# Patient Record
Sex: Male | Born: 1945 | Race: White | Hispanic: No | Marital: Married | Smoking: Current every day smoker
Health system: Southern US, Community
[De-identification: ages and names within clinical notes are randomized; demographics above are authoritative.]

## PROBLEM LIST (undated history)

## (undated) DIAGNOSIS — R410 Disorientation, unspecified: Secondary | ICD-10-CM

## (undated) DIAGNOSIS — R0600 Dyspnea, unspecified: Secondary | ICD-10-CM

## (undated) DIAGNOSIS — I10 Essential (primary) hypertension: Secondary | ICD-10-CM

## (undated) DIAGNOSIS — R0609 Other forms of dyspnea: Secondary | ICD-10-CM

## (undated) DIAGNOSIS — C61 Malignant neoplasm of prostate: Secondary | ICD-10-CM

## (undated) DIAGNOSIS — K219 Gastro-esophageal reflux disease without esophagitis: Secondary | ICD-10-CM

## (undated) DIAGNOSIS — J189 Pneumonia, unspecified organism: Secondary | ICD-10-CM

## (undated) HISTORY — PX: TONSILLECTOMY: SUR1361

## (undated) HISTORY — PX: PROSTATE SURGERY: SHX751

---

## 1998-06-27 ENCOUNTER — Emergency Department (HOSPITAL_COMMUNITY): Admission: EM | Admit: 1998-06-27 | Discharge: 1998-06-27 | Payer: Self-pay | Admitting: Emergency Medicine

## 1998-06-27 ENCOUNTER — Encounter: Payer: Self-pay | Admitting: Emergency Medicine

## 1998-07-17 ENCOUNTER — Emergency Department (HOSPITAL_COMMUNITY): Admission: EM | Admit: 1998-07-17 | Discharge: 1998-07-17 | Payer: Self-pay | Admitting: Emergency Medicine

## 1999-01-18 ENCOUNTER — Emergency Department (HOSPITAL_COMMUNITY): Admission: EM | Admit: 1999-01-18 | Discharge: 1999-01-18 | Payer: Self-pay | Admitting: Emergency Medicine

## 2000-11-05 ENCOUNTER — Other Ambulatory Visit: Admission: RE | Admit: 2000-11-05 | Discharge: 2000-11-05 | Payer: Self-pay | Admitting: *Deleted

## 2001-09-16 ENCOUNTER — Encounter: Admission: RE | Admit: 2001-09-16 | Discharge: 2001-09-16 | Payer: Self-pay | Admitting: Family Medicine

## 2001-09-16 ENCOUNTER — Encounter: Payer: Self-pay | Admitting: Family Medicine

## 2002-06-13 ENCOUNTER — Ambulatory Visit (HOSPITAL_COMMUNITY): Admission: RE | Admit: 2002-06-13 | Discharge: 2002-06-13 | Payer: Self-pay | Admitting: Emergency Medicine

## 2002-06-13 ENCOUNTER — Encounter: Payer: Self-pay | Admitting: Emergency Medicine

## 2002-06-13 ENCOUNTER — Emergency Department (HOSPITAL_COMMUNITY): Admission: EM | Admit: 2002-06-13 | Discharge: 2002-06-13 | Payer: Self-pay | Admitting: Emergency Medicine

## 2003-03-29 ENCOUNTER — Emergency Department (HOSPITAL_COMMUNITY): Admission: EM | Admit: 2003-03-29 | Discharge: 2003-03-30 | Payer: Self-pay | Admitting: Emergency Medicine

## 2006-09-29 HISTORY — PX: CATARACT EXTRACTION W/ INTRAOCULAR LENS  IMPLANT, BILATERAL: SHX1307

## 2006-12-15 ENCOUNTER — Ambulatory Visit (HOSPITAL_COMMUNITY): Admission: RE | Admit: 2006-12-15 | Discharge: 2006-12-15 | Payer: Self-pay | Admitting: Family Medicine

## 2008-01-03 ENCOUNTER — Encounter: Admission: RE | Admit: 2008-01-03 | Discharge: 2008-01-03 | Payer: Self-pay | Admitting: Gastroenterology

## 2008-09-29 HISTORY — PX: HYDROCELE EXCISION / REPAIR: SUR1145

## 2009-07-29 ENCOUNTER — Emergency Department (HOSPITAL_COMMUNITY): Admission: EM | Admit: 2009-07-29 | Discharge: 2009-07-29 | Payer: Self-pay | Admitting: Emergency Medicine

## 2011-02-06 ENCOUNTER — Other Ambulatory Visit: Payer: Self-pay | Admitting: Family Medicine

## 2011-02-06 ENCOUNTER — Ambulatory Visit
Admission: RE | Admit: 2011-02-06 | Discharge: 2011-02-06 | Disposition: A | Discharge status: Home / Self Care | Payer: Medicare Other | Source: Ambulatory Visit | Attending: Family Medicine | Admitting: Family Medicine

## 2011-02-06 DIAGNOSIS — R140 Abdominal distension (gaseous): Secondary | ICD-10-CM

## 2011-02-06 DIAGNOSIS — R6881 Early satiety: Secondary | ICD-10-CM

## 2011-02-06 MED ORDER — IOHEXOL 300 MG/ML  SOLN
100.0000 mL | Freq: Once | INTRAMUSCULAR | Status: AC | PRN
  Administered 2011-02-06: 100 mL via INTRAVENOUS

## 2011-07-20 ENCOUNTER — Emergency Department (HOSPITAL_COMMUNITY): Payer: Medicare Other

## 2011-07-20 ENCOUNTER — Emergency Department (HOSPITAL_COMMUNITY)
Admission: EM | Admit: 2011-07-20 | Discharge: 2011-07-20 | Disposition: A | Discharge status: Home / Self Care | Payer: Medicare Other | Attending: Emergency Medicine | Admitting: Emergency Medicine

## 2011-07-20 DIAGNOSIS — K219 Gastro-esophageal reflux disease without esophagitis: Secondary | ICD-10-CM | POA: Insufficient documentation

## 2011-07-20 DIAGNOSIS — M109 Gout, unspecified: Secondary | ICD-10-CM | POA: Insufficient documentation

## 2011-07-20 DIAGNOSIS — R1013 Epigastric pain: Secondary | ICD-10-CM | POA: Insufficient documentation

## 2011-07-20 LAB — COMPREHENSIVE METABOLIC PANEL
ALT: 30 U/L (ref 0–53)
CO2: 24 mEq/L (ref 19–32)
Calcium: 10.3 mg/dL (ref 8.4–10.5)
Chloride: 99 mEq/L (ref 96–112)
Creatinine, Ser: 0.83 mg/dL (ref 0.50–1.35)
GFR calc Af Amer: >90 mL/min (ref >90)
GFR calc non Af Amer: 90 mL/min — ABNORMAL LOW (ref >90)
Glucose, Bld: 181 mg/dL — ABNORMAL HIGH (ref 70–99)
Sodium: 138 mEq/L (ref 135–145)
Total Bilirubin: 0.6 mg/dL (ref 0.3–1.2)

## 2011-07-20 LAB — DIFFERENTIAL
Basophils Absolute: 0.1 10*3/uL (ref 0.0–0.1)
Basophils Relative: 1 % (ref 0–1)
Eosinophils Relative: 1 % (ref 0–5)
Monocytes Absolute: 0.9 10*3/uL (ref 0.1–1.0)

## 2011-07-20 LAB — CBC
MCH: 36.6 pg — ABNORMAL HIGH (ref 26.0–34.0)
MCHC: 35.9 g/dL (ref 30.0–36.0)
RDW: 12.5 % (ref 11.5–15.5)

## 2011-07-20 LAB — URINALYSIS, ROUTINE W REFLEX MICROSCOPIC
Glucose, UA: NEGATIVE mg/dL
Hgb urine dipstick: NEGATIVE
Ketones, ur: NEGATIVE mg/dL
Protein, ur: NEGATIVE mg/dL
Urobilinogen, UA: 0.2 mg/dL (ref 0.0–1.0)

## 2011-07-20 LAB — PROTIME-INR: Prothrombin Time: 14.4 seconds (ref 11.6–15.2)

## 2011-07-20 LAB — TROPONIN I: Troponin I: <0.3 ng/mL (ref <0.30)

## 2011-07-20 MED ORDER — IOHEXOL 300 MG/ML  SOLN
100.0000 mL | Freq: Once | INTRAMUSCULAR | Status: AC | PRN
  Administered 2011-07-20: 100 mL via INTRAVENOUS

## 2011-10-08 ENCOUNTER — Ambulatory Visit (INDEPENDENT_AMBULATORY_CARE_PROVIDER_SITE_OTHER): Payer: Medicare Other | Admitting: General Surgery

## 2011-10-08 ENCOUNTER — Encounter (INDEPENDENT_AMBULATORY_CARE_PROVIDER_SITE_OTHER): Payer: Self-pay | Admitting: General Surgery

## 2011-10-08 VITALS — BP 142/92 | HR 70 | Temp 98.6°F | Resp 16 | Ht 68.0 in | Wt 167.4 lb

## 2011-10-08 DIAGNOSIS — K4090 Unilateral inguinal hernia, without obstruction or gangrene, not specified as recurrent: Secondary | ICD-10-CM | POA: Insufficient documentation

## 2011-10-08 NOTE — Progress Notes (Signed)
Patient ID: Gary [name], male   DOB: Mar 18, 1946, 66 y.o.   MRN: [id number]  Chief Complaint  Patient presents with  . Inguinal Hernia    right    HPI Gary [name] is a 66 y.o. male.   HPIPatient has a one-year history of right inguinal hernia. Sometime ago, he had evaluated at [facility] in [place]. He did not want to have surgery at that time. The hernia goes in and out during the day. He does goes back and spontaneously at night. He has mild discomfort in the area but no pain. He has had no change in bowel or bladder habits. PRS to see him by Dr. Andrey Campanile regarding consideration for repair  Past Medical History  Diagnosis Date  . Cancer     prostate    Past Surgical History  Procedure Date  . Prostate surgery     History reviewed. No pertinent family history.  Social History History  Substance Use Topics  . Smoking status: Current Everyday Smoker  . Smokeless tobacco: Not on file  . Alcohol Use: 3.6 oz/week    6 Cans of beer per week    No Known Allergies  No current outpatient prescriptions on file.    Review of Systems Review of Systems  Constitutional: Negative for fever, chills and unexpected weight change.  HENT: Negative for hearing loss, congestion, sore throat, trouble swallowing and voice change.   Eyes: Negative for visual disturbance.  Respiratory: Negative for cough and wheezing.   Cardiovascular: Negative for chest pain, palpitations and leg swelling.  Gastrointestinal: Negative for nausea, vomiting, abdominal pain, diarrhea, constipation, blood in stool, abdominal distention, anal bleeding and rectal pain.       Right inguinal hernia  Genitourinary: Negative for hematuria and difficulty urinating.       Status post prostatectomy  Musculoskeletal: Negative for arthralgias.  Skin: Negative for rash and wound.  Neurological: Negative for seizures, syncope, weakness and headaches.  Hematological: Negative for adenopathy. Does not bruise/bleed  easily.  Psychiatric/Behavioral: Negative for confusion.    Blood pressure 142/92, pulse 70, temperature 98.6 F (37 C), temperature source Temporal, resp. rate 16, height 5\' 8"  (1.727 m), weight 167 lb 6 oz (75.921 kg).  Physical Exam Physical Exam  Constitutional: He appears well-developed and well-nourished. No distress.  HENT:  Head: Normocephalic and atraumatic.  Eyes: Conjunctivae and EOM are normal. Pupils are equal, round, and reactive to light.  Neck: Normal range of motion. Neck supple.  Cardiovascular: Normal rate, regular rhythm and normal heart sounds.   Pulmonary/Chest: Effort normal and breath sounds normal. No respiratory distress. He has no wheezes. He has no rales.  Abdominal: Soft. Bowel sounds are normal. He exhibits no distension. There is no tenderness. There is no rebound and no guarding.       Healed lower midline incision, right inguinal hernia is present. This reduces without pain.it does not go into the scrotum. Both testes are descended without swelling. There is no left hernia    Data Reviewed    Assessment    Right inguinal hernia    Plan    I have offered right inguinal hernia repair with mesh. Procedure, risks, and benefits of the procedure were discussed with the patient. We discussed recurrence rates and other questions are answered. I encouraged him to quit smoking. He would like to schedule surgery.       Cyenna Rebello E 10/08/2011, 10:51 AM

## 2011-11-24 ENCOUNTER — Encounter (HOSPITAL_COMMUNITY): Payer: Self-pay | Admitting: Pharmacy Technician

## 2011-11-27 NOTE — Pre-Procedure Instructions (Deleted)
20 Gary [name]  11/27/2011   Your procedure is scheduled on:  Monday December 08, 2011 at 0730 am.  Report to [facility] at 0530 AM.  Call this number if you have problems the morning of surgery: [phone]   Remember:   Do not eat food:After Midnight.  May have clear liquids: up to 4 Hours before arrival until 0130 am.  Clear liquids include soda, tea, black coffee, apple or grape juice, broth.  Take these medicines the morning of surgery with A SIP OF WATER: Omeprazole   Do not wear jewelry, make-up or nail polish.  Do not wear lotions, powders, or perfumes. You may wear deodorant.  Do not shave 48 hours prior to surgery.  Do not bring valuables to the hospital.  Contacts, dentures or bridgework may not be worn into surgery.  Leave suitcase in the car. After surgery it may be brought to your room.  For patients admitted to the hospital, checkout time is 11:00 AM the day of discharge.   Patients discharged the day of surgery will not be allowed to drive home.  Name and phone number of your driver:   Special Instructions: CHG Shower Use Special Wash: 1/2 bottle night before surgery and 1/2 bottle morning of surgery.   Please read over the following fact sheets that you were given: Pain Booklet, Coughing and Deep Breathing and Surgical Site Infection Prevention

## 2011-11-28 ENCOUNTER — Encounter (HOSPITAL_COMMUNITY): Payer: Self-pay

## 2011-11-28 ENCOUNTER — Encounter (HOSPITAL_COMMUNITY)
Admission: RE | Admit: 2011-11-28 | Discharge: 2011-11-28 | Disposition: A | Discharge status: Home / Self Care | Payer: Medicare Other | Source: Ambulatory Visit | Attending: General Surgery | Admitting: General Surgery

## 2011-11-28 HISTORY — DX: Essential (primary) hypertension: I10

## 2011-11-28 HISTORY — DX: Pneumonia, unspecified organism: J18.9

## 2011-11-28 HISTORY — DX: Gastro-esophageal reflux disease without esophagitis: K21.9

## 2011-11-28 LAB — CBC
HCT: 46.8 % (ref 39.0–52.0)
Hemoglobin: 16.9 g/dL (ref 13.0–17.0)
MCH: 34.9 pg — ABNORMAL HIGH (ref 26.0–34.0)
MCV: 96.7 fL (ref 78.0–100.0)
RBC: 4.84 MIL/uL (ref 4.22–5.81)
WBC: 5.7 10*3/uL (ref 4.0–10.5)

## 2011-11-28 LAB — DIFFERENTIAL
Basophils Absolute: 0.1 10*3/uL (ref 0.0–0.1)
Basophils Relative: 2 % — ABNORMAL HIGH (ref 0–1)
Eosinophils Relative: 6 % — ABNORMAL HIGH (ref 0–5)
Lymphocytes Relative: 28 % (ref 12–46)
Monocytes Absolute: 0.8 10*3/uL (ref 0.1–1.0)
Neutro Abs: 2.9 10*3/uL (ref 1.7–7.7)

## 2011-11-28 LAB — COMPREHENSIVE METABOLIC PANEL
AST: 38 U/L — ABNORMAL HIGH (ref 0–37)
CO2: 25 mEq/L (ref 19–32)
Calcium: 10.4 mg/dL (ref 8.4–10.5)
Chloride: 99 mEq/L (ref 96–112)
Creatinine, Ser: 0.95 mg/dL (ref 0.50–1.35)
GFR calc Af Amer: >90 mL/min (ref >90)
GFR calc non Af Amer: 85 mL/min — ABNORMAL LOW (ref >90)
Glucose, Bld: 108 mg/dL — ABNORMAL HIGH (ref 70–99)
Total Bilirubin: 0.9 mg/dL (ref 0.3–1.2)

## 2011-11-28 LAB — PROTIME-INR: Prothrombin Time: 13.4 seconds (ref 11.6–15.2)

## 2011-11-28 LAB — SURGICAL PCR SCREEN: Staphylococcus aureus: NEGATIVE

## 2011-11-28 NOTE — Pre-Procedure Instructions (Addendum)
Gary [name]  11/28/2011   Your procedure is scheduled on:3.11.13  Report to [facility] at 530 AM.  Call this number if you have problems the morning of surgery: [phone]   Remember:   Do not eat food:After Midnight.  May have clear liquids: up to 4 Hours before arrival. 130 am  Clear liquids include soda, tea, black coffee, apple or grape juice, broth.  Take these medicines the morning of surgery with A SIP OF WATER:omeprazole STOP aspirin , vit e on the 4th of march   Do not wear jewelry, make-up or nail polish.  Do not wear lotions, powders, or perfumes. You may wear deodorant.  Do not shave 48 hours prior to surgery.  Do not bring valuables to the hospital.  Contacts, dentures or bridgework may not be worn into surgery.  Leave suitcase in the car. After surgery it may be brought to your room.  For patients admitted to the hospital, checkout time is 11:00 AM the day of discharge.   Patients discharged the day of surgery will not be allowed to drive home.  Name and phone number of your driver: [name] [phone]  Cell [phone] work  Special Instructions: CHG Shower Use Special Wash: 1/2 bottle night before surgery and 1/2 bottle morning of surgery.   Please read over the following fact sheets that you were given: Pain Booklet, Coughing and Deep Breathing, MRSA Information and Surgical Site Infection Prevention

## 2011-11-28 NOTE — Progress Notes (Signed)
Stress test done 10-12 yrs ago . Normal  Due to indigestion. Unable to remember where.? wl

## 2011-12-07 MED ORDER — CEFAZOLIN SODIUM-DEXTROSE 2-3 GM-% IV SOLR
2.0000 g | INTRAVENOUS | Status: AC
  Administered 2011-12-08: 2 g via INTRAVENOUS
  Filled 2011-12-07: qty 50

## 2011-12-08 ENCOUNTER — Ambulatory Visit (HOSPITAL_COMMUNITY): Payer: Medicare Other

## 2011-12-08 ENCOUNTER — Ambulatory Visit (HOSPITAL_COMMUNITY)
Admission: RE | Admit: 2011-12-08 | Discharge: 2011-12-08 | Disposition: A | Discharge status: Home / Self Care | Payer: Medicare Other | Source: Ambulatory Visit | Attending: General Surgery | Admitting: General Surgery

## 2011-12-08 ENCOUNTER — Encounter (HOSPITAL_COMMUNITY): Payer: Self-pay | Admitting: Certified Registered"

## 2011-12-08 ENCOUNTER — Encounter (HOSPITAL_COMMUNITY): Payer: Self-pay | Admitting: *Deleted

## 2011-12-08 ENCOUNTER — Ambulatory Visit (HOSPITAL_COMMUNITY): Payer: Medicare Other | Admitting: Certified Registered"

## 2011-12-08 ENCOUNTER — Encounter (HOSPITAL_COMMUNITY)
Admission: RE | Disposition: A | Discharge status: Home / Self Care | Payer: Self-pay | Source: Ambulatory Visit | Attending: General Surgery

## 2011-12-08 DIAGNOSIS — F17200 Nicotine dependence, unspecified, uncomplicated: Secondary | ICD-10-CM | POA: Insufficient documentation

## 2011-12-08 DIAGNOSIS — K219 Gastro-esophageal reflux disease without esophagitis: Secondary | ICD-10-CM | POA: Insufficient documentation

## 2011-12-08 DIAGNOSIS — I10 Essential (primary) hypertension: Secondary | ICD-10-CM | POA: Insufficient documentation

## 2011-12-08 DIAGNOSIS — K4090 Unilateral inguinal hernia, without obstruction or gangrene, not specified as recurrent: Secondary | ICD-10-CM

## 2011-12-08 DIAGNOSIS — Z01812 Encounter for preprocedural laboratory examination: Secondary | ICD-10-CM | POA: Insufficient documentation

## 2011-12-08 HISTORY — PX: INGUINAL HERNIA REPAIR: SHX194

## 2011-12-08 SURGERY — REPAIR, HERNIA, INGUINAL, ADULT
Anesthesia: General | Site: Groin | Laterality: Right | Wound class: Clean

## 2011-12-08 MED ORDER — BUPIVACAINE HCL (PF) 0.25 % IJ SOLN
INTRAMUSCULAR | Status: DC | PRN
  Administered 2011-12-08: 20 mL

## 2011-12-08 MED ORDER — MIDAZOLAM HCL 5 MG/5ML IJ SOLN
INTRAMUSCULAR | Status: DC | PRN
  Administered 2011-12-08: 2 mg via INTRAVENOUS

## 2011-12-08 MED ORDER — LACTATED RINGERS IV SOLN
INTRAVENOUS | Status: DC | PRN
  Administered 2011-12-08: 07:00:00 via INTRAVENOUS

## 2011-12-08 MED ORDER — ACETAMINOPHEN 10 MG/ML IV SOLN
INTRAVENOUS | Status: AC
  Filled 2011-12-08: qty 100

## 2011-12-08 MED ORDER — HYDROMORPHONE HCL PF 1 MG/ML IJ SOLN
0.2500 mg | INTRAMUSCULAR | Status: DC | PRN
Start: 2011-12-08 — End: 2011-12-08

## 2011-12-08 MED ORDER — FENTANYL CITRATE 0.05 MG/ML IJ SOLN
INTRAMUSCULAR | Status: DC | PRN
  Administered 2011-12-08: 75 ug via INTRAVENOUS
  Administered 2011-12-08: 50 ug via INTRAVENOUS

## 2011-12-08 MED ORDER — PROPOFOL 10 MG/ML IV EMUL
INTRAVENOUS | Status: DC | PRN
  Administered 2011-12-08: 200 mg via INTRAVENOUS

## 2011-12-08 MED ORDER — OXYCODONE-ACETAMINOPHEN 5-325 MG PO TABS: 1.0000 | ORAL_TABLET | Freq: Four times a day (QID) | ORAL | Status: AC | PRN

## 2011-12-08 MED ORDER — ONDANSETRON HCL 4 MG/2ML IJ SOLN
INTRAMUSCULAR | Status: DC | PRN
  Administered 2011-12-08: 4 mg via INTRAVENOUS

## 2011-12-08 MED ORDER — PHENYLEPHRINE HCL 10 MG/ML IJ SOLN
INTRAMUSCULAR | Status: DC | PRN
  Administered 2011-12-08 (×6): 0.1 mg via INTRAVENOUS

## 2011-12-08 MED ORDER — DEXAMETHASONE SODIUM PHOSPHATE 4 MG/ML IJ SOLN
INTRAMUSCULAR | Status: DC | PRN
  Administered 2011-12-08: 4 mg via INTRAVENOUS

## 2011-12-08 MED ORDER — 0.9 % SODIUM CHLORIDE (POUR BTL) OPTIME
TOPICAL | Status: DC | PRN
  Administered 2011-12-08: 1000 mL

## 2011-12-08 SURGICAL SUPPLY — 47 items
BLADE SURG 10 STRL SS (BLADE) ×2 IMPLANT
BLADE SURG 15 STRL LF DISP TIS (BLADE) ×1 IMPLANT
BLADE SURG 15 STRL SS (BLADE) ×1
BLADE SURG ROTATE 9660 (MISCELLANEOUS) ×2 IMPLANT
CANISTER SUCTION 2500CC (MISCELLANEOUS) ×2 IMPLANT
CHLORAPREP W/TINT 26ML (MISCELLANEOUS) ×2 IMPLANT
CLOTH BEACON ORANGE TIMEOUT ST (SAFETY) ×4 IMPLANT
COVER SURGICAL LIGHT HANDLE (MISCELLANEOUS) ×2 IMPLANT
DECANTER SPIKE VIAL GLASS SM (MISCELLANEOUS) ×2 IMPLANT
DERMABOND ADVANCED (GAUZE/BANDAGES/DRESSINGS) ×1
DERMABOND ADVANCED .7 DNX12 (GAUZE/BANDAGES/DRESSINGS) ×1 IMPLANT
DRAIN PENROSE 1/2X12 LTX STRL (WOUND CARE) ×2 IMPLANT
DRAPE PED LAPAROTOMY (DRAPES) ×2 IMPLANT
DRAPE UTILITY 15X26 W/TAPE STR (DRAPE) ×4 IMPLANT
ELECT CAUTERY BLADE 6.4 (BLADE) ×2 IMPLANT
ELECT REM PT RETURN 9FT ADLT (ELECTROSURGICAL) ×2
ELECTRODE REM PT RTRN 9FT ADLT (ELECTROSURGICAL) ×1 IMPLANT
GLOVE BIO SURGEON STRL SZ8 (GLOVE) ×2 IMPLANT
GLOVE BIOGEL PI IND STRL 8 (GLOVE) ×1 IMPLANT
GLOVE BIOGEL PI INDICATOR 8 (GLOVE) ×1
GOWN PREVENTION PLUS XLARGE (GOWN DISPOSABLE) ×2 IMPLANT
GOWN STRL NON-REIN LRG LVL3 (GOWN DISPOSABLE) ×2 IMPLANT
KIT BASIN OR (CUSTOM PROCEDURE TRAY) ×2 IMPLANT
KIT ROOM TURNOVER OR (KITS) ×2 IMPLANT
MESH HERNIA 3X6 (Mesh General) ×2 IMPLANT
NEEDLE HYPO 25GX1X1/2 BEV (NEEDLE) ×2 IMPLANT
NS IRRIG 1000ML POUR BTL (IV SOLUTION) ×2 IMPLANT
PACK SURGICAL SETUP 50X90 (CUSTOM PROCEDURE TRAY) ×2 IMPLANT
PAD ARMBOARD 7.5X6 YLW CONV (MISCELLANEOUS) ×4 IMPLANT
PENCIL BUTTON HOLSTER BLD 10FT (ELECTRODE) ×2 IMPLANT
PLUG ATRIUM AND PATCH X LRG (MISCELLANEOUS) IMPLANT
SPECIMEN JAR SMALL (MISCELLANEOUS) IMPLANT
SPONGE LAP 18X18 X RAY DECT (DISPOSABLE) ×2 IMPLANT
SUT MNCRL AB 4-0 PS2 18 (SUTURE) ×2 IMPLANT
SUT PROLENE 2 0 CT2 30 (SUTURE) ×6 IMPLANT
SUT VIC AB 2-0 SH 27 (SUTURE) ×2
SUT VIC AB 2-0 SH 27X BRD (SUTURE) ×2 IMPLANT
SUT VIC AB 3-0 SH 27 (SUTURE) ×1
SUT VIC AB 3-0 SH 27X BRD (SUTURE) ×1 IMPLANT
SUT VICRYL AB 3 0 TIES (SUTURE) IMPLANT
SYR BULB IRRIGATION 50ML (SYRINGE) ×2 IMPLANT
SYR CONTROL 10ML LL (SYRINGE) ×2 IMPLANT
TOWEL OR 17X24 6PK STRL BLUE (TOWEL DISPOSABLE) ×2 IMPLANT
TOWEL OR 17X26 10 PK STRL BLUE (TOWEL DISPOSABLE) ×2 IMPLANT
TUBE CONNECTING 12X1/4 (SUCTIONS) ×2 IMPLANT
WATER STERILE IRR 1000ML POUR (IV SOLUTION) IMPLANT
YANKAUER SUCT BULB TIP NO VENT (SUCTIONS) ×2 IMPLANT

## 2011-12-08 NOTE — Addendum Note (Signed)
Addendum  created 12/08/11 0944 by Wilder Glade, CRNA   Modules edited:Anesthesia Events

## 2011-12-08 NOTE — Anesthesia Preprocedure Evaluation (Signed)
Anesthesia Evaluation  Patient identified by MRN, date of birth, ID band Patient awake    Reviewed: Allergy & Precautions, H&P , NPO status , Patient's Chart, lab work & pertinent test results  Airway Mallampati: II TM Distance: >3 FB Neck ROM: Full    Dental No notable dental hx. (+) Teeth Intact and Dental Advisory Given   Pulmonary neg pulmonary ROS,  breath sounds clear to auscultation  Pulmonary exam normal       Cardiovascular hypertension, On Medications Rhythm:Regular Rate:Normal     Neuro/Psych negative neurological ROS  negative psych ROS   GI/Hepatic Neg liver ROS, GERD-  Medicated and Controlled,  Endo/Other  negative endocrine ROS  Renal/GU negative Renal ROS  negative genitourinary   Musculoskeletal   Abdominal   Peds  Hematology negative hematology ROS (+)   Anesthesia Other Findings   Reproductive/Obstetrics negative OB ROS                           Anesthesia Physical Anesthesia Plan  ASA: II  Anesthesia Plan: General   Post-op Pain Management:    Induction: Intravenous  Airway Management Planned: LMA  Additional Equipment:   Intra-op Plan:   Post-operative Plan: Extubation in OR  Informed Consent: I have reviewed the patients History and Physical, chart, labs and discussed the procedure including the risks, benefits and alternatives for the proposed anesthesia with the patient or authorized representative who has indicated his/her understanding and acceptance.   Dental advisory given  Plan Discussed with: CRNA  Anesthesia Plan Comments:         Anesthesia Quick Evaluation  

## 2011-12-08 NOTE — H&P (Signed)
  HPI  Gary [name] is a 66 y.o. male.  HPIPatient has a one-year history of right inguinal hernia. Sometime ago, he had evaluated at [facility] in [place]. He did not want to have surgery at that time. The hernia goes in and out during the day. He does goes back and spontaneously at night. He has mild discomfort in the area but no pain. He has had no change in bowel or bladder habits. PRS to see him by Dr. Andrey Campanile regarding consideration for repair  Past Medical History   Diagnosis  Date   .  Cancer      prostate    Past Surgical History   Procedure  Date   .  Prostate surgery     History reviewed. No pertinent family history.  Social History  History   Substance Use Topics   .  Smoking status:  Current Everyday Smoker   .  Smokeless tobacco:  Not on file   .  Alcohol Use:  3.6 oz/week     6 Cans of beer per week    No Known Allergies  No current outpatient prescriptions on file.    Review of Systems  Review of Systems  Constitutional: Negative for fever, chills and unexpected weight change.  HENT: Negative for hearing loss, congestion, sore throat, trouble swallowing and voice change.  Eyes: Negative for visual disturbance.  Respiratory: Negative for cough and wheezing.  Cardiovascular: Negative for chest pain, palpitations and leg swelling.  Gastrointestinal: Negative for nausea, vomiting, abdominal pain, diarrhea, constipation, blood in stool, abdominal distention, anal bleeding and rectal pain.  Right inguinal hernia  Genitourinary: Negative for hematuria and difficulty urinating.  Status post prostatectomy  Musculoskeletal: Negative for arthralgias.  Skin: Negative for rash and wound.  Neurological: Negative for seizures, syncope, weakness and headaches.  Hematological: Negative for adenopathy. Does not bruise/bleed easily.  Psychiatric/Behavioral: Negative for confusion.   Blood pressure 142/92, pulse 70, temperature 98.6 F (37 C), temperature source Temporal,  resp. rate 16, height 5\' 8"  (1.727 m), weight 167 lb 6 oz (75.921 kg).  Physical Exam  Physical Exam  Constitutional: He appears well-developed and well-nourished. No distress.  HENT:  Head: Normocephalic and atraumatic.  Eyes: Conjunctivae and EOM are normal. Pupils are equal, round, and reactive to light.  Neck: Normal range of motion. Neck supple.  Cardiovascular: Normal rate, regular rhythm and normal heart sounds.  Pulmonary/Chest: Effort normal and breath sounds normal. No respiratory distress. He has no wheezes. He has no rales.  Abdominal: Soft. Bowel sounds are normal. He exhibits no distension. There is no tenderness. There is no rebound and no guarding.  Healed lower midline incision, right inguinal hernia is present. This reduces without pain.it does not go into the scrotum. Both testes are descended without swelling. There is no left hernia   Data Reviewed  Assessment   Right inguinal hernia   Plan   I have offered right inguinal hernia repair with mesh. Procedure, risks, and benefits of the procedure were discussed with the patient. We discussed recurrence rates and other questions are answered. I encouraged him to quit smoking. He would like to schedule surgery.    Violeta Gelinas, MD, MPH, FACS Pager: [phone]

## 2011-12-08 NOTE — Preoperative (Signed)
Beta Blockers   Reason not to administer Beta Blockers:Not Applicable 

## 2011-12-08 NOTE — Addendum Note (Signed)
Addendum  created 12/08/11 0941 by Wilder Glade, CRNA   Modules edited:Anesthesia Events

## 2011-12-08 NOTE — Anesthesia Postprocedure Evaluation (Signed)
  Anesthesia Post-op Note  Patient: Gary [name]  Procedure(s) Performed: Procedure(s) (LRB): HERNIA REPAIR INGUINAL ADULT (Right) INSERTION OF MESH (Right)  Patient Location: PACU  Anesthesia Type: General  Level of Consciousness: awake, alert  and oriented  Airway and Oxygen Therapy: Patient Spontanous Breathing  Post-op Pain: none  Post-op Assessment: Post-op Vital signs reviewed, Patient's Cardiovascular Status Stable, Respiratory Function Stable and Patent Airway  Post-op Vital Signs: Reviewed and stable  Complications: No apparent anesthesia complications

## 2011-12-08 NOTE — Op Note (Signed)
12/08/2011  8:41 AM  PATIENT:  Gary [name]  66 y.o. male  PRE-OPERATIVE DIAGNOSIS:  right inguinal hernia  POST-OPERATIVE DIAGNOSIS:  right inguinal hernia  PROCEDURE:  Procedure(s): HERNIA REPAIR INGUINAL ADULT INSERTION OF MESH  SURGEON:  Surgeon(s): Liz Malady, MD  PHYSICIAN ASSISTANT:   ASSISTANTS: none   ANESTHESIA:   general  EBL:     BLOOD ADMINISTERED:none  DRAINS: none   SPECIMEN:  No Specimen  DISPOSITION OF SPECIMEN:  N/A  COUNTS:  YES  DICTATION: .Dragon Dictation patient presents for repair of right inguinal hernia. He was identified in the preop holding area. His site was marked. He received intravenous antibiotics. He was brought to the operating room and general anesthesia with laryngeal mask airway was administered by the anesthesia staff. His abdomen and groins were prepped and draped in sterile fashion. We did time out procedure. Local anesthetic was injected towards the right anterior superior iliac spine and along the planned line of right groin incision. Right groin incision was made. Subcutaneous tissues were dissected down through Scarpa's fascia. Hemostasis was obtained with Bovie cautery. External oblique fascia was exposed. This was divided laterally in the division was continued sharply through the external ring. Superior leaflet of the fascia was dissected free off the transversalis. Inferior leaflet was dissected down revealing the shelving edge of the inguinal ligament. Cord structures were encircled with a Penrose drain. Flow was inspected there was no direct hernia. Cord structures were then dissected revealing a moderate-sized indirect hernia sac. The sac was dissected free from the cord structures. It was then high ligated with 2-0 Vicryl. It was excised and discarded. A small cord lipoma was also noted which was removed with cautery. Next the hernia repair was completed with a polypropylene mesh cut to a keyhole shape. This was tacked to  the tissues over the pubic tubercle medially with 2-0 Prolene. It was attached to the shelving edge of inguinal ligament inferiorly with running 2-0 Prolene. Superior portion was tacked with interrupted 2-0 Prolene again the tissues over the pubic tubercle and then along the transversalis. The 2 leaflets of the mesh were rejoined behind the cord and tacked together and to underlying musculature with 2-0 Prolene suture again an interrupted fashion. The aperture in the mesh was adjusted to the admitted the tip of the fifth digit. Cord structures were viable and nonedematous. Area was irrigated. Meticulous hemostasis was ensured. Additional local anesthetic was injected. External oblique fascia was closed with running 2-0 Vicryl suture. Subcutaneous tissues were irrigated and then approximated with interrupted 3-0 Vicryl sutures. Skin was closed with running 4-0 Monocryl subcuticular stitch followed by Dermabond. Sponge needle and as we counts were all correct. Patient tolerated procedure well without apparent complication was taken recovery in stable condition. At the completion of the procedure, his right testicle was returned to anatomic position in the scrotum.  PATIENT DISPOSITION:  PACU - hemodynamically stable.   Delay start of Pharmacological VTE agent (>24hrs) due to surgical blood loss or risk of bleeding:  no  Violeta Gelinas, MD, MPH, FACS Pager: [phone]  3/11/20138:41 AM

## 2011-12-08 NOTE — Interval H&P Note (Signed)
History and Physical Interval Note:  12/08/2011 7:12 AM  Gary [name]  has presented today for surgery, with the diagnosis of right inguinal hernia  The various methods of treatment have been discussed with the patient and family. After consideration of risks, benefits and other options for treatment, the patient has consented to  Procedure(s) (LRB): HERNIA REPAIR INGUINAL ADULT (Right) INSERTION OF MESH (Right) as a surgical intervention .  The patients' history has been reviewed, patient re-examined, no change in status, stable for surgery.  I have reviewed the patients' chart and labs.  Questions were answered to the patient's satisfaction.     Shamila Lerch E

## 2011-12-08 NOTE — Addendum Note (Signed)
Addendum  created 12/08/11 0953 by Arretta Toenjes G Rajanee Schuelke, CRNA   Modules edited:Anesthesia Responsible Staff    

## 2011-12-08 NOTE — Addendum Note (Signed)
Addendum  created 12/08/11 0953 by Wilder Glade, CRNA   Modules edited:Anesthesia Responsible Staff

## 2011-12-08 NOTE — Transfer of Care (Signed)
Immediate Anesthesia Transfer of Care Note  Patient: Gary [name]  Procedure(s) Performed: Procedure(s) (LRB): HERNIA REPAIR INGUINAL ADULT (Right) INSERTION OF MESH (Right)  Patient Location: PACU  Anesthesia Type: General  Level of Consciousness: awake, alert , oriented and sedated  Airway & Oxygen Therapy: Patient connected to face mask oxygen  Post-op Assessment: Report given to PACU RN and Post -op Vital signs reviewed and stable  Post vital signs: Reviewed and stable  Complications: No apparent anesthesia complications

## 2011-12-15 ENCOUNTER — Encounter (HOSPITAL_COMMUNITY): Payer: Self-pay | Admitting: General Surgery

## 2011-12-24 ENCOUNTER — Ambulatory Visit (INDEPENDENT_AMBULATORY_CARE_PROVIDER_SITE_OTHER): Payer: Medicare Other | Admitting: General Surgery

## 2011-12-24 ENCOUNTER — Encounter (INDEPENDENT_AMBULATORY_CARE_PROVIDER_SITE_OTHER): Payer: Self-pay | Admitting: General Surgery

## 2011-12-24 VITALS — BP 146/96 | HR 98 | Temp 98.4°F | Resp 18 | Ht 68.0 in | Wt 162.6 lb

## 2011-12-24 DIAGNOSIS — Z9889 Other specified postprocedural states: Secondary | ICD-10-CM

## 2011-12-24 DIAGNOSIS — Z8719 Personal history of other diseases of the digestive system: Secondary | ICD-10-CM

## 2011-12-24 NOTE — Progress Notes (Signed)
Subjective:     Patient ID: Gary [name], male   DOB: Mar 31, 1946, 66 y.o.   MRN: [id number]  HPI  Patient underwent right inguinal hernia repair with mesh on March 11. He is doing well postoperatively. He has had no pain. He is now taking pain medication. Review of Systems     Objective:   Physical Exam Incision is well healed. His hernia repair is intact. Testicles are descended with no edema.    Assessment:     Stable status post right inguinal hernia repair with mesh    Plan:     No lifting for 6 weeks after surgery. Return p.r.n.

## 2012-03-01 ENCOUNTER — Encounter (HOSPITAL_COMMUNITY): Payer: Self-pay | Admitting: Anesthesiology

## 2012-03-01 ENCOUNTER — Inpatient Hospital Stay (HOSPITAL_COMMUNITY)
Admission: EM | Admit: 2012-03-01 | Discharge: 2012-03-03 | DRG: 585 | Disposition: A | Discharge status: Home / Self Care | Payer: Medicare Other | Attending: General Surgery | Admitting: General Surgery

## 2012-03-01 ENCOUNTER — Emergency Department (HOSPITAL_COMMUNITY)
Admission: EM | Admit: 2012-03-01 | Discharge: 2012-03-01 | Disposition: A | Discharge status: Home / Self Care | Payer: Medicare Other | Source: Home / Self Care

## 2012-03-01 ENCOUNTER — Observation Stay (HOSPITAL_COMMUNITY): Payer: Medicare Other

## 2012-03-01 ENCOUNTER — Encounter (HOSPITAL_COMMUNITY): Admission: EM | Disposition: A | Discharge status: Home / Self Care | Payer: Self-pay | Source: Home / Self Care

## 2012-03-01 ENCOUNTER — Observation Stay (HOSPITAL_COMMUNITY): Payer: Medicare Other | Admitting: Anesthesiology

## 2012-03-01 ENCOUNTER — Encounter (HOSPITAL_COMMUNITY): Payer: Self-pay

## 2012-03-01 DIAGNOSIS — F17200 Nicotine dependence, unspecified, uncomplicated: Secondary | ICD-10-CM | POA: Diagnosis present

## 2012-03-01 DIAGNOSIS — N611 Abscess of the breast and nipple: Secondary | ICD-10-CM

## 2012-03-01 DIAGNOSIS — N61 Mastitis without abscess: Principal | ICD-10-CM | POA: Diagnosis present

## 2012-03-01 DIAGNOSIS — Z8546 Personal history of malignant neoplasm of prostate: Secondary | ICD-10-CM

## 2012-03-01 DIAGNOSIS — L7211 Pilar cyst: Secondary | ICD-10-CM | POA: Diagnosis present

## 2012-03-01 HISTORY — PX: IRRIGATION AND DEBRIDEMENT ABSCESS: SHX5252

## 2012-03-01 LAB — CBC
HCT: 46.4 % (ref 39.0–52.0)
HCT: 44.6 % (ref 39.0–52.0)
Hemoglobin: 16.1 g/dL (ref 13.0–17.0)
MCV: 97.1 fL (ref 78.0–100.0)
MCV: 97.4 fL (ref 78.0–100.0)
RBC: 4.78 MIL/uL (ref 4.22–5.81)
RBC: 4.58 MIL/uL (ref 4.22–5.81)
WBC: 9.4 10*3/uL (ref 4.0–10.5)
WBC: 6.5 10*3/uL (ref 4.0–10.5)

## 2012-03-01 LAB — POCT I-STAT, CHEM 8
Calcium, Ion: 1.13 mmol/L (ref 1.12–1.32)
HCT: 51 % (ref 39.0–52.0)
Sodium: 137 mEq/L (ref 135–145)
TCO2: 25 mmol/L (ref 0–100)

## 2012-03-01 LAB — BASIC METABOLIC PANEL
BUN: 5 mg/dL — ABNORMAL LOW (ref 6–23)
CO2: 25 mEq/L (ref 19–32)
Chloride: 96 mEq/L (ref 96–112)
Creatinine, Ser: 0.88 mg/dL (ref 0.50–1.35)

## 2012-03-01 LAB — CREATININE, SERUM
GFR calc Af Amer: >90 mL/min (ref >90)
GFR calc non Af Amer: 90 mL/min — ABNORMAL LOW (ref >90)

## 2012-03-01 SURGERY — IRRIGATION AND DEBRIDEMENT ABSCESS
Anesthesia: General | Site: Breast | Laterality: Right | Wound class: Dirty or Infected

## 2012-03-01 MED ORDER — CEFAZOLIN SODIUM-DEXTROSE 2-3 GM-% IV SOLR
2.0000 g | Freq: Three times a day (TID) | INTRAVENOUS | Status: DC
  Administered 2012-03-01: 2 g via INTRAVENOUS
  Filled 2012-03-01 (×3): qty 50

## 2012-03-01 MED ORDER — LACTATED RINGERS IV SOLN
INTRAVENOUS | Status: DC | PRN
  Administered 2012-03-01: 17:00:00 via INTRAVENOUS

## 2012-03-01 MED ORDER — ASPIRIN EC 81 MG PO TBEC
162.0000 mg | DELAYED_RELEASE_TABLET | Freq: Every day | ORAL | Status: DC
  Filled 2012-03-01: qty 2

## 2012-03-01 MED ORDER — 0.9 % SODIUM CHLORIDE (POUR BTL) OPTIME
TOPICAL | Status: DC | PRN
  Administered 2012-03-01: 1000 mL

## 2012-03-01 MED ORDER — ONDANSETRON HCL 4 MG/2ML IJ SOLN: 4.0000 mg | Freq: Four times a day (QID) | INTRAMUSCULAR | Status: DC | PRN

## 2012-03-01 MED ORDER — HEPARIN SODIUM (PORCINE) 5000 UNIT/ML IJ SOLN
5000.0000 [IU] | Freq: Three times a day (TID) | INTRAMUSCULAR | Status: DC
  Administered 2012-03-02 – 2012-03-03 (×4): 5000 [IU] via SUBCUTANEOUS
  Filled 2012-03-01 (×7): qty 1

## 2012-03-01 MED ORDER — LISINOPRIL 10 MG PO TABS
10.0000 mg | ORAL_TABLET | Freq: Every day | ORAL | Status: DC
  Administered 2012-03-01 – 2012-03-02 (×2): 10 mg via ORAL
  Filled 2012-03-01 (×3): qty 1

## 2012-03-01 MED ORDER — FENTANYL CITRATE 0.05 MG/ML IJ SOLN
INTRAMUSCULAR | Status: DC | PRN
  Administered 2012-03-01: 100 ug via INTRAVENOUS

## 2012-03-01 MED ORDER — PANTOPRAZOLE SODIUM 40 MG PO TBEC
40.0000 mg | DELAYED_RELEASE_TABLET | Freq: Every day | ORAL | Status: DC
  Administered 2012-03-01 – 2012-03-02 (×2): 40 mg via ORAL
  Filled 2012-03-01 (×2): qty 1

## 2012-03-01 MED ORDER — PROPOFOL 10 MG/ML IV EMUL
INTRAVENOUS | Status: DC | PRN
  Administered 2012-03-01: 200 mg via INTRAVENOUS

## 2012-03-01 MED ORDER — MORPHINE SULFATE 2 MG/ML IJ SOLN: 2.0000 mg | INTRAMUSCULAR | Status: DC | PRN

## 2012-03-01 MED ORDER — CEFAZOLIN SODIUM-DEXTROSE 2-3 GM-% IV SOLR
2.0000 g | Freq: Three times a day (TID) | INTRAVENOUS | Status: DC
  Administered 2012-03-01 – 2012-03-03 (×5): 2 g via INTRAVENOUS
  Filled 2012-03-01 (×10): qty 50

## 2012-03-01 MED ORDER — ASPIRIN EC 81 MG PO TBEC
162.0000 mg | DELAYED_RELEASE_TABLET | Freq: Every day | ORAL | Status: DC
  Administered 2012-03-02: 162 mg via ORAL
  Filled 2012-03-01 (×2): qty 2

## 2012-03-01 MED ORDER — SODIUM CHLORIDE 0.9 % IV SOLN: INTRAVENOUS | Status: DC

## 2012-03-01 MED ORDER — CEFAZOLIN SODIUM-DEXTROSE 2-3 GM-% IV SOLR
2.0000 g | INTRAVENOUS | Status: DC
  Filled 2012-03-01: qty 50

## 2012-03-01 MED ORDER — HYDROCODONE-ACETAMINOPHEN 5-325 MG PO TABS
1.0000 | ORAL_TABLET | ORAL | Status: DC | PRN
  Administered 2012-03-02: 2 via ORAL
  Filled 2012-03-01 (×3): qty 2

## 2012-03-01 MED ORDER — VITAMIN D3 25 MCG (1000 UNIT) PO TABS
1000.0000 [IU] | ORAL_TABLET | Freq: Every day | ORAL | Status: DC
  Administered 2012-03-01 – 2012-03-02 (×2): 1000 [IU] via ORAL
  Filled 2012-03-01 (×3): qty 1

## 2012-03-01 MED ORDER — MIDAZOLAM HCL 5 MG/5ML IJ SOLN
INTRAMUSCULAR | Status: DC | PRN
  Administered 2012-03-01: 2 mg via INTRAVENOUS

## 2012-03-01 MED ORDER — FOLIC ACID 0.5 MG HALF TAB
0.5000 mg | ORAL_TABLET | Freq: Every day | ORAL | Status: DC
  Administered 2012-03-01 – 2012-03-02 (×2): 0.5 mg via ORAL
  Filled 2012-03-01 (×3): qty 1

## 2012-03-01 MED ORDER — VITAMIN E 180 MG (400 UNIT) PO CAPS
800.0000 [IU] | ORAL_CAPSULE | Freq: Every day | ORAL | Status: DC
  Administered 2012-03-01 – 2012-03-02 (×2): 800 [IU] via ORAL
  Filled 2012-03-01 (×3): qty 2

## 2012-03-01 MED ORDER — NICOTINE 21 MG/24HR TD PT24
21.0000 mg | MEDICATED_PATCH | Freq: Every day | TRANSDERMAL | Status: DC
  Administered 2012-03-01 – 2012-03-02 (×2): 21 mg via TRANSDERMAL
  Filled 2012-03-01 (×3): qty 1

## 2012-03-01 MED ORDER — MORPHINE SULFATE 2 MG/ML IJ SOLN
2.0000 mg | INTRAMUSCULAR | Status: DC | PRN
  Filled 2012-03-01: qty 1

## 2012-03-01 MED ORDER — FOLIC ACID 400 MCG PO TABS: 400.0000 ug | ORAL_TABLET | Freq: Every day | ORAL | Status: DC

## 2012-03-01 MED ORDER — HEPARIN SODIUM (PORCINE) 5000 UNIT/ML IJ SOLN
5000.0000 [IU] | Freq: Three times a day (TID) | INTRAMUSCULAR | Status: DC
  Filled 2012-03-01 (×2): qty 1

## 2012-03-01 MED ORDER — HYDROMORPHONE HCL PF 1 MG/ML IJ SOLN
0.2500 mg | INTRAMUSCULAR | Status: DC | PRN
  Administered 2012-03-01: 0.5 mg via INTRAVENOUS

## 2012-03-01 MED ORDER — ONDANSETRON HCL 4 MG PO TABS: 4.0000 mg | ORAL_TABLET | Freq: Four times a day (QID) | ORAL | Status: DC | PRN

## 2012-03-01 MED ORDER — LIDOCAINE HCL (CARDIAC) 20 MG/ML IV SOLN
INTRAVENOUS | Status: DC | PRN
  Administered 2012-03-01: 100 mg via INTRAVENOUS

## 2012-03-01 MED ORDER — POTASSIUM CHLORIDE IN NACL 20-0.9 MEQ/L-% IV SOLN
INTRAVENOUS | Status: DC
  Administered 2012-03-01 – 2012-03-03 (×4): via INTRAVENOUS
  Filled 2012-03-01 (×7): qty 1000

## 2012-03-01 SURGICAL SUPPLY — 33 items
BLADE SURG 10 STRL SS (BLADE) ×2 IMPLANT
BLADE SURG ROTATE 9660 (MISCELLANEOUS) IMPLANT
CANISTER SUCTION 2500CC (MISCELLANEOUS) ×2 IMPLANT
CHLORAPREP W/TINT 26ML (MISCELLANEOUS) ×2 IMPLANT
CLOTH BEACON ORANGE TIMEOUT ST (SAFETY) ×2 IMPLANT
COVER SURGICAL LIGHT HANDLE (MISCELLANEOUS) ×2 IMPLANT
DRAPE LAPAROTOMY TRNSV 102X78 (DRAPE) ×2 IMPLANT
DRAPE UTILITY 15X26 W/TAPE STR (DRAPE) ×4 IMPLANT
DRSG PAD ABDOMINAL 8X10 ST (GAUZE/BANDAGES/DRESSINGS) ×2 IMPLANT
ELECT CAUTERY BLADE 6.4 (BLADE) ×2 IMPLANT
ELECT REM PT RETURN 9FT ADLT (ELECTROSURGICAL) ×2
ELECTRODE REM PT RTRN 9FT ADLT (ELECTROSURGICAL) ×1 IMPLANT
GAUZE PACKING IODOFORM 1/2 (PACKING) ×2 IMPLANT
GLOVE EUDERMIC 7 POWDERFREE (GLOVE) ×2 IMPLANT
GOWN PREVENTION PLUS XLARGE (GOWN DISPOSABLE) ×2 IMPLANT
GOWN STRL NON-REIN LRG LVL3 (GOWN DISPOSABLE) ×2 IMPLANT
KIT BASIN OR (CUSTOM PROCEDURE TRAY) ×2 IMPLANT
KIT ROOM TURNOVER OR (KITS) ×2 IMPLANT
NS IRRIG 1000ML POUR BTL (IV SOLUTION) ×2 IMPLANT
PACK SURGICAL SETUP 50X90 (CUSTOM PROCEDURE TRAY) ×2 IMPLANT
PAD ARMBOARD 7.5X6 YLW CONV (MISCELLANEOUS) ×4 IMPLANT
PENCIL BUTTON HOLSTER BLD 10FT (ELECTRODE) ×2 IMPLANT
SPONGE GAUZE 4X4 12PLY (GAUZE/BANDAGES/DRESSINGS) ×4 IMPLANT
SPONGE LAP 18X18 X RAY DECT (DISPOSABLE) ×2 IMPLANT
SWAB COLLECTION DEVICE MRSA (MISCELLANEOUS) ×2 IMPLANT
SYR BULB IRRIGATION 50ML (SYRINGE) IMPLANT
TAPE CLOTH SURG 4X10 WHT LF (GAUZE/BANDAGES/DRESSINGS) ×2 IMPLANT
TOWEL OR 17X24 6PK STRL BLUE (TOWEL DISPOSABLE) ×2 IMPLANT
TOWEL OR 17X26 10 PK STRL BLUE (TOWEL DISPOSABLE) ×2 IMPLANT
TUBE ANAEROBIC SPECIMEN COL (MISCELLANEOUS) ×2 IMPLANT
TUBE CONNECTING 12X1/4 (SUCTIONS) ×2 IMPLANT
WATER STERILE IRR 1000ML POUR (IV SOLUTION) ×2 IMPLANT
YANKAUER SUCT BULB TIP NO VENT (SUCTIONS) ×2 IMPLANT

## 2012-03-01 NOTE — Anesthesia Postprocedure Evaluation (Signed)
Anesthesia Post Note  Patient: Gary [name]  Procedure(s) Performed: Procedure(s) (LRB): IRRIGATION AND DEBRIDEMENT ABSCESS (Right)  Anesthesia type: general  Patient location: PACU  Post pain: Pain level controlled  Post assessment: Patient's Cardiovascular Status Stable  Last Vitals:  Filed Vitals:   03/01/12 1830  BP: 149/65  Pulse: 83  Temp:   Resp: 19    Post vital signs: Reviewed and stable  Level of consciousness: sedated  Complications: No apparent anesthesia complications

## 2012-03-01 NOTE — Preoperative (Addendum)
Beta Blockers   Reason not to administer Beta Blockers:Not Applicable 

## 2012-03-01 NOTE — ED Notes (Signed)
CHEST XRAY COMPLETED. REPORT CALLED.

## 2012-03-01 NOTE — ED Notes (Signed)
SURGERY HERE TO SEE PT

## 2012-03-01 NOTE — Progress Notes (Signed)
Patient admitted to 5122. Dx: s/p I&D right breast abscess. Dressing clean, dry, and intact. IVF infusing. Denies any pain. Oriented to room. Call bell in reach.

## 2012-03-01 NOTE — ED Notes (Signed)
Sent from ucc for abscess to right nipple

## 2012-03-01 NOTE — ED Notes (Signed)
PT HAS AMBULATED TO CDU.

## 2012-03-01 NOTE — ED Provider Notes (Cosign Needed)
History     CSN: [id number]  Arrival date & time 03/01/12  1301   First MD Initiated Contact with Patient 03/01/12 1408      Chief Complaint  Patient presents with  . Abscess    (Consider location/radiation/quality/duration/timing/severity/associated sxs/prior treatment) HPI Comments: Gary [name] is a 66 y.o. Male who presents for several days of redness, swelling, and pain in his right breast. He has had similar problems 6 years ago, when he had an abscess, treated with incision and drainage and packing. He did not require ongoing management after that. He denies fever, chills, nausea, vomiting, weakness, dizziness, or back pain. He has not tried anything for the problem. He attempted to see his surgeon today, but was referred here for management.  Patient is a 66 y.o. male presenting with abscess. The history is provided by the patient.  Abscess     Past Medical History  Diagnosis Date  . Pneumonia     hx  . Cancer     prostate  . Hypertension   . GERD (gastroesophageal reflux disease)     Past Surgical History  Procedure Date  . Prostate surgery   . Hydrocele excision / repair 10  . Eye surgery 08    bil  . Inguinal hernia repair 12/08/2011    Procedure: HERNIA REPAIR INGUINAL ADULT;  Surgeon: Liz Malady, MD;  Location: [facility] OR;  Service: General;  Laterality: Right;  repair right inguinal hernia with mesh    Family History  Problem Relation Age of Onset  . Heart disease Father     History  Substance Use Topics  . Smoking status: Current Everyday Smoker -- 1.0 packs/day  . Smokeless tobacco: Not on file   Comment: 5-6 beers a day  . Alcohol Use: 21.0 oz/week    35 Cans of beer per week      Review of Systems  All other systems reviewed and are negative.    Allergies  Review of patient's allergies indicates no known allergies.  Home Medications   Current Outpatient Rx  Name Route Sig Dispense Refill  . ASPIRIN EC 81 MG PO TBEC Oral Take 162  mg by mouth daily.    Marland Kitchen VITAMIN D 1000 UNITS PO TABS Oral Take 1,000 Units by mouth daily.    Marland Kitchen FOLIC ACID 400 MCG PO TABS Oral Take 400 mcg by mouth daily.    Marland Kitchen LISINOPRIL 10 MG PO TABS Oral Take 10 mg by mouth daily.    Marland Kitchen OMEPRAZOLE 20 MG PO CPDR Oral Take 20 mg by mouth daily.    Marland Kitchen VITAMIN E 400 UNITS PO CAPS Oral Take 800 Units by mouth daily.      BP 131/75  Pulse 106  Temp(Src) 98.1 F (36.7 C) (Oral)  Resp 16  SpO2 98%  Physical Exam  Nursing note and vitals reviewed. Constitutional: He is oriented to person, place, and time. He appears well-developed and well-nourished.  HENT:  Head: Normocephalic and atraumatic.  Right Ear: External ear normal.  Left Ear: External ear normal.  Eyes: Conjunctivae and EOM are normal. Pupils are equal, round, and reactive to light.  Neck: Normal range of motion and phonation normal. Neck supple.  Cardiovascular: Normal rate, regular rhythm, normal heart sounds and intact distal pulses.   Pulmonary/Chest: Effort normal and breath sounds normal. He exhibits no bony tenderness.       Right superior breast is tender, swollen, with fluctuance, and surrounding redness; consistent with local abscess, and mild  cellulitis. Area involved is about 6 cm in diameter. There is no associated nipple discharge, swelling, or deformity.  Abdominal: Soft. Normal appearance. There is no tenderness.  Musculoskeletal: Normal range of motion.  Neurological: He is alert and oriented to person, place, and time. He has normal strength. No cranial nerve deficit or sensory deficit. He exhibits normal muscle tone. Coordination normal.  Skin: Skin is warm, dry and intact.  Psychiatric: He has a normal mood and affect. His behavior is normal. Judgment and thought content normal.    ED Course  Procedures (including critical care time) INCISION AND DRAINAGE Performed by: Flint Melter Consent: Verbal consent obtained. Risks and benefits: risks, benefits and alternatives  were discussed Type: abscess  Body area: right breast  Anesthesia: local infiltration  Local anesthetic: lidocaine 2% no epinephrine  Anesthetic total: 2 ml  Complexity: He had severe pain on anesthetic instillation and attempt at I and D. A needle aspiration with 18 gauge needle was done and I was able to get 1 cc of purulent material out.    Drainage: purulent      Patient tolerance: Severe pain with injections, aspiration and limited attempt at I&D. I was unable to incise thru the skin.   Date: 06/01/2012  Rate: 90  Rhythm: normal sinus rhythm  QRS Axis: normal  Intervals: normal  ST/T Wave abnormalities: normal  Conduction Disutrbances:none      Labs Reviewed  CULTURE, ROUTINE-ABSCESS   No results found.   1. Abscess of breast       MDM  Local abscess, right breast. Disease process is complicated by severe pain, and worrisome location of infection. General Surgery , is consulted for help with the management     Plan: Admit- Gen. Surg. To admit and treat.    Flint Melter, MD 03/01/12 1557  Flint Melter, MD 03/01/12 1638  Flint Melter, MD 06/01/12 1610  Flint Melter, MD 06/03/12 [phone]

## 2012-03-01 NOTE — Anesthesia Procedure Notes (Signed)
Procedure Name: LMA Insertion Date/Time: 03/01/2012 5:30 PM Performed by: Pami Wool S Pre-anesthesia Checklist: Patient identified, Emergency Drugs available, Suction available, Patient being monitored and Timeout performed Patient Re-evaluated:Patient Re-evaluated prior to inductionOxygen Delivery Method: Circle system utilized Preoxygenation: Pre-oxygenation with 100% oxygen Intubation Type: IV induction Ventilation: Mask ventilation without difficulty LMA: LMA inserted LMA Size: 4.0 Number of attempts: 1 Placement Confirmation: positive ETCO2 and breath sounds checked- equal and bilateral Tube secured with: Tape Dental Injury: Teeth and Oropharynx as per pre-operative assessment

## 2012-03-01 NOTE — Op Note (Signed)
Patient Name:           Gary [name]   Date of Surgery:        03/01/2012  Pre op Diagnosis:      Recurrent right breast abscess  Post op Diagnosis:    Complex right breast abscess and pilar cyst of breast  Procedure:                 Incision, drainage, and debridement of right breast abscess  Surgeon:                     Angelia Mould. Derrell Lolling, M.D., FACS  Assistant:                      none  Operative Indications:  This is a 66 year old Caucasian man with a history of tobacco abuse, hypertension, GERD, and prostate cancer. He has had a right breast abscess in the past. He presented to the emergency room this morning. The emergency department staff attempted to drain the abscess and were unsuccessful. The patient stated this was very painful and declined further attempts under local anesthesia or with conscious sedation. He requested drainage under general anesthesia. He was examined and counseled in the emergency department is brought to the operating room for management of his right breast infection.   Operative Findings:       The patient's right breast was markedly swollen, perhaps 6 or 8 cm in size. It was erythematous and a little bit fluctuant. There was no skin necrosis. The left breast looked normal. There is no axillary adenopathy. Within the right breast abscess was a giant pilar cyst which had abscessed.  Procedure in Detail:          Following the induction of general endotracheal anesthesia the patient's right chest and breast were prepped and draped in a sterile fashion. Surgical time out was performed. Intravenous antibiotics were given. A curvilinear incision was made from about the 7:30 position around to the 4:30 position at the inferior areolar margin. Dissection was carried down into the subareolar area I entered the abscess cavity containing purulent, foul-smelling cheesy material. This was cultured and evacuated. We irrigated out the wound and I saw that there was fairly  extensive large amount of cheesy   which I sharply debrided with scissors. I did this very carefully to avoid injuring the skin was I had all of the cyst and cyst wall out I irrigated the wound. There was almost no bleeding. I packed the wound with iodoform gauze and a dry bandage. He tolerated the procedure well. There were no complications. Counts were correct.     Angelia Mould. Derrell Lolling, M.D., FACS General and Minimally Invasive Surgery Breast and Colorectal Surgery  03/01/2012 5:48 PM

## 2012-03-01 NOTE — H&P (Signed)
I have personally interviewed and examined this patient.  He has a very large 6 cm right breast abscess, abscess, possibly larger. This is recurrent. It was very painful when the emergency department staff tried to drain this. This was unsuccessful. We have discussed options for sedation or anesthesia and he wishes to have this done under general anesthesia.  I've discussed indication and details of incision and drainage of his right breast abscess with him. Numerous risks have been outlined. He understands these issues. His questions were answered. He agrees with this plan.  He is aware that either I or Dr. Donell Beers will do this tonight.   Angelia Mould. Derrell Lolling, M.D., [facility] General and Minimally invasive Surgery Breast and Colorectal Surgery Office:   [phone] Pager:   [phone]

## 2012-03-01 NOTE — Anesthesia Preprocedure Evaluation (Addendum)
Anesthesia Evaluation  Patient identified by MRN, date of birth, ID band Patient awake    Reviewed: Allergy & Precautions, H&P , NPO status , Patient's Chart, lab work & pertinent test results  Airway Mallampati: I  Neck ROM: Full    Dental  (+) Teeth Intact and Dental Advisory Given   Pulmonary pneumonia , Current Smoker,  breath sounds clear to auscultation        Cardiovascular hypertension, Pt. on medications Rhythm:Regular Rate:Normal     Neuro/Psych negative neurological ROS     GI/Hepatic Neg liver ROS, GERD-  Medicated and Controlled,  Endo/Other  negative endocrine ROS  Renal/GU negative Renal ROS     Musculoskeletal negative musculoskeletal ROS (+)   Abdominal   Peds  Hematology negative hematology ROS (+)   Anesthesia Other Findings   Reproductive/Obstetrics                         Anesthesia Physical Anesthesia Plan  ASA: III and Emergent  Anesthesia Plan: General   Post-op Pain Management:    Induction: Intravenous  Airway Management Planned: Oral ETT  Additional Equipment:   Intra-op Plan:   Post-operative Plan: Extubation in OR  Informed Consent: I have reviewed the patients History and Physical, chart, labs and discussed the procedure including the risks, benefits and alternatives for the proposed anesthesia with the patient or authorized representative who has indicated his/her understanding and acceptance.   Dental advisory given  Plan Discussed with:   Anesthesia Plan Comments:        Anesthesia Quick Evaluation

## 2012-03-01 NOTE — H&P (Signed)
Gary [name] 15-Mar-1946  [id number].   Requesting MD: Dr. Effie [name] Chief Complaint: Right breast abscess HPI: Patient is a 66 year old male who presents to the [facility] with complaints of right breast abscess.  The patient reports this is the second time he has had this problem.  This problem was treated by Dr. Guinevere [name] several years ago in his office by an I and D.  The current problem began 2 weeks ago while the patient was on a trip to the [place].  It has progressively gotten worse and became severely painful which prompted him to come to the ED. The ED MD attempted an I and D but the patients pain level was too high.  He did attempt aspiration and got approx 1 cc of purulent drainage.  The patient reports his pain is severe and is reluctant to have anyone attempt an I and D again.  He has had malaise over the last few days and has had diarrhea for 2-3 days.  He denies fevers, chills, n/v, abd pain, SOB or cough.  Review of Systems: All systems reviewed and are negative except as indicated in HPI.  Family History  Problem Relation Age of Onset  . Heart disease Father     Past Medical History  Diagnosis Date  . Pneumonia     hx  . Cancer     prostate  . Hypertension   . GERD (gastroesophageal reflux disease)     Past Surgical History  Procedure Date  . Prostate surgery   . Hydrocele excision / repair 10  . Eye surgery 08    bil  . Inguinal hernia repair 12/08/2011    Procedure: HERNIA REPAIR INGUINAL ADULT;  Surgeon: Gary Malady, MD;  Location: [facility] OR;  Service: General;  Laterality: Right;  repair right inguinal hernia with mesh    Social History:  reports that he has been smoking.  He does not have any smokeless tobacco history on file. He reports that he drinks about 21 ounces of alcohol per week. He reports that he does not use illicit drugs.  Allergies: No Known Allergies   (Not in a hospital admission)  Blood pressure 128/84, pulse 88, temperature 97.9 F (36.6 C),  temperature source Oral, resp. rate 16, SpO2 95.00%. Physical Exam: General:  WDWN in NAD.  Pleasant and cooperative.  HEENT:  NCAT, EOMI, no icterus.  NECK:  Supple, no obvious mass or thyroid enlargement.  CV:  RRR, no murmur, no JVD.  CHEST:  No scars.  BREASTS:  Asymmetrical with large abscess on the areola of the right nipple.  Large area of fluctuance.  Some surrounding erythema, no drainage, several scratched areas are present.  No nipple discharge or suspicious skin lesions.  RESPIRATORY:  Breath sounds equal and clear. Respirations non labored.  ABDOMEN:  Soft, non tender, non distended, no masses, no organomegaly, active bowel sounds, no scars, no hernias.  MUSCULOSKELETAL:  FROM, good muscle tone, no edema, no venous stasis changes  SKIN:  Abscess as above.  No jaundice or suspicious rashes.  NEUROLOGIC:  Alert and oriented, answers questions appropriately, moves all four extremities equally.   No results found for this or any previous visit (from the past 48 hour(s)). No results found.     Assessment/Plan 1.  Right Breast Abscess: At current the patients pain level is too significant to do a bedside incision and drainage.  Dr. Derrell [name] will evaluate the patient in the ED to decide if admission and surgical  incision and drainage will be necessary.  Patient will need to be placed on antibiotics and cultures taken for sensitivities.  Will follow.  Gary [name] 03/01/2012, 4:06 PM

## 2012-03-01 NOTE — Transfer of Care (Signed)
Immediate Anesthesia Transfer of Care Note  Patient: Gary [name]  Procedure(s) Performed: Procedure(s) (LRB): IRRIGATION AND DEBRIDEMENT ABSCESS (Right)  Patient Location: PACU  Anesthesia Type: General  Level of Consciousness: awake, alert , oriented and patient cooperative  Airway & Oxygen Therapy: Patient Spontanous Breathing and Patient connected to face mask oxygen  Post-op Assessment: Report given to PACU RN, Post -op Vital signs reviewed and stable and Patient moving all extremities X 4  Post vital signs: Reviewed and stable  Complications: No apparent anesthesia complications

## 2012-03-02 ENCOUNTER — Encounter (HOSPITAL_COMMUNITY): Payer: Self-pay | Admitting: General Surgery

## 2012-03-02 NOTE — Care Management Note (Signed)
  Page 1 of 1   03/02/2012     9:46:39 AM   CARE MANAGEMENT NOTE 03/02/2012  Patient:  SABIEN, UMLAND   Account Number:  [id number]  Date Initiated:  03/02/2012  Documentation initiated by:  Ronny Flurry  Subjective/Objective Assessment:   DX: Recurrent right breast abscess  Incision, drainage, and debridement of right breast abscess     Action/Plan:   IV antibiotics   Anticipated DC Date:  03/03/2012   Anticipated DC Plan:  HOME/SELF CARE         Choice offered to / List presented to:             Status of service:  In process, will continue to follow Medicare Important Message given?   (If response is "NO", the following Medicare IM given date fields will be blank) Date Medicare IM given:   Date Additional Medicare IM given:    Discharge Disposition:  HOME/SELF CARE  Per UR Regulation:  Reviewed for med. necessity/level of care/duration of stay  If discussed at Long Length of Stay Meetings, dates discussed:    Comments:

## 2012-03-02 NOTE — Progress Notes (Signed)
1 Day Post-Op  Subjective: Not much pain. Had a little bleeding. Dressing has been changed. Cultures show gram-positive cocci in pairs. He wants to go home tomorrow.  Objective: Vital signs in last 24 hours: Temp:  [97.2 F (36.2 C)-98.5 F (36.9 C)] 98.1 F (36.7 C) (06/04 1342) Pulse Rate:  [67-94] 94  (06/04 1342) Resp:  [10-20] 17  (06/04 1342) BP: (121-157)/(65-93) 121/73 mmHg (06/04 1342) SpO2:  [94 %-99 %] 94 % (06/04 1342) Weight:  [157 lb 12.8 oz (71.578 kg)] 157 lb 12.8 oz (71.578 kg) (06/03 1900) Last BM Date: 03/01/12  Intake/Output from previous day: 06/03 0701 - 06/04 0700 In: 1609.5 [P.O.:120; I.V.:1489.5] Out: 1355 [Urine:1350; Blood:5] Intake/Output this shift:    physical exam:  General: Alert. Mental status normal. No distress. Breast: Right breast bandaged. Minimal drainage. Saline is less. Minimally tender. Not actively bleeding.  Lab Results:  Results for orders placed during the hospital encounter of 03/01/12 (from the past 24 hour(s))  CULTURE, ROUTINE-ABSCESS     Status: Normal (Preliminary result)   Collection Time   03/01/12  3:02 PM      Component Value Range   Specimen Description ABSCESS     Special Requests RIGHT NIPPLE     Gram Stain       Value: MODERATE WBC PRESENT,BOTH PMN AND MONONUCLEAR     NO SQUAMOUS EPITHELIAL CELLS SEEN     FEW GRAM POSITIVE COCCI IN PAIRS   Culture NO GROWTH     Report Status PENDING    CBC     Status: Abnormal   Collection Time   03/01/12  4:25 PM      Component Value Range   WBC 9.4  4.0 - 10.5 (K/uL)   RBC 4.78  4.22 - 5.81 (MIL/uL)   Hemoglobin 16.8  13.0 - 17.0 (g/dL)   HCT 29.5  18.8 - 41.6 (%)   MCV 97.1  78.0 - 100.0 (fL)   MCH 35.1 (*) 26.0 - 34.0 (pg)   MCHC 36.2 (*) 30.0 - 36.0 (g/dL)   RDW 60.6  30.1 - 60.1 (%)   Platelets 133 (*) 150 - 400 (K/uL)  BASIC METABOLIC PANEL     Status: Abnormal   Collection Time   03/01/12  4:25 PM      Component Value Range   Sodium 134 (*) 135 - 145 (mEq/L)   Potassium 4.0  3.5 - 5.1 (mEq/L)   Chloride 96  96 - 112 (mEq/L)   CO2 25  19 - 32 (mEq/L)   Glucose, Bld 114 (*) 70 - 99 (mg/dL)   BUN 5 (*) 6 - 23 (mg/dL)   Creatinine, Ser 0.93  0.50 - 1.35 (mg/dL)   Calcium 9.8  8.4 - 23.5 (mg/dL)   GFR calc non Af Amer 88 (*) >90 (mL/min)   GFR calc Af Amer >90  >90 (mL/min)  POCT I-STAT, CHEM 8     Status: Abnormal   Collection Time   03/01/12  4:36 PM      Component Value Range   Sodium 137  135 - 145 (mEq/L)   Potassium 3.9  3.5 - 5.1 (mEq/L)   Chloride 99  96 - 112 (mEq/L)   BUN <3 (*) 6 - 23 (mg/dL)   Creatinine, Ser 5.73  0.50 - 1.35 (mg/dL)   Glucose, Bld 220 (*) 70 - 99 (mg/dL)   Calcium, Ion 2.54  2.70 - 1.32 (mmol/L)   TCO2 25  0 - 100 (mmol/L)   Hemoglobin 17.3 (*)  13.0 - 17.0 (g/dL)   HCT 86.5  78.4 - 69.6 (%)  ANAEROBIC CULTURE     Status: Normal (Preliminary result)   Collection Time   03/01/12  6:21 PM      Component Value Range   Specimen Description ABSCESS RIGHT BREAST     Special Requests NONE     Gram Stain       Value: ABUNDANT WBC PRESENT,BOTH PMN AND MONONUCLEAR     NO SQUAMOUS EPITHELIAL CELLS SEEN     FEW GRAM POSITIVE COCCI IN PAIRS   Culture       Value: NO ANAEROBES ISOLATED; CULTURE IN PROGRESS FOR 5 DAYS   Report Status PENDING    CULTURE, ROUTINE-ABSCESS     Status: Normal (Preliminary result)   Collection Time   03/01/12  6:21 PM      Component Value Range   Specimen Description ABSCESS RIGHT BREAST     Special Requests NONE     Gram Stain       Value: ABUNDANT WBC PRESENT,BOTH PMN AND MONONUCLEAR     NO SQUAMOUS EPITHELIAL CELLS SEEN     FEW GRAM POSITIVE COCCI IN PAIRS   Culture NO GROWTH     Report Status PENDING    CBC     Status: Abnormal   Collection Time   03/01/12  8:21 PM      Component Value Range   WBC 6.5  4.0 - 10.5 (K/uL)   RBC 4.58  4.22 - 5.81 (MIL/uL)   Hemoglobin 16.1  13.0 - 17.0 (g/dL)   HCT 29.5  28.4 - 13.2 (%)   MCV 97.4  78.0 - 100.0 (fL)   MCH 35.2 (*) 26.0 - 34.0 (pg)    MCHC 36.1 (*) 30.0 - 36.0 (g/dL)   RDW 44.0  10.2 - 72.5 (%)   Platelets 108 (*) 150 - 400 (K/uL)  CREATININE, SERUM     Status: Abnormal   Collection Time   03/01/12  8:21 PM      Component Value Range   Creatinine, Ser 0.83  0.50 - 1.35 (mg/dL)   GFR calc non Af Amer 90 (*) >90 (mL/min)   GFR calc Af Amer >90  >90 (mL/min)     Studies/Results: @RISRSLT24 @     . aspirin EC  162 mg Oral Daily  .  ceFAZolin (ANCEF) IV  2 g Intravenous Q8H  . cholecalciferol  1,000 Units Oral Daily  . folic acid  0.5 mg Oral Daily  . heparin  5,000 Units Subcutaneous Q8H  . lisinopril  10 mg Oral Daily  . nicotine  21 mg Transdermal Daily  . pantoprazole  40 mg Oral Q1200  . vitamin E  800 Units Oral Daily  . DISCONTD: aspirin EC  162 mg Oral Daily  . DISCONTD:  ceFAZolin (ANCEF) IV  2 g Intravenous STAT  . DISCONTD:  ceFAZolin (ANCEF) IV  2 g Intravenous Q8H  . DISCONTD: folic acid  400 mcg Oral Daily  . DISCONTD: heparin  5,000 Units Subcutaneous Q8H     Assessment/Plan: s/p Procedure(s): IRRIGATION AND DEBRIDEMENT ABSCESS  POD #1. Stable Continue twice a day wound care. Continue IV Ancef. Check cultures.  Consider discharge on oral doxycycline tomorrow.    Patient Active Hospital Problem List: No active hospital problems.   LOS: 1 day    Harshan Kearley M. Derrell Lolling, M.D., [facility] General and Minimally invasive Surgery Breast and Colorectal Surgery Office:   [phone] Pager:   [phone]  03/02/2012  . .prob

## 2012-03-03 ENCOUNTER — Telehealth (INDEPENDENT_AMBULATORY_CARE_PROVIDER_SITE_OTHER): Payer: Self-pay

## 2012-03-03 ENCOUNTER — Telehealth (INDEPENDENT_AMBULATORY_CARE_PROVIDER_SITE_OTHER): Payer: Self-pay | Admitting: General Surgery

## 2012-03-03 MED ORDER — DOXYCYCLINE HYCLATE 100 MG PO TABS
100.0000 mg | ORAL_TABLET | Freq: Two times a day (BID) | ORAL | Status: DC
  Filled 2012-03-03 (×2): qty 1

## 2012-03-03 MED ORDER — DOXYCYCLINE HYCLATE 100 MG PO TABS: 100.0000 mg | ORAL_TABLET | Freq: Two times a day (BID) | ORAL | Status: AC

## 2012-03-03 NOTE — Progress Notes (Signed)
Patient discharged to home, with instructions and verbalized understanding, wife was at bedside during change of dressing

## 2012-03-03 NOTE — Discharge Instructions (Addendum)
Patient is do daily dressing changes as instructed. He may have sponge baths, but no showers at this time. Follow up with Dr. Derrell Lolling in 2 weeks time.

## 2012-03-03 NOTE — Progress Notes (Signed)
I have examined the patient this morning. His right breast wound is open and there is no more purulence. The cellulitis is markedly improved.  He'll be discharged home on oral antibiotics and dressing changes. He will followup with me in 2 weeks.  Angelia Mould. Derrell Lolling, M.D., [facility] General and Minimally invasive Surgery Breast and Colorectal Surgery Office:   [phone] Pager:   [phone]

## 2012-03-03 NOTE — Telephone Encounter (Signed)
Wife calling to ask how to loosen gauze that is "dried onto his skin."  Suggested she use warm water to loosen the fibers.  Try to keep water on the edges and away from wound.  She understands and will comply.

## 2012-03-03 NOTE — Discharge Summary (Signed)
I agree with physician discharge summary as dictated.   Angelia Mould. Derrell Lolling, M.D., [facility] General and Minimally invasive Surgery Breast and Colorectal Surgery Office:   [phone] Pager:   [phone]

## 2012-03-03 NOTE — Telephone Encounter (Signed)
Pt home doing well. I reviewed wound care instructions with pts wife. PO appt made. She will call with any concerns.

## 2012-03-03 NOTE — Discharge Summary (Signed)
Physician Discharge Summary  Patient ID: Gary [name] MRN: [id number] DOB/AGE: Jan 04, 1946 66 y.o.  Admit date: 03/01/2012 Discharge date: 03/03/2012  Admission Diagnoses:  Recurrent right breast abcess  Discharge Diagnoses: Complex right breast abscess and pilar cyst of breast  Active Problems:  * No active hospital problems. *    Discharged Condition: stable, status post excisional removal of right pilar cyst of breast.  Hospital Course: Patient was admitted from the ED for I&D of a recurrent pilar cyst of the right breast. He underwent this procedure on 03/02/12 without complications. He has been treated with IV antibiotics and BID wound care, and has tolerated all treatments well. He is now stable for discharge home.  Consults: None  Significant Diagnostic Studies: microbiology: wound culture: positive for gram positive cocci in pairs.  Treatments: antibiotics: Ancef  Discharge Exam: Blood pressure 148/79, pulse 69, temperature 97.3 F (36.3 C), temperature source Oral, resp. rate 18, height 5\' 8"  (1.727 m), weight 157 lb 12.8 oz (71.578 kg), SpO2 99.00%. General appearance: alert, cooperative and no distress  Disposition: 01-Home or Self Care   Medication List  As of 03/03/2012  9:33 AM   ASK your doctor about these medications         aspirin EC 81 MG tablet   Take 162 mg by mouth daily.      cholecalciferol 1000 UNITS tablet   Commonly known as: VITAMIN D   Take 1,000 Units by mouth daily.      folic acid 400 MCG tablet   Commonly known as: FOLVITE   Take 400 mcg by mouth daily.      lisinopril 10 MG tablet   Commonly known as: PRINIVIL,ZESTRIL   Take 10 mg by mouth daily.      omeprazole 20 MG capsule   Commonly known as: PRILOSEC   Take 20 mg by mouth daily.      vitamin E 400 UNIT capsule   Take 800 Units by mouth daily.             SignedBlenda Mounts 03/03/2012, 9:33 AM

## 2012-03-03 NOTE — Progress Notes (Signed)
2 Days Post-Op  Subjective: Doing well, anxious to be discharged home, tolerated earlier dressing change well.  Objective: Vital signs in last 24 hours: Temp:  [97.3 F (36.3 C)-98.4 F (36.9 C)] 97.3 F (36.3 C) (06/05 0543) Pulse Rate:  [64-94] 69  (06/05 0543) Resp:  [15-19] 18  (06/05 0543) BP: (121-148)/(73-82) 148/79 mmHg (06/05 0543) SpO2:  [94 %-99 %] 99 % (06/05 0543) Last BM Date: 03/01/12  Intake/Output from previous day: 06/04 0701 - 06/05 0700 In: 1694.9 [I.V.:1444.9; IV Piggyback:250] Out: 925 [Urine:925] Intake/Output this shift:    General appearance: alert, cooperative and no distress.  Lab Results:   Basename 03/01/12 2021 03/01/12 1636 03/01/12 1625  WBC 6.5 -- 9.4  HGB 16.1 17.3* --  HCT 44.6 51.0 --  PLT 108* -- 133*   BMET  Basename 03/01/12 2021 03/01/12 1636 03/01/12 1625  NA -- 137 134*  K -- 3.9 4.0  CL -- 99 96  CO2 -- -- 25  GLUCOSE -- 119* 114*  BUN -- <3* 5*  CREATININE 0.83 0.90 --  CALCIUM -- -- 9.8   PT/INR No results found for this basename: LABPROT:2,INR:2 in the last 72 hours ABG No results found for this basename: PHART:2,PCO2:2,PO2:2,HCO3:2 in the last 72 hours  Studies/Results: Dg Chest Port 1 View  03/01/2012  *RADIOLOGY REPORT*  Clinical Data: Breast abscess, preop  PORTABLE CHEST - 1 VIEW  Comparison: 12/08/2011  Findings: Lungs clear.  Heart size and pulmonary vascularity normal.  No effusion.  Visualized bones unremarkable.  IMPRESSION: No acute disease  Original Report Authenticated By: Osa Craver, M.D.    Anti-infectives: Anti-infectives     Start     Dose/Rate Route Frequency Ordered Stop   03/01/12 2200   ceFAZolin (ANCEF) IVPB 2 g/50 mL premix        2 g 100 mL/hr over 30 Minutes Intravenous 3 times per day 03/01/12 1905     03/01/12 1700   ceFAZolin (ANCEF) IVPB 2 g/50 mL premix  Status:  Discontinued        2 g 100 mL/hr over 30 Minutes Intravenous STAT 03/01/12 1625 03/01/12 1636   03/01/12  1645   ceFAZolin (ANCEF) IVPB 2 g/50 mL premix  Status:  Discontinued        2 g 100 mL/hr over 30 Minutes Intravenous 3 times per day 03/01/12 1634 03/01/12 1905          Assessment/Plan: s/p Procedure(s) (LRB): IRRIGATION AND DEBRIDEMENT ABSCESS (Right) 1. POD#2  Stable, dsg was changed today.  Plan: 1. Discharge to home with follow-up with Dr. Derrell Lolling in 2 weeks time. 2. With daily dressing changes. 3. Discharge home on oral antibiotics (Doxycycline x 14 days).  LOS: 2 days    Gary [name] 03/03/2012

## 2012-03-05 LAB — CULTURE, ROUTINE-ABSCESS

## 2012-03-10 ENCOUNTER — Telehealth (INDEPENDENT_AMBULATORY_CARE_PROVIDER_SITE_OTHER): Payer: Self-pay | Admitting: General Surgery

## 2012-03-10 NOTE — Telephone Encounter (Signed)
Pt calling to ask how long does he need to continue packing his wound.  Dressing changed QD by wife.  The wound was originally about 4 inches to pack and is now only about 1 inch.  Advised to continue packing until it can no longer accept packing.  He understands and will comply.

## 2012-03-25 ENCOUNTER — Telehealth (INDEPENDENT_AMBULATORY_CARE_PROVIDER_SITE_OTHER): Payer: Self-pay | Admitting: General Surgery

## 2012-03-25 NOTE — Telephone Encounter (Signed)
Called patient regarding message left. Patient complained that he was not about to sit in a doctors office that late in the day when he has things to do. Patient complained further that he did not like how far out his appointment on 03/29/12 was made. I advised the patient that the appointment was made based on what was available at the time. And that based on the information received previously may be another the reason the appointment was set up original for 03/29/12. I advised the patient of the conversations held prior today where both he and his wife indicated the wound was healing and not complication at that time. I confirmed with the patient no more packing is being applied to the site and he may sometimes have small drops of blood that come from the site. The patient wanted to continue to raise his voice and complain when the answers were already given to him. I advised the patient that I was trying to help him and that his being rude was unnecessary. The patient claimed he was not being rude, and I advised him that presentation is everything and his tone of voice was the reason for the statement made, considering I was trying to help him.  The patient wanted to argue the moment he answered the phone based on his tone of voice.

## 2012-03-26 ENCOUNTER — Telehealth (INDEPENDENT_AMBULATORY_CARE_PROVIDER_SITE_OTHER): Payer: Self-pay | Admitting: General Surgery

## 2012-03-26 NOTE — Telephone Encounter (Signed)
Called patient after obtaining approval from Dr. Derrell Lolling, patient prefers an 8:00 am appointment. Change has been made for same day 04/08/12 at 8:00.

## 2012-03-29 ENCOUNTER — Encounter (INDEPENDENT_AMBULATORY_CARE_PROVIDER_SITE_OTHER): Payer: Medicare Other | Admitting: General Surgery

## 2012-04-08 ENCOUNTER — Encounter (INDEPENDENT_AMBULATORY_CARE_PROVIDER_SITE_OTHER): Payer: Medicare Other | Admitting: General Surgery

## 2012-04-08 ENCOUNTER — Ambulatory Visit (INDEPENDENT_AMBULATORY_CARE_PROVIDER_SITE_OTHER): Payer: Medicare Other | Admitting: General Surgery

## 2012-04-08 ENCOUNTER — Encounter (INDEPENDENT_AMBULATORY_CARE_PROVIDER_SITE_OTHER): Payer: Self-pay | Admitting: General Surgery

## 2012-04-08 VITALS — BP 132/78 | HR 68 | Temp 97.4°F | Resp 14 | Ht 68.0 in | Wt 158.2 lb

## 2012-04-08 DIAGNOSIS — N611 Abscess of the breast and nipple: Secondary | ICD-10-CM | POA: Insufficient documentation

## 2012-04-08 DIAGNOSIS — N61 Mastitis without abscess: Secondary | ICD-10-CM

## 2012-04-08 NOTE — Progress Notes (Signed)
Patient ID: Gary [name], male   DOB: 1946-05-06, 66 y.o.   MRN: [id number]  Chief Complaint  Patient presents with  . Routine Post Op    I&D breast 03/01/12    HPI Gary [name] is a 66 y.o. male.  He returns for a postop check regarding his right breast abscess.  This patient has a history of tobacco abuse, hypertension, gastroesophageal reflux disease and prostate cancer. He presented with a large abscess in his right breast, greater than 6 cm old March 01, 2012. He went to the emergency room. He was evaluated. He was taken to the  operating room and underwent incision drainage and debridement of an abscessed sebaceous cyst of the breast. This was a very large area. Cultures showed gram-positive cocci but no growth.   He did well and was discharged home on doxycycline.  He states that the drainage has resolved, the skin has healed, and the pain has resolved. HPI  Past Medical History  Diagnosis Date  . Pneumonia     hx  . Cancer     prostate  . Hypertension   . GERD (gastroesophageal reflux disease)     Past Surgical History  Procedure Date  . Prostate surgery   . Hydrocele excision / repair 10  . Eye surgery 08    bil  . Inguinal hernia repair 12/08/2011    Procedure: HERNIA REPAIR INGUINAL ADULT;  Surgeon: Liz Malady, MD;  Location: [facility] OR;  Service: General;  Laterality: Right;  repair right inguinal hernia with mesh  . Irrigation and debridement abscess 03/01/2012    Procedure: IRRIGATION AND DEBRIDEMENT ABSCESS;  Surgeon: Ernestene Mention, MD;  Location: [facility] OR;  Service: General;  Laterality: Right;  Incision and drainage with debridement right breast abscess    Family History  Problem Relation Age of Onset  . Heart disease Father     Social History History  Substance Use Topics  . Smoking status: Current Everyday Smoker -- 1.0 packs/day  . Smokeless tobacco: Never Used   Comment: 5-6 beers a day  . Alcohol Use: 21.0 oz/week    35 Cans of beer per week     No Known Allergies  Current Outpatient Prescriptions  Medication Sig Dispense Refill  . aspirin EC 81 MG tablet Take 162 mg by mouth daily.      . cholecalciferol (VITAMIN D) 1000 UNITS tablet Take 1,000 Units by mouth daily.      . folic acid (FOLVITE) 400 MCG tablet Take 400 mcg by mouth daily.      Marland Kitchen lisinopril (PRINIVIL,ZESTRIL) 10 MG tablet Take 10 mg by mouth daily.      Marland Kitchen omeprazole (PRILOSEC) 20 MG capsule Take 20 mg by mouth daily.      . vitamin E 400 UNIT capsule Take 800 Units by mouth daily.        Review of Systems Review of Systems  Constitutional: Negative for fever, chills and unexpected weight change.  HENT: Negative for hearing loss, congestion, sore throat, trouble swallowing and voice change.   Eyes: Negative for visual disturbance.  Respiratory: Negative for cough and wheezing.   Cardiovascular: Negative for chest pain, palpitations and leg swelling.  Gastrointestinal: Negative for nausea, vomiting, abdominal pain, diarrhea, constipation, blood in stool, abdominal distention, anal bleeding and rectal pain.  Genitourinary: Negative for hematuria and difficulty urinating.  Musculoskeletal: Negative for arthralgias.  Skin: Negative for rash and wound.  Neurological: Negative for seizures, syncope, weakness and headaches.  Hematological:  Negative for adenopathy. Does not bruise/bleed easily.  Psychiatric/Behavioral: Negative for confusion.    Blood pressure 132/78, pulse 68, temperature 97.4 F (36.3 C), temperature source Temporal, resp. rate 14, height 5\' 8"  (1.727 m), weight 158 lb 4 oz (71.782 kg).  Physical Exam Physical Exam  Constitutional: He appears well-developed and well-nourished. No distress.  HENT:  Head: Normocephalic and atraumatic.  Cardiovascular: Normal rate, regular rhythm and normal heart sounds.   Pulmonary/Chest: Effort normal and breath sounds normal. No respiratory distress. He has no wheezes.       Right breast exam reveals that  the infection has resolved. The circumareolar incision has closed up and there is no drainage, cellulitis or residual fluid collection. Slightly tender.  Musculoskeletal: Normal range of motion. He exhibits no edema.  Skin: Skin is warm and dry. No rash noted. He is not diaphoretic. No erythema. No pallor.  Psychiatric: He has a normal mood and affect. His behavior is normal. Judgment and thought content normal.    Data Reviewed   Assessment    Recurrent abscess right breast, secondary to abscessed pilar cyst. Resolved following incision, drainage, debridement and antibiotics.  Tobacco abuse Hypertension GERD History prostate cancer.    Plan    He was advised to discontinue smoking.  Return to see me p.r.n.       Angelia Mould. Derrell Lolling, M.D., [facility] General and Minimally invasive Surgery Breast and Colorectal Surgery Office:   [phone] Pager:   [phone]  04/08/2012, 8:07 AM

## 2012-04-08 NOTE — Patient Instructions (Signed)
The infection in your right breast has completely healed. Nothing further needs to be done. Return to [facility] if further problems arise.

## 2012-05-17 LAB — ANAEROBIC CULTURE

## 2012-08-03 ENCOUNTER — Inpatient Hospital Stay (HOSPITAL_COMMUNITY): Payer: Medicare Other

## 2012-08-03 ENCOUNTER — Inpatient Hospital Stay (HOSPITAL_BASED_OUTPATIENT_CLINIC_OR_DEPARTMENT_OTHER)
Admission: EM | Admit: 2012-08-03 | Discharge: 2012-08-07 | DRG: 690 | Disposition: A | Discharge status: Home / Self Care | Payer: Medicare Other | Attending: Internal Medicine | Admitting: Internal Medicine

## 2012-08-03 ENCOUNTER — Emergency Department (HOSPITAL_BASED_OUTPATIENT_CLINIC_OR_DEPARTMENT_OTHER): Payer: Medicare Other

## 2012-08-03 ENCOUNTER — Encounter (HOSPITAL_BASED_OUTPATIENT_CLINIC_OR_DEPARTMENT_OTHER): Payer: Self-pay

## 2012-08-03 DIAGNOSIS — K4030 Unilateral inguinal hernia, with obstruction, without gangrene, not specified as recurrent: Secondary | ICD-10-CM | POA: Diagnosis present

## 2012-08-03 DIAGNOSIS — K219 Gastro-esophageal reflux disease without esophagitis: Secondary | ICD-10-CM | POA: Diagnosis present

## 2012-08-03 DIAGNOSIS — K4090 Unilateral inguinal hernia, without obstruction or gangrene, not specified as recurrent: Secondary | ICD-10-CM

## 2012-08-03 DIAGNOSIS — Z8546 Personal history of malignant neoplasm of prostate: Secondary | ICD-10-CM

## 2012-08-03 DIAGNOSIS — Z720 Tobacco use: Secondary | ICD-10-CM

## 2012-08-03 DIAGNOSIS — R410 Disorientation, unspecified: Secondary | ICD-10-CM

## 2012-08-03 DIAGNOSIS — N61 Mastitis without abscess: Secondary | ICD-10-CM

## 2012-08-03 DIAGNOSIS — I10 Essential (primary) hypertension: Secondary | ICD-10-CM

## 2012-08-03 DIAGNOSIS — Z9089 Acquired absence of other organs: Secondary | ICD-10-CM

## 2012-08-03 DIAGNOSIS — Z87891 Personal history of nicotine dependence: Secondary | ICD-10-CM

## 2012-08-03 DIAGNOSIS — N390 Urinary tract infection, site not specified: Principal | ICD-10-CM

## 2012-08-03 DIAGNOSIS — J069 Acute upper respiratory infection, unspecified: Secondary | ICD-10-CM | POA: Diagnosis present

## 2012-08-03 DIAGNOSIS — Z8701 Personal history of pneumonia (recurrent): Secondary | ICD-10-CM

## 2012-08-03 DIAGNOSIS — F1010 Alcohol abuse, uncomplicated: Secondary | ICD-10-CM

## 2012-08-03 DIAGNOSIS — R109 Unspecified abdominal pain: Secondary | ICD-10-CM

## 2012-08-03 DIAGNOSIS — F1020 Alcohol dependence, uncomplicated: Secondary | ICD-10-CM | POA: Diagnosis present

## 2012-08-03 DIAGNOSIS — K5900 Constipation, unspecified: Secondary | ICD-10-CM | POA: Diagnosis present

## 2012-08-03 DIAGNOSIS — R404 Transient alteration of awareness: Secondary | ICD-10-CM | POA: Diagnosis present

## 2012-08-03 DIAGNOSIS — Z79899 Other long term (current) drug therapy: Secondary | ICD-10-CM

## 2012-08-03 DIAGNOSIS — F10231 Alcohol dependence with withdrawal delirium: Secondary | ICD-10-CM | POA: Diagnosis present

## 2012-08-03 DIAGNOSIS — N611 Abscess of the breast and nipple: Secondary | ICD-10-CM | POA: Diagnosis present

## 2012-08-03 DIAGNOSIS — K5732 Diverticulitis of large intestine without perforation or abscess without bleeding: Secondary | ICD-10-CM | POA: Diagnosis present

## 2012-08-03 DIAGNOSIS — Z7982 Long term (current) use of aspirin: Secondary | ICD-10-CM

## 2012-08-03 DIAGNOSIS — F10931 Alcohol use, unspecified with withdrawal delirium: Secondary | ICD-10-CM | POA: Diagnosis present

## 2012-08-03 DIAGNOSIS — I498 Other specified cardiac arrhythmias: Secondary | ICD-10-CM | POA: Diagnosis present

## 2012-08-03 DIAGNOSIS — Z792 Long term (current) use of antibiotics: Secondary | ICD-10-CM

## 2012-08-03 DIAGNOSIS — F17200 Nicotine dependence, unspecified, uncomplicated: Secondary | ICD-10-CM | POA: Diagnosis present

## 2012-08-03 HISTORY — DX: Malignant neoplasm of prostate: C61

## 2012-08-03 HISTORY — DX: Disorientation, unspecified: R41.0

## 2012-08-03 HISTORY — DX: Other forms of dyspnea: R06.09

## 2012-08-03 HISTORY — DX: Dyspnea, unspecified: R06.00

## 2012-08-03 LAB — URINALYSIS, ROUTINE W REFLEX MICROSCOPIC
Glucose, UA: NEGATIVE mg/dL
Hgb urine dipstick: NEGATIVE
Protein, ur: 100 mg/dL — AB
Specific Gravity, Urine: 1.026 (ref 1.005–1.030)

## 2012-08-03 LAB — BLOOD GAS, ARTERIAL
Bicarbonate: 22.2 mEq/L (ref 20.0–24.0)
FIO2: 0.21 %
TCO2: 23.1 mmol/L (ref 0–100)
pCO2 arterial: 29.2 mmHg — ABNORMAL LOW (ref 35.0–45.0)
pH, Arterial: 7.494 — ABNORMAL HIGH (ref 7.350–7.450)
pO2, Arterial: 69.6 mmHg — ABNORMAL LOW (ref 80.0–100.0)

## 2012-08-03 LAB — PROTIME-INR
INR: 1.2 (ref 0.00–1.49)
Prothrombin Time: 15 seconds (ref 11.6–15.2)

## 2012-08-03 LAB — CBC WITH DIFFERENTIAL/PLATELET
Eosinophils Relative: 2 % (ref 0–5)
HCT: 46 % (ref 39.0–52.0)
Hemoglobin: 16.8 g/dL (ref 13.0–17.0)
Lymphocytes Relative: 11 % — ABNORMAL LOW (ref 12–46)
Lymphs Abs: 1 10*3/uL (ref 0.7–4.0)
MCV: 99.6 fL (ref 78.0–100.0)
Monocytes Relative: 13 % — ABNORMAL HIGH (ref 3–12)
Platelets: 152 10*3/uL (ref 150–400)
RBC: 4.62 MIL/uL (ref 4.22–5.81)
WBC: 9.4 10*3/uL (ref 4.0–10.5)

## 2012-08-03 LAB — BASIC METABOLIC PANEL
BUN: 23 mg/dL (ref 6–23)
CO2: 26 mEq/L (ref 19–32)
Calcium: 9.3 mg/dL (ref 8.4–10.5)
Glucose, Bld: 132 mg/dL — ABNORMAL HIGH (ref 70–99)
Sodium: 134 mEq/L — ABNORMAL LOW (ref 135–145)

## 2012-08-03 LAB — URINE MICROSCOPIC-ADD ON

## 2012-08-03 LAB — HEPATIC FUNCTION PANEL
ALT: 22 U/L (ref 0–53)
Albumin: 3.1 g/dL — ABNORMAL LOW (ref 3.5–5.2)
Alkaline Phosphatase: 96 U/L (ref 39–117)
Total Protein: 7.9 g/dL (ref 6.0–8.3)

## 2012-08-03 MED ORDER — PIPERACILLIN-TAZOBACTAM 3.375 G IVPB
3.3750 g | Freq: Three times a day (TID) | INTRAVENOUS | Status: DC
  Administered 2012-08-03 – 2012-08-07 (×12): 3.375 g via INTRAVENOUS
  Filled 2012-08-03 (×16): qty 50

## 2012-08-03 MED ORDER — PIPERACILLIN-TAZOBACTAM 3.375 G IVPB
3.3750 g | Freq: Three times a day (TID) | INTRAVENOUS | Status: DC
  Filled 2012-08-03 (×2): qty 50

## 2012-08-03 MED ORDER — LORAZEPAM 1 MG PO TABS
1.0000 mg | ORAL_TABLET | Freq: Four times a day (QID) | ORAL | Status: DC | PRN
  Administered 2012-08-04: 1 mg via ORAL
  Filled 2012-08-03: qty 1

## 2012-08-03 MED ORDER — SODIUM CHLORIDE 0.9 % IV BOLUS (SEPSIS)
1000.0000 mL | Freq: Once | INTRAVENOUS | Status: AC
  Administered 2012-08-03: 1000 mL via INTRAVENOUS

## 2012-08-03 MED ORDER — SODIUM CHLORIDE 0.9 % IV SOLN
INTRAVENOUS | Status: DC
  Administered 2012-08-03: 17:00:00 via INTRAVENOUS

## 2012-08-03 MED ORDER — PANTOPRAZOLE SODIUM 40 MG PO TBEC
40.0000 mg | DELAYED_RELEASE_TABLET | Freq: Every day | ORAL | Status: DC
  Administered 2012-08-03 – 2012-08-07 (×5): 40 mg via ORAL
  Filled 2012-08-03 (×5): qty 1

## 2012-08-03 MED ORDER — ALBUTEROL SULFATE (5 MG/ML) 0.5% IN NEBU: 2.5000 mg | INHALATION_SOLUTION | Freq: Four times a day (QID) | RESPIRATORY_TRACT | Status: DC | PRN

## 2012-08-03 MED ORDER — VITAMIN B-1 100 MG PO TABS
100.0000 mg | ORAL_TABLET | Freq: Every day | ORAL | Status: DC
  Administered 2012-08-03 – 2012-08-07 (×5): 100 mg via ORAL
  Filled 2012-08-03 (×5): qty 1

## 2012-08-03 MED ORDER — FENTANYL CITRATE 0.05 MG/ML IJ SOLN
INTRAMUSCULAR | Status: AC
Start: 2012-08-03 — End: 2012-08-03
  Administered 2012-08-03: 100 ug
  Filled 2012-08-03: qty 2

## 2012-08-03 MED ORDER — IOHEXOL 300 MG/ML  SOLN
100.0000 mL | Freq: Once | INTRAMUSCULAR | Status: AC | PRN
  Administered 2012-08-03: 100 mL via INTRAVENOUS

## 2012-08-03 MED ORDER — HYDROCODONE-ACETAMINOPHEN 5-325 MG PO TABS: 1.0000 | ORAL_TABLET | ORAL | Status: DC | PRN

## 2012-08-03 MED ORDER — NICOTINE 21 MG/24HR TD PT24
MEDICATED_PATCH | TRANSDERMAL | Status: AC
  Administered 2012-08-03: 21 mg
  Filled 2012-08-03: qty 1

## 2012-08-03 MED ORDER — HYDROMORPHONE HCL PF 1 MG/ML IJ SOLN: 1.0000 mg | INTRAMUSCULAR | Status: DC | PRN

## 2012-08-03 MED ORDER — THIAMINE HCL 100 MG/ML IJ SOLN
100.0000 mg | Freq: Every day | INTRAMUSCULAR | Status: DC
  Filled 2012-08-03 (×5): qty 1

## 2012-08-03 MED ORDER — ALBUTEROL SULFATE (5 MG/ML) 0.5% IN NEBU
5.0000 mg | INHALATION_SOLUTION | Freq: Once | RESPIRATORY_TRACT | Status: AC
  Administered 2012-08-03: 5 mg via RESPIRATORY_TRACT
  Filled 2012-08-03: qty 1

## 2012-08-03 MED ORDER — RANITIDINE HCL 150 MG/10ML PO SYRP
150.0000 mg | ORAL_SOLUTION | Freq: Once | ORAL | Status: AC
  Administered 2012-08-03: 150 mg via ORAL
  Filled 2012-08-03: qty 10

## 2012-08-03 MED ORDER — ONDANSETRON HCL 4 MG PO TABS: 4.0000 mg | ORAL_TABLET | Freq: Four times a day (QID) | ORAL | Status: DC | PRN

## 2012-08-03 MED ORDER — LORAZEPAM 2 MG/ML IJ SOLN: 1.0000 mg | Freq: Four times a day (QID) | INTRAMUSCULAR | Status: DC | PRN

## 2012-08-03 MED ORDER — ONDANSETRON HCL 4 MG/2ML IJ SOLN: 4.0000 mg | Freq: Four times a day (QID) | INTRAMUSCULAR | Status: DC | PRN

## 2012-08-03 MED ORDER — ASPIRIN EC 81 MG PO TBEC
162.0000 mg | DELAYED_RELEASE_TABLET | Freq: Every day | ORAL | Status: DC
  Administered 2012-08-03 – 2012-08-07 (×5): 162 mg via ORAL
  Filled 2012-08-03 (×5): qty 2

## 2012-08-03 MED ORDER — IPRATROPIUM BROMIDE 0.02 % IN SOLN
0.5000 mg | Freq: Once | RESPIRATORY_TRACT | Status: AC
  Administered 2012-08-03: 0.5 mg via RESPIRATORY_TRACT
  Filled 2012-08-03: qty 2.5

## 2012-08-03 MED ORDER — LEVOFLOXACIN IN D5W 500 MG/100ML IV SOLN
500.0000 mg | INTRAVENOUS | Status: DC
  Filled 2012-08-03: qty 100

## 2012-08-03 MED ORDER — ONDANSETRON HCL 4 MG/2ML IJ SOLN: 4.0000 mg | Freq: Three times a day (TID) | INTRAMUSCULAR | Status: DC | PRN

## 2012-08-03 MED ORDER — KCL IN DEXTROSE-NACL 10-5-0.45 MEQ/L-%-% IV SOLN
INTRAVENOUS | Status: AC
  Administered 2012-08-03 – 2012-08-04 (×2): via INTRAVENOUS
  Filled 2012-08-03 (×3): qty 1000

## 2012-08-03 MED ORDER — LEVOFLOXACIN IN D5W 500 MG/100ML IV SOLN
500.0000 mg | INTRAVENOUS | Status: DC
  Administered 2012-08-03: 500 mg via INTRAVENOUS
  Filled 2012-08-03 (×2): qty 100

## 2012-08-03 MED ORDER — GUAIFENESIN-DM 100-10 MG/5ML PO SYRP
5.0000 mL | ORAL_SOLUTION | ORAL | Status: DC | PRN
  Administered 2012-08-06: 5 mL via ORAL
  Filled 2012-08-03 (×2): qty 5

## 2012-08-03 MED ORDER — CHLORDIAZEPOXIDE HCL 5 MG PO CAPS
10.0000 mg | ORAL_CAPSULE | Freq: Three times a day (TID) | ORAL | Status: DC
  Administered 2012-08-03 – 2012-08-05 (×5): 10 mg via ORAL
  Filled 2012-08-03 (×5): qty 2

## 2012-08-03 MED ORDER — IOHEXOL 300 MG/ML  SOLN
20.0000 mL | INTRAMUSCULAR | Status: AC
  Administered 2012-08-03: 20 mL via ORAL

## 2012-08-03 MED ORDER — CHLORDIAZEPOXIDE HCL 10 MG PO CAPS: 10.0000 mg | ORAL_CAPSULE | Freq: Three times a day (TID) | ORAL | Status: DC

## 2012-08-03 MED ORDER — FENTANYL CITRATE 0.05 MG/ML IJ SOLN
100.0000 ug | Freq: Once | INTRAMUSCULAR | Status: AC
  Administered 2012-08-03: 100 ug via INTRAVENOUS
  Filled 2012-08-03: qty 2

## 2012-08-03 MED ORDER — SODIUM CHLORIDE 0.9 % IV SOLN
Freq: Once | INTRAVENOUS | Status: AC
  Administered 2012-08-03: 13:00:00 via INTRAVENOUS

## 2012-08-03 MED ORDER — ADULT MULTIVITAMIN W/MINERALS CH
1.0000 | ORAL_TABLET | Freq: Every day | ORAL | Status: DC
  Administered 2012-08-03 – 2012-08-07 (×5): 1 via ORAL
  Filled 2012-08-03 (×5): qty 1

## 2012-08-03 MED ORDER — DEXTROSE 5 % IV SOLN
1.0000 g | Freq: Once | INTRAVENOUS | Status: AC
  Administered 2012-08-03: 1 g via INTRAVENOUS
  Filled 2012-08-03: qty 10

## 2012-08-03 MED ORDER — METOPROLOL TARTRATE 25 MG PO TABS
25.0000 mg | ORAL_TABLET | Freq: Two times a day (BID) | ORAL | Status: DC
  Administered 2012-08-03 – 2012-08-07 (×8): 25 mg via ORAL
  Filled 2012-08-03 (×10): qty 1

## 2012-08-03 MED ORDER — FOLIC ACID 1 MG PO TABS
1.0000 mg | ORAL_TABLET | Freq: Every day | ORAL | Status: DC
  Administered 2012-08-03 – 2012-08-07 (×5): 1 mg via ORAL
  Filled 2012-08-03 (×5): qty 1

## 2012-08-03 MED ORDER — SODIUM CHLORIDE 0.9 % IJ SOLN
3.0000 mL | Freq: Two times a day (BID) | INTRAMUSCULAR | Status: DC
  Administered 2012-08-04 – 2012-08-07 (×3): 3 mL via INTRAVENOUS

## 2012-08-03 MED ORDER — ALBUTEROL SULFATE (5 MG/ML) 0.5% IN NEBU: 2.5000 mg | INHALATION_SOLUTION | RESPIRATORY_TRACT | Status: DC | PRN

## 2012-08-03 NOTE — ED Notes (Signed)
Patient transported to X-ray 

## 2012-08-03 NOTE — ED Notes (Signed)
Pt reports a 4 day history of right sided abdominal pain and SHOB.  Receiving tx for URI with Zithromax. He also reports hallucinations that started after an I&D on Friday.

## 2012-08-03 NOTE — Progress Notes (Signed)
Pt is very confused and has pulled out his IV.

## 2012-08-03 NOTE — Consult Note (Signed)
Reason for Consult:LIH Referring Physician: Dr Alvester Chou is an 66 y.o. male.  HPI: 66 yo WM referred by Dr Thedore Mins for evaluation of LIH. The patient went to [facility] for evaluation because of lower abdominal pain for the past several days. The patient denies any prior history of similar pain. He denies any nausea, vomiting, diarrhea. He states he hasn't had a bowel movement in about 6 days. Normally has a bowel movement on a daily basis. He reports lots of ongoing flatus. Last week he had a dental procedure for 4 teeth were extracted and was placed on narcotics for pain control. The wife also reports that he has had an episode of bronchitis. She states she was placed on Z-Pak last week. He was also taken to [facility] because of confusion. The wife reports he was disoriented earlier today. He reports a poor appetite for the past several months. He denies any weight loss. He denies any stool caliber changes. He denies any melena or hematochezia. He has had a prior right inguinal hernia repair earlier in the year. He generally drinks 5-6 alcoholic beverages per day. He has not had an alcoholic beverage since Sunday. He was found to have a urinary tract infection and was admitted to the hospital for possible urosepsis because of his delirium. On admission he was noted to have a tender left inguinal hernia and we were asked to consult.  Past Medical History  Diagnosis Date  . Hypertension   . GERD (gastroesophageal reflux disease)   . Pneumonia     "5-7 years ago" (08/03/2012)  . Exertional dyspnea     "just recently" (08/03/2012)  . Delirium, acute 08/03/2012  . Prostate cancer     Past Surgical History  Procedure Date  . Prostate surgery   . Hydrocele excision / repair 2010  . Inguinal hernia repair 12/08/2011    Procedure: HERNIA REPAIR INGUINAL ADULT;  Surgeon: Liz Malady, MD;  Location: [facility] OR;  Service: General;  Laterality: Right;  repair right inguinal  hernia with mesh  . Irrigation and debridement abscess 03/01/2012    Procedure: IRRIGATION AND DEBRIDEMENT ABSCESS;  Surgeon: Ernestene Mention, MD;  Location: [facility] OR;  Service: General;  Laterality: Right;  Incision and drainage with debridement right breast abscess  . Tonsillectomy   . Cataract extraction w/ intraocular lens  implant, bilateral 2008    Family History  Problem Relation Age of Onset  . Heart disease Father     Social History:  reports that he has been smoking Cigarettes.  He has a 52 pack-year smoking history. He has never used smokeless tobacco. He reports that he drinks about 16.8 ounces of alcohol per week. He reports that he does not use illicit drugs.  Allergies: No Known Allergies  Medications: I have reviewed the patient's current medications.  Results for orders placed during the hospital encounter of 08/03/12 (from the past 48 hour(s))  CBC WITH DIFFERENTIAL     Status: Abnormal   Collection Time   08/03/12 11:10 AM      Component Value Range Comment   WBC 9.4  4.0 - 10.5 K/uL    RBC 4.62  4.22 - 5.81 MIL/uL    Hemoglobin 16.8  13.0 - 17.0 g/dL    HCT 16.1  09.6 - 04.5 %    MCV 99.6  78.0 - 100.0 fL    MCH 36.4 (*) 26.0 - 34.0 pg    MCHC 36.5 (*)  30.0 - 36.0 g/dL    RDW 66.4  40.3 - 47.4 %    Platelets 152  150 - 400 K/uL    Neutrophils Relative 75  43 - 77 %    Neutro Abs 7.0  1.7 - 7.7 K/uL    Lymphocytes Relative 11 (*) 12 - 46 %    Lymphs Abs 1.0  0.7 - 4.0 K/uL    Monocytes Relative 13 (*) 3 - 12 %    Monocytes Absolute 1.2 (*) 0.1 - 1.0 K/uL    Eosinophils Relative 2  0 - 5 %    Eosinophils Absolute 0.2  0.0 - 0.7 K/uL    Basophils Relative 0  0 - 1 %    Basophils Absolute 0.0  0.0 - 0.1 K/uL   BASIC METABOLIC PANEL     Status: Abnormal   Collection Time   08/03/12 11:10 AM      Component Value Range Comment   Sodium 134 (*) 135 - 145 mEq/L    Potassium 3.6  3.5 - 5.1 mEq/L    Chloride 93 (*) 96 - 112 mEq/L    CO2 26  19 - 32 mEq/L     Glucose, Bld 132 (*) 70 - 99 mg/dL    BUN 23  6 - 23 mg/dL    Creatinine, Ser 2.59  0.50 - 1.35 mg/dL    Calcium 9.3  8.4 - 56.3 mg/dL    GFR calc non Af Amer 68 (*) >90 mL/min    GFR calc Af Amer 78 (*) >90 mL/min   URINALYSIS, ROUTINE W REFLEX MICROSCOPIC     Status: Abnormal   Collection Time   08/03/12 11:19 AM      Component Value Range Comment   Color, Urine ORANGE (*) YELLOW BIOCHEMICALS MAY BE AFFECTED BY COLOR   APPearance CLOUDY (*) CLEAR    Specific Gravity, Urine 1.026  1.005 - 1.030    pH 6.0  5.0 - 8.0    Glucose, UA NEGATIVE  NEGATIVE mg/dL    Hgb urine dipstick NEGATIVE  NEGATIVE    Bilirubin Urine MODERATE (*) NEGATIVE    Ketones, ur 15 (*) NEGATIVE mg/dL    Protein, ur 875 (*) NEGATIVE mg/dL    Urobilinogen, UA 1.0  0.0 - 1.0 mg/dL    Nitrite POSITIVE (*) NEGATIVE    Leukocytes, UA TRACE (*) NEGATIVE   URINE MICROSCOPIC-ADD ON     Status: Abnormal   Collection Time   08/03/12 11:19 AM      Component Value Range Comment   Squamous Epithelial / LPF FEW (*) RARE    WBC, UA 3-6  <3 WBC/hpf    RBC / HPF 0-2  <3 RBC/hpf    Bacteria, UA MANY (*) RARE    Casts HYALINE CASTS (*) NEGATIVE GRANULAR CAST  HEPATIC FUNCTION PANEL     Status: Abnormal   Collection Time   08/03/12 11:50 AM      Component Value Range Comment   Total Protein 7.9  6.0 - 8.3 g/dL    Albumin 3.1 (*) 3.5 - 5.2 g/dL    AST 50 (*) 0 - 37 U/L    ALT 22  0 - 53 U/L    Alkaline Phosphatase 96  39 - 117 U/L    Total Bilirubin 1.6 (*) 0.3 - 1.2 mg/dL    Bilirubin, Direct 0.9 (*) 0.0 - 0.3 mg/dL    Indirect Bilirubin 0.7  0.3 - 0.9 mg/dL   AMMONIA  Status: Normal   Collection Time   08/03/12 12:15 PM      Component Value Range Comment   Ammonia 25  11 - 60 umol/L   BLOOD GAS, ARTERIAL     Status: Abnormal   Collection Time   08/03/12  5:51 PM      Component Value Range Comment   FIO2 0.21      pH, Arterial 7.494 (*) 7.350 - 7.450    pCO2 arterial 29.2 (*) 35.0 - 45.0 mmHg    pO2, Arterial  69.6 (*) 80.0 - 100.0 mmHg    Bicarbonate 22.2  20.0 - 24.0 mEq/L    TCO2 23.1  0 - 100 mmol/L    Acid-base deficit 0.7  0.0 - 2.0 mmol/L    O2 Saturation 94.7      Patient temperature 98.6      Collection site RIGHT RADIAL      Drawn by [phone]      Sample type ARTERIAL DRAW      Allens test (pass/fail) PASS  PASS   PROTIME-INR     Status: Normal   Collection Time   08/03/12  6:11 PM      Component Value Range Comment   Prothrombin Time 15.0  11.6 - 15.2 seconds    INR 1.20  0.00 - 1.49     Dg Chest 2 View  08/03/2012  *RADIOLOGY REPORT*  Clinical Data: Chest pain  CHEST - 2 VIEW  Comparison: 07/16/2012  Findings: The heart and pulmonary vascularity are within normal limits.  The lungs are clear bilaterally.  No sizable effusion is seen.  No acute bony abnormality is noted.  IMPRESSION: No acute intrathoracic abnormality.   Original Report Authenticated By: Alcide Clever, M.D.     Review of Systems  Constitutional: Negative for fever, chills and weight loss.  HENT: Negative for nosebleeds.        Had several teeth extracted last week and was placed on pain meds for pain  Eyes: Negative for blurred vision.  Respiratory: Positive for cough.        Wife reports he was treated for bronchitis recently - was placed on a Zpac last week  Cardiovascular: Negative for chest pain, palpitations and leg swelling.       Sleeps on 2 pillows b/c breathing; no PND. Denies SOB. +DOE; denies cp  Gastrointestinal: Positive for abdominal pain and constipation. Negative for nausea, vomiting, diarrhea, blood in stool and melena.  Genitourinary: Negative for dysuria and urgency.  Neurological: Positive for weakness. Negative for seizures and headaches.  Psychiatric/Behavioral: Positive for substance abuse.       Drinks 5-6 drinks per day; last drink on Sunday; denies prior DT   Blood pressure 124/70, pulse 98, temperature 98.2 F (36.8 C), temperature source Oral, resp. rate 18, height 5\' 8"  (1.727 m),  weight 156 lb 12 oz (71.1 kg), SpO2 96.00%. Physical Exam  Vitals reviewed. Constitutional: He appears well-developed and well-nourished. No distress.  HENT:  Head: Normocephalic and atraumatic.  Right Ear: External ear normal.  Left Ear: External ear normal.  Eyes: Conjunctivae normal are normal. No scleral icterus.  Neck: Neck supple. No tracheal deviation present.  Cardiovascular: Normal rate, regular rhythm and normal heart sounds.   Respiratory: Effort normal. He has wheezes (some mild wheezes).       Poor deep inspiration; +cough - nonproductive  GI: Soft. He exhibits distension (mild). There is no rigidity, no rebound and no guarding. A hernia is present. Hernia confirmed positive  in the left inguinal area. Hernia confirmed negative in the ventral area and confirmed negative in the right inguinal area.         Lower abd TTP - mild; no RT, guarding, peritonitis; LIH essentially reducible; testes ok. No evid of RIH   Lymphadenopathy:    He has no cervical adenopathy.  Neurological: He is alert. He is disoriented. No sensory deficit. GCS eye subscore is 4. GCS verbal subscore is 4. GCS motor subscore is 6.       Not oriented to month; otherwise knows year, city, state; some trouble with recent recall/events  Skin: He is not diaphoretic.    Assessment/Plan: Alcohol abuse/dependence HTN UTI Left inguinal hernia - incarcerated  He does have a partially incarcerated left inguinal hernia. I was able to almost completely reduce it. Fortunately he is not currently tachycardic, acidotic, or has an elevated white blood cell count. Clinically he does not have peritonitis. I do not believe he has a strangulated inguinal hernia. I do agree with scanning his abdomen and pelvis. He has been on recent narcotics which could also give him an ileus and constipation. He does not have a complete obstruction since he has been having ongoing flatus. I doubt inguinal hernia is the source of his confusion  and delirium. I agree with CIWA protocol. If we could avoid operating on his hernia during this admission that would be the ideal scenario given his potential alcohol withdrawal and the urinary tract infection which significantly increases his risk for perioperative complications. However we need to await the CT scan results before making a definitive plan. For now I would keep the patient n.p.o. Except pills  Mary Sella. Andrey Campanile, MD, FACS General, Bariatric, & Minimally Invasive Surgery [facility] [place]   [facility] M 08/03/2012, 7:45 PM

## 2012-08-03 NOTE — ED Provider Notes (Signed)
History     CSN: [id number]  Arrival date & time 08/03/12  1046   First MD Initiated Contact with Patient 08/03/12 1057      Chief Complaint  Patient presents with  . Shortness of Breath  . Cough  . Abdominal Pain     HPI Pt reports a 4 day history of right sided abdominal pain and SHOB. Receiving tx for URI with Zithromax. He also reports hallucinations that started after an I&D on Friday  Past Medical History  Diagnosis Date  . Pneumonia     hx  . Cancer     prostate  . Hypertension   . GERD (gastroesophageal reflux disease)     Past Surgical History  Procedure Date  . Prostate surgery   . Hydrocele excision / repair 10  . Eye surgery 08    bil  . Inguinal hernia repair 12/08/2011    Procedure: HERNIA REPAIR INGUINAL ADULT;  Surgeon: Liz Malady, MD;  Location: [facility] OR;  Service: General;  Laterality: Right;  repair right inguinal hernia with mesh  . Irrigation and debridement abscess 03/01/2012    Procedure: IRRIGATION AND DEBRIDEMENT ABSCESS;  Surgeon: Ernestene Mention, MD;  Location: [facility] OR;  Service: General;  Laterality: Right;  Incision and drainage with debridement right breast abscess    Family History  Problem Relation Age of Onset  . Heart disease Father     History  Substance Use Topics  . Smoking status: Current Every Day Smoker -- 1.0 packs/day  . Smokeless tobacco: Never Used     Comment: 5-6 beers a day  . Alcohol Use: 3.5 oz/week    7 drink(s) per week     Comment: states he drinks 2 drinks of liquor daily- last drink 08/02/12      Review of Systems All other systems reviewed and are negative Allergies  Review of patient's allergies indicates no known allergies.  Home Medications   Current Outpatient Rx  Name  Route  Sig  Dispense  Refill  . ZITHROMAX PO   Oral   Take by mouth.         . ASPIRIN EC 81 MG PO TBEC   Oral   Take 162 mg by mouth daily.         Marland Kitchen VITAMIN D 1000 UNITS PO TABS   Oral   Take 1,000 Units by mouth  daily.         Marland Kitchen FOLIC ACID 400 MCG PO TABS   Oral   Take 400 mcg by mouth daily.         Marland Kitchen LISINOPRIL 10 MG PO TABS   Oral   Take 10 mg by mouth daily.         Marland Kitchen OMEPRAZOLE 20 MG PO CPDR   Oral   Take 20 mg by mouth daily.         Marland Kitchen VITAMIN E 400 UNITS PO CAPS   Oral   Take 800 Units by mouth daily.           BP 148/94  Pulse 121  Temp 98 F (36.7 C) (Oral)  Resp 24  Ht 5\' 8"  (1.727 m)  Wt 148 lb (67.132 kg)  BMI 22.50 kg/m2  SpO2 99%  Physical Exam  Nursing note and vitals reviewed. Constitutional: He is oriented to person, place, and time. He appears well-developed and well-nourished. No distress.  HENT:  Head: Normocephalic and atraumatic.  Eyes: Pupils are equal, round, and reactive to light.  Neck: Normal range of motion.  Cardiovascular: Normal rate and intact distal pulses.   Pulmonary/Chest: No respiratory distress.         Pain percussion  Abdominal: Normal appearance. He exhibits no distension.         Area of pain  Musculoskeletal: Normal range of motion.  Neurological: He is alert and oriented to person, place, and time. No cranial nerve deficit.  Skin: Skin is warm and dry. No rash noted.  Psychiatric: He has a normal mood and affect. His behavior is normal.    ED Course  Procedures (including critical care time)  EKG:  Date: 08/03/2012  Rate: 118  Rhythm: Sinus tachycardia  QRS Axis: normal  Intervals: normal  ST/T Wave abnormalities: normal  Conduction Disutrbances: none  Narrative Interpretation: Abnormal  Anion gap = 15     Medications  Azithromycin (ZITHROMAX PO) (not administered)  albuterol (PROVENTIL) (5 MG/ML) 0.5% nebulizer solution 5 mg (5 mg Nebulization Given 08/03/12 1112)  ipratropium (ATROVENT) nebulizer solution 0.5 mg (0.5 mg Nebulization Given 08/03/12 1112)  sodium chloride 0.9 % bolus 1,000 mL (0 mL Intravenous Stopped 08/03/12 1245)  fentaNYL (SUBLIMAZE) injection 100 mcg (100 mcg Intravenous Given  08/03/12 1216)  cefTRIAXone (ROCEPHIN) 1 g in dextrose 5 % 50 mL IVPB (1 g Intravenous New Bag/Given 08/03/12 1318)  0.9 %  sodium chloride infusion (  Intravenous New Bag/Given 08/03/12 1317)     Labs Reviewed  CBC WITH DIFFERENTIAL - Abnormal; Notable for the following:    MCH 36.4 (*)     MCHC 36.5 (*)     Lymphocytes Relative 11 (*)     Monocytes Relative 13 (*)     Monocytes Absolute 1.2 (*)     All other components within normal limits  BASIC METABOLIC PANEL - Abnormal; Notable for the following:    Sodium 134 (*)     Chloride 93 (*)     Glucose, Bld 132 (*)     GFR calc non Af Amer 68 (*)     GFR calc Af Amer 78 (*)     All other components within normal limits  URINALYSIS, ROUTINE W REFLEX MICROSCOPIC - Abnormal; Notable for the following:    Color, Urine ORANGE (*)  BIOCHEMICALS MAY BE AFFECTED BY COLOR   APPearance CLOUDY (*)     Bilirubin Urine MODERATE (*)     Ketones, ur 15 (*)     Protein, ur 100 (*)     Nitrite POSITIVE (*)     Leukocytes, UA TRACE (*)     All other components within normal limits  HEPATIC FUNCTION PANEL - Abnormal; Notable for the following:    Albumin 3.1 (*)     AST 50 (*)     Total Bilirubin 1.6 (*)     Bilirubin, Direct 0.9 (*)     All other components within normal limits  URINE MICROSCOPIC-ADD ON - Abnormal; Notable for the following:    Squamous Epithelial / LPF FEW (*)     Bacteria, UA MANY (*)     Casts HYALINE CASTS (*)  GRANULAR CAST   All other components within normal limits  AMMONIA  URINE CULTURE  CULTURE, BLOOD (ROUTINE X 2)  CULTURE, BLOOD (ROUTINE X 2)   Dg Chest 2 View  08/03/2012  *RADIOLOGY REPORT*  Clinical Data: Chest pain  CHEST - 2 VIEW  Comparison: 07/16/2012  Findings: The heart and pulmonary vascularity are within normal limits.  The lungs are clear bilaterally.  No sizable effusion is seen.  No acute bony abnormality is noted.  IMPRESSION: No acute intrathoracic abnormality.   Original Report Authenticated By:  Alcide Clever, M.D.     Medications  Azithromycin (ZITHROMAX PO) (not administered)  albuterol (PROVENTIL) (5 MG/ML) 0.5% nebulizer solution 5 mg (5 mg Nebulization Given 08/03/12 1112)  ipratropium (ATROVENT) nebulizer solution 0.5 mg (0.5 mg Nebulization Given 08/03/12 1112)  sodium chloride 0.9 % bolus 1,000 mL (0 mL Intravenous Stopped 08/03/12 1245)  fentaNYL (SUBLIMAZE) injection 100 mcg (100 mcg Intravenous Given 08/03/12 1216)  cefTRIAXone (ROCEPHIN) 1 g in dextrose 5 % 50 mL IVPB (1 g Intravenous New Bag/Given 08/03/12 1318)  0.9 %  sodium chloride infusion (  Intravenous New Bag/Given 08/03/12 1317)     1. Urinary tract infection       MDM  Plan: Blood cultures be obtained.  IV antibiotics will be given.  Admission for IV antibiotics and observation.       Nelia Shi, MD 08/03/12 [phone]

## 2012-08-03 NOTE — ED Notes (Signed)
[facility] at bedside preparing for transport 

## 2012-08-03 NOTE — ED Notes (Signed)
Dressing noted to posterior aspect of neck (CDI) from recent I&D

## 2012-08-03 NOTE — ED Notes (Signed)
Pt and family informed of plan of care.  Awaiting bed assignment.

## 2012-08-03 NOTE — Progress Notes (Signed)
Spoke with Dr. Radford Pax from [facility] for transfer/admission of this patient. 66 year old male with delirium started this morning. Patient recently had respiratory congestion on day #2 of a Z-Pak. Patient had bilateral expiratory wheeze but no respiratory distress. Workup in the emergency department suggested dehydration and possibly early UTI. Patient will have sinus tachycardia at 121 which improved to low 100s with fluids. Blood cultures and urine cultures have already been performed in the emergency department. The patient was started on ceftriaxone x1 dose in ED. Patient is going to be admitted for dehydration/mental status change/UTI.  Gary Hua Ellysia [name]

## 2012-08-03 NOTE — H&P (Addendum)
[facility]                                                                                    Patient Demographics  Gary [name], is a 66 y.o. male  CSN: [id number]  MRN: [id number]  DOB - 23-Sep-1946  Admit Date - 08/03/2012  Outpatient Primary MD for the patient is Pamelia Hoit, MD   With History of -  Past Medical History  Diagnosis Date  . Hypertension   . GERD (gastroesophageal reflux disease)   . Pneumonia     "5-7 years ago" (08/03/2012)  . Exertional dyspnea     "just recently" (08/03/2012)  . Delirium, acute 08/03/2012  . Prostate cancer       Past Surgical History  Procedure Date  . Prostate surgery   . Hydrocele excision / repair 2010  . Inguinal hernia repair 12/08/2011    Procedure: HERNIA REPAIR INGUINAL ADULT;  Surgeon: Liz Malady, MD;  Location: [facility] OR;  Service: General;  Laterality: Right;  repair right inguinal hernia with mesh  . Irrigation and debridement abscess 03/01/2012    Procedure: IRRIGATION AND DEBRIDEMENT ABSCESS;  Surgeon: Ernestene Mention, MD;  Location: [facility] OR;  Service: General;  Laterality: Right;  Incision and drainage with debridement right breast abscess  . Tonsillectomy   . Cataract extraction w/ intraocular lens  implant, bilateral 2008    in for   Chief Complaint  Patient presents with  . Shortness of Breath  . Cough  . Abdominal Pain     HPI  Gary [name]  is a 66 y.o. male, who is an alcohol abuser along with one pack a day smoker for the last 60 years, presented to [facility] with confusion, apparently patient has had a dry cough for the last few days and was tried on CPAP for the last 2 days, however he continued to become gradually more confused over the last 4-5 days, he's not a good historian at this point due to his mild confusion, however he states that he's been having some generalized abdominal pain and he has been having no bowel movements in the last 5-6 days which is unusual  for him, he has had a right in 1 hernia surgery a few months ago and he also has left inguinal hernia at this point for which he needs to be operated.  He denies any headaches denies any chest pain or palpitations currently denies any shortness of breath he does agree to a dry cough, denies any focal weakness tingling or numbness.    Review of Systems  unreliable historian due to confusion  In addition to the HPI above,  No Fever-chills, No Headache, No changes with Vision or hearing, No problems swallowing food or Liquids, No Chest pain, Cough or Shortness of Breath, Generalized Abdominal pain, No Nausea or Vommitting, no bowel movements in the last 5-6 days No Blood in stool or Urine, No dysuria, No new skin rashes or bruises, No new joints pains-aches,  No new weakness, tingling, numbness in any extremity, No recent weight gain or loss, No polyuria, polydypsia or polyphagia, No significant Mental Stressors.  A full  10 point Review of Systems was done, except as stated above, all other Review of Systems were negative.   Social History History  Substance Use Topics  . Smoking status: Current Every Day Smoker -- 1.0 packs/day for 52 years    Types: Cigarettes  . Smokeless tobacco: Never Used  . Alcohol Use: 16.8 oz/week    28 Shots of liquor per week     Comment: 08/03/2012 states he drinks 4-5 drinks of liquor daily- last drink 07/30/12     Family History Family History  Problem Relation Age of Onset  . Heart disease Father      Prior to Admission medications   Medication Sig Start Date End Date Taking? Authorizing Provider  Azithromycin (ZITHROMAX PO) Take by mouth.   Yes Historical Provider, MD  aspirin EC 81 MG tablet Take 162 mg by mouth daily.    Historical Provider, MD  cholecalciferol (VITAMIN D) 1000 UNITS tablet Take 1,000 Units by mouth daily.    Historical Provider, MD  folic acid (FOLVITE) 400 MCG tablet Take 400 mcg by mouth daily.    Historical Provider,  MD  lisinopril (PRINIVIL,ZESTRIL) 10 MG tablet Take 10 mg by mouth daily.    Historical Provider, MD  omeprazole (PRILOSEC) 20 MG capsule Take 20 mg by mouth daily.    Historical Provider, MD  vitamin E 400 UNIT capsule Take 800 Units by mouth daily.    Historical Provider, MD    No Known Allergies  Physical Exam  Vitals  Blood pressure 124/70, pulse 98, temperature 98.2 F (36.8 C), temperature source Oral, resp. rate 18, height 5\' 8"  (1.727 m), weight 71.1 kg (156 lb 12 oz), SpO2 96.00%.   1. General frail elderly white male lying in bed in NAD,  2. Normal affect and insight, Not Suicidal or Homicidal, Awake Alert, Oriented X 2.  3. No F.N deficits, ALL C.Nerves Intact, Strength 5/5 all 4 extremities, Sensation intact all 4 extremities, Plantars down going.  4. Ears and Eyes appear Normal, Conjunctivae clear, PERRLA. Moist Oral Mucosa.  5. Supple Neck, No JVD, No cervical lymphadenopathy appriciated, No Carotid Bruits.  6. Symmetrical Chest wall movement, Good air movement bilaterally, CTAB.  7. RRR, No Gallops, Rubs or Murmurs, No Parasternal Heave.  8. Positive Bowel Sounds, Abdomen Soft, mild generalized tenderness, tender left inguinal mass which is nonreducible, No organomegaly appriciated,No rebound -guarding or rigidity.  9.  No Cyanosis, Normal Skin Turgor, No Skin Rash or Bruise.  10. Good muscle tone,  joints appear normal , no effusions, Normal ROM.  11. No Palpable Lymph Nodes in Neck or Axillae     Data Review  CBC  Lab 08/03/12 1110  WBC 9.4  HGB 16.8  HCT 46.0  PLT 152  MCV 99.6  MCH 36.4*  MCHC 36.5*  RDW 13.6  LYMPHSABS 1.0  MONOABS 1.2*  EOSABS 0.2  BASOSABS 0.0  BANDABS --   ------------------------------------------------------------------------------------------------------------------  Chemistries   Lab 08/03/12 1150 08/03/12 1110  NA -- 134*  K -- 3.6  CL -- 93*  CO2 -- 26  GLUCOSE -- 132*  BUN -- 23  CREATININE -- 1.10    CALCIUM -- 9.3  MG -- --  AST 50* --  ALT 22 --  ALKPHOS 96 --  BILITOT 1.6* --   ------------------------------------------------------------------------------------------------------------------ estimated creatinine clearance is 63 ml/min (by C-G formula based on Cr of 1.1). ------------------------------------------------------------------------------------------------------------------ No results found for this basename: TSH,T4TOTAL,FREET3,T3FREE,THYROIDAB in the last 72 hours   Coagulation profile No results  found for this basename: INR:5,PROTIME:5 in the last 168 hours ------------------------------------------------------------------------------------------------------------------- No results found for this basename: DDIMER:2 in the last 72 hours -------------------------------------------------------------------------------------------------------------------  Cardiac Enzymes No results found for this basename: CK:3,CKMB:3,TROPONINI:3,MYOGLOBIN:3 in the last 168 hours ------------------------------------------------------------------------------------------------------------------ No components found with this basename: POCBNP:3   ---------------------------------------------------------------------------------------------------------------  Urinalysis    Component Value Date/Time   COLORURINE ORANGE* 08/03/2012 1119   APPEARANCEUR CLOUDY* 08/03/2012 1119   LABSPEC 1.026 08/03/2012 1119   PHURINE 6.0 08/03/2012 1119   GLUCOSEU NEGATIVE 08/03/2012 1119   HGBUR NEGATIVE 08/03/2012 1119   BILIRUBINUR MODERATE* 08/03/2012 1119   KETONESUR 15* 08/03/2012 1119   PROTEINUR 100* 08/03/2012 1119   UROBILINOGEN 1.0 08/03/2012 1119   NITRITE POSITIVE* 08/03/2012 1119   LEUKOCYTESUR TRACE* 08/03/2012 1119       Imaging results:   Dg Chest 2 View  08/03/2012  *RADIOLOGY REPORT*  Clinical Data: Chest pain  CHEST - 2 VIEW  Comparison: 07/16/2012  Findings: The heart and pulmonary  vascularity are within normal limits.  The lungs are clear bilaterally.  No sizable effusion is seen.  No acute bony abnormality is noted.  IMPRESSION: No acute intrathoracic abnormality.   Original Report Authenticated By: Alcide Clever, M.D.     My personal review of EKG: Rhythm S,Tach in 120s no Acute ST changes     Assessment & Plan  1. Delirium in a patient with history of your alcohol and nicotine abuse. Cause at this time is unclear however patient has been having a dry cough, he does have a UTI, does have left inguinal hernia which is irreducible and has been constipated for 5 days - he certainly could have URI along with UTI, prudent to rule out incarcerated left inguinal hernia.  Patient will be admitted on a telemetry bed, stat CT abdomen pelvis with oral IV contrast will be obtained, have discussed his case with general surgeon Dr. Andrey Campanile who will see the patient shortly, for his UTI/URI I will place him on Zosyn and Levaquin, which should also cover for any incarcerated hernia if that's a possibility, for now he will be n.p.o. except medications with sips of water, IV fluids, urine and blood cultures will be obtained. We'll obtain a baseline ABG, ammonia level is stable. Fall & Aspiration precautions, HOB elevated, Bed alarm.    2. Alcohol and nicotine abuse. Patient has been counseled to quit both, will place him on Librium, folate acid and thiamine, CIWA protocol.    3. Hypertension- no acute issues we'll place him on low-dose beta blocker and monitor.    4. Sinus tachycardia upon presentation to [facility] regional ER, currently much improved, gentle IV fluids, pain control, gentle beta blocker and monitor.    5. History of GERD continue home dose PPI.     DVT Prophylaxis   SCDs   AM Labs Ordered, also please review Full Orders  Family Communication: Admission, patients condition and plan of care including tests being ordered have been discussed with the patient who  indicates understanding and agree with the plan and Code Status.  Left message on patient's wife Debbie's cell phone to call us back for update, on 08/03/2012 @ 6:20 PM.  Code Status Full  Disposition Plan: TBD  Time spent in minutes : 35  Condition GUARDED   Leroy Sea M.D on 08/03/2012 at 6:06 PM  Between 7am to 7pm - Pager - [phone]  After 7pm go to [email or web address] - password [email or web address]  And look for the night coverage person covering me  after hours  [facility]  [phone]

## 2012-08-03 NOTE — Progress Notes (Signed)
ANTIBIOTIC CONSULT NOTE - INITIAL  Pharmacy Consult for Zosyn Indication: UTI coverage  No Known Allergies  Patient Measurements: Height: 5\' 8"  (172.7 cm) Weight: 156 lb 12 oz (71.1 kg) IBW/kg (Calculated) : 68.4   Vital Signs: Temp: 98.2 F (36.8 C) (11/05 1700) Temp src: Oral (11/05 1700) BP: 124/70 mmHg (11/05 1700) Pulse Rate: 98  (11/05 1700)  Intake/Output from this shift: Total I/O In: 1050 [I.V.:1050] Out: -   Labs:  Basename 08/03/12 1110  WBC 9.4  HGB 16.8  PLT 152  LABCREA --  CREATININE 1.10   Estimated Creatinine Clearance: 63 ml/min (by C-G formula based on Cr of 1.1).  Microbiology:   11/5 - blood cultures x 2 -   11/5 - urine culture -   Medical History: Past Medical History  Diagnosis Date  . Hypertension   . GERD (gastroesophageal reflux disease)   . Pneumonia     "5-7 years ago" (08/03/2012)  . Exertional dyspnea     "just recently" (08/03/2012)  . Delirium, acute 08/03/2012  . Prostate cancer    Assessment:   66 yr old man admitted with confusion/delerium, generalized abdominal pain, dry cough.  Also has left inguinal hernia and has been constipated for 5 days.   Blood and urine cultures ordered.   Also starting Levaquin for coverage for possible incarcerated hernia.  Goal of Therapy:    Appropriate Zosyn dose for renal function and infection  Plan:    Zosyn 3.375 grams IV q8hrs (each infused over 4 hours).   Will follow up renal function, culture data and clinical status.    Dennie Fetters, [place] Pager: [phone] 08/03/2012,6:39 PM

## 2012-08-04 DIAGNOSIS — K4090 Unilateral inguinal hernia, without obstruction or gangrene, not specified as recurrent: Secondary | ICD-10-CM

## 2012-08-04 LAB — BASIC METABOLIC PANEL
BUN: 16 mg/dL (ref 6–23)
Calcium: 8.7 mg/dL (ref 8.4–10.5)
Chloride: 94 mEq/L — ABNORMAL LOW (ref 96–112)
Creatinine, Ser: 0.89 mg/dL (ref 0.50–1.35)
GFR calc Af Amer: >90 mL/min (ref >90)
GFR calc non Af Amer: 87 mL/min — ABNORMAL LOW (ref >90)

## 2012-08-04 LAB — CBC
HCT: 44.7 % (ref 39.0–52.0)
MCHC: 35.6 g/dL (ref 30.0–36.0)
Platelets: 152 10*3/uL (ref 150–400)
RDW: 14.2 % (ref 11.5–15.5)
WBC: 8.6 10*3/uL (ref 4.0–10.5)

## 2012-08-04 LAB — URINE CULTURE

## 2012-08-04 MED ORDER — LORAZEPAM 2 MG/ML IJ SOLN
1.0000 mg | Freq: Once | INTRAMUSCULAR | Status: AC
  Administered 2012-08-04: 1 mg via INTRAVENOUS
  Filled 2012-08-04: qty 1

## 2012-08-04 MED ORDER — LORAZEPAM 1 MG PO TABS
1.0000 mg | ORAL_TABLET | Freq: Four times a day (QID) | ORAL | Status: DC | PRN
  Administered 2012-08-04 – 2012-08-05 (×2): 1 mg via ORAL
  Filled 2012-08-04 (×2): qty 1

## 2012-08-04 MED ORDER — LORAZEPAM 2 MG/ML IJ SOLN: 2.0000 mg | Freq: Four times a day (QID) | INTRAMUSCULAR | Status: DC | PRN

## 2012-08-04 NOTE — Progress Notes (Signed)
Brief Nutrition Note:  Pt indicated unintentional weight loss and poor appetite generating a MST (Malnutrition Screening Tool) score of 2.    Weight hx:  Wt Readings from Last 5 Encounters:  08/03/12 156 lb 12 oz (71.1 kg)  04/08/12 158 lb 4 oz (71.782 kg)  03/01/12 157 lb 12.8 oz (71.578 kg)  03/01/12 157 lb 12.8 oz (71.578 kg)  12/24/11 162 lb 9.6 oz (73.755 kg)   Weight loss is not significant.   Current diet is NPO for procedure.   Pt was sleeping soundly at time of RD visit, did not wake to name call x3.    Chart reviewed, no nutrition interventions warranted at this time, please consult as needed.   Clarene Duke RD, LDN Pager [phone] After Hours pager [phone]

## 2012-08-04 NOTE — Progress Notes (Signed)
Subjective: No complaints.  Objective: Vital signs in last 24 hours: Temp:  [97.9 F (36.6 C)-98.4 F (36.9 C)] 98.4 F (36.9 C) (11/06 0504) Pulse Rate:  [93-121] 98  (11/06 0504) Resp:  [18-24] 18  (11/06 0504) BP: (112-148)/(70-94) 112/78 mmHg (11/06 0504) SpO2:  [94 %-100 %] 94 % (11/06 0504) Weight:  [148 lb (67.132 kg)-156 lb 12 oz (71.1 kg)] 156 lb 12 oz (71.1 kg) (11/05 1700) Last BM Date: 07/27/12  Intake/Output from previous day: 11/05 0701 - 11/06 0700 In: 1911.7 [I.V.:1911.7] Out: 100 [Urine:100] Intake/Output this shift:    GI: incarcerated LIH.  not reducible but not significantly tender. no redness.  no peritonitis or distension.  Lab Results:   Basename 08/04/12 0650 08/03/12 1110  WBC 8.6 9.4  HGB 15.9 16.8  HCT 44.7 46.0  PLT 152 152   BMET  Basename 08/04/12 0650 08/03/12 1110  NA 131* 134*  K 3.7 3.6  CL 94* 93*  CO2 25 26  GLUCOSE 103* 132*  BUN 16 23  CREATININE 0.89 1.10  CALCIUM 8.7 9.3   PT/INR  Basename 08/03/12 1811  LABPROT 15.0  INR 1.20   ABG  Basename 08/03/12 1751  PHART 7.494*  HCO3 22.2    Studies/Results: Dg Chest 2 View  08/03/2012  *RADIOLOGY REPORT*  Clinical Data: Chest pain  CHEST - 2 VIEW  Comparison: 07/16/2012  Findings: The heart and pulmonary vascularity are within normal limits.  The lungs are clear bilaterally.  No sizable effusion is seen.  No acute bony abnormality is noted.  IMPRESSION: No acute intrathoracic abnormality.   Original Report Authenticated By: Alcide Clever, M.D.    Ct Abdomen Pelvis W Contrast  08/03/2012  *RADIOLOGY REPORT*  Clinical Data: Left inguinal mass.  Right upper quadrant abdominal pain.  Prior history of inguinal hernia repair and hydrocele repair.  CT ABDOMEN AND PELVIS WITH CONTRAST  Technique:  Multidetector CT imaging of the abdomen and pelvis was performed following the standard protocol during bolus administration of intravenous contrast.  Contrast: OMNIPAQUE  IOHEXOL 300 MG/ML. Oral contrast was also administered.  Comparison: CT abdomen and pelvis 07/20/2011.  Findings: Left inguinal hernia containing fat, though there is marked edema/inflammation within the herniated fat.  Interval repair of the previously identified right inguinal hernia, without evidence of recurrence.  Small to moderate amount of upper abdominal ascites in the perihepatic and perisplenic region and in the gastrohepatic ligament.  Scattered areas of focal hepatic steatosis.  No hepatic masses.  Gallbladder contracted and contains echogenic material consistent with sludge and/or small gallstones, not visualized on the prior examination.  Normal-appearing spleen. Calcifications within the head of the pancreas and in the uncinate region, without evidence of acute pancreatitis.  Normal adrenal glands and kidneys. 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  No significant lymphadenopathy.  Stable small hiatal hernia.  Stomach decompressed and otherwise unremarkable.  Normal-appearing small bowel.  Hyperemia and inflammation involving the proximal sigmoid colon; this inflammation surrounding the sigmoid colon involves the fat within the left inguinal hernia described above.  Small amount of ascites dependently in the pelvis.  Urinary bladder decompressed and unremarkable.  Prior prostatectomy without evidence of recurrent tumor mass in the prostate bed.  Visualized lung bases clear apart from the expected dependent atelectasis posteriorly.  Bone window images demonstrate mild degenerative changes involving the lower thoracic and lumbar spine but no evidence of osseous metastatic disease.  Degenerative changes are present in the sacroiliac joints with ankylosis on the right.  IMPRESSION:  1.  Left inguinal hernia containing indurated, inflamed fat.  This indurated fat may be secondary to sigmoid diverticulitis. 2.  Small to moderate amount of intra-abdominal and pelvic ascites.  3.   Scattered areas of focal hepatic steatosis. 4.  Sludge and/or small gallstones within the contracted gallbladder. 5.  Chronic calcific pancreatitis. 6.  Stable small hiatal hernia. 7.  Interval right inguinal hernia repair without recurrence. 8.  Prior prostatectomy without evidence of recurrent tumor in the prostate bed.   Original Report Authenticated By: Hulan Saas, M.D.     Anti-infectives: Anti-infectives     Start     Dose/Rate Route Frequency Ordered Stop   08/03/12 2200   piperacillin-tazobactam (ZOSYN) IVPB 3.375 g        3.375 g 12.5 mL/hr over 240 Minutes Intravenous 3 times per day 08/03/12 2014     08/03/12 2000   levofloxacin (LEVAQUIN) IVPB 500 mg        500 mg 100 mL/hr over 60 Minutes Intravenous Every 24 hours 08/03/12 1810     08/03/12 1900   piperacillin-tazobactam (ZOSYN) IVPB 3.375 g  Status:  Discontinued        3.375 g 12.5 mL/hr over 240 Minutes Intravenous Every 8 hours 08/03/12 1814 08/03/12 2014   08/03/12 1800   levofloxacin (LEVAQUIN) IVPB 500 mg  Status:  Discontinued        500 mg 100 mL/hr over 60 Minutes Intravenous Every 24 hours 08/03/12 1756 08/03/12 1801   08/03/12 1315   cefTRIAXone (ROCEPHIN) 1 g in dextrose 5 % 50 mL IVPB        1 g 100 mL/hr over 30 Minutes Intravenous  Once 08/03/12 1302 08/03/12 1420          Assessment/Plan: LIH  Incarcerated with fat. Patient Active Problem List  Diagnosis  . Right inguinal hernia  . Breast abscess in male  . HTN (hypertension)  . Abdominal pain  . Alcohol abuse  . Smoking history  . Left inguinal hernia  Cont ABX  For urine.  Repair in next 24 - 48 hours once UTI clear.  LOS: 1 day    Gary Karge A. 08/04/2012

## 2012-08-04 NOTE — Progress Notes (Signed)
Patient ID: Gary [name], male   DOB: 1945/12/16, 66 y.o.   MRN: [id number] [facility] PROGRESS NOTE  Gary [name] [id number] DOB: May 08, 1946 DOA: 08/03/2012 PCP: Pamelia Hoit, MD  Assessment/Plan:  1. Delirium  -Multifactorial, likely related to EtOH abuse and possible infection (diverticulitis, UTI, URI) iLikely related to infection.  - Improving with antibiotic therapy and benzodiazepines.  2.  Left inguinal hernia  Endurated inflamed Fat. On antibiotic therapy (Zosyn) Plan per Surgery for OR in next 24 to 48 hours. Will receive clear liquid dinner and be NPO after midnight except for meds.  3.  UTI On Zosyn Cultures Pending  4.  Alcohol Abuse Patient describes things "wiggling on the walls" CIWA / Librium protocol to prevent DTs. Claims last drink 4 days ago, but doubt reliability of this history  5.  Hx of HTN Normotensive at this point.  On metoprolol 25 mg BID  6.  Sinus Tach 100 - 110.  Controlled with metoprolol Will continue to monitor on tele bed.   Code Status: full  Disposition Plan: Inpatient   Consultants:  General Surgery  Procedures:  None  Antibiotics:  Zosyn and Levaquin started at admission.  Reduced to Zosyn today.  HPI/Subjective: Gary [name] is a 66 y.o. male, who is an alcohol abuser along with one pack a day smoker for the last 60 years, presented to [facility] with confusion, apparently patient has had a dry cough for the last few days and was tried on CPAP for the last 2 days, however he continued to become gradually more confused over the last 4-5 days, he's not a good historian at this point due to his mild confusion, however he states that he's been having some generalized abdominal pain and he has been having no bowel movements in the last 5-6 days which is unusual for him, he has had a right in 1 hernia surgery a few months ago and he also has left inguinal hernia at this point for which he needs  to be operated.   Objective: Filed Vitals:   08/04/12 0355 08/04/12 0504 08/04/12 1009 08/04/12 1344  BP: 142/72 112/78 136/81 111/67  Pulse: 93 98 102 90  Temp:  98.4 F (36.9 C)  98.8 F (37.1 C)  TempSrc:  Oral  Oral  Resp:  18  18  Height:      Weight:      SpO2:  94%  90%    Intake/Output Summary (Last 24 hours) at 08/04/12 1555 Last data filed at 08/04/12 1324  Gross per 24 hour  Intake 861.67 ml  Output    150 ml  Net 711.67 ml   Filed Weights   08/03/12 1111 08/03/12 1700  Weight: 67.132 kg (148 lb) 71.1 kg (156 lb 12 oz)    Exam:   General:  Awake, mildly confused, pleasant  Cardiovascular: RRR no M/R/G  Respiratory: CTA no W/C/R  Abdomen: Firm in upper right quadrant, TTP in central abdomen. I do not hear bowel sounds.  (but patient reports his bowel movements have improved)  Data Reviewed: Basic Metabolic Panel:  Lab 08/04/12 5784 08/03/12 1110  NA 131* 134*  K 3.7 3.6  CL 94* 93*  CO2 25 26  GLUCOSE 103* 132*  BUN 16 23  CREATININE 0.89 1.10  CALCIUM 8.7 9.3  MG -- --  PHOS -- --   Liver Function Tests:  Lab 08/03/12 1150  AST 50*  ALT 22  ALKPHOS 96  BILITOT 1.6*  PROT 7.9  ALBUMIN 3.1*    Lab 08/03/12 1215  AMMONIA 25   CBC:  Lab 08/04/12 0650 08/03/12 1110  WBC 8.6 9.4  NEUTROABS -- 7.0  HGB 15.9 16.8  HCT 44.7 46.0  MCV 102.5* 99.6  PLT 152 152     Recent Results (from the past 240 hour(s))  CULTURE, BLOOD (ROUTINE X 2)     Status: Normal (Preliminary result)   Collection Time   08/03/12  1:00 PM      Component Value Range Status Comment   Specimen Description Blood   Final    Special Requests NONE   Final    Culture  Setup Time 08/03/2012 15:36   Final    Culture     Final    Value:        BLOOD CULTURE RECEIVED NO GROWTH TO DATE CULTURE WILL BE HELD FOR 5 DAYS BEFORE ISSUING A FINAL NEGATIVE REPORT   Report Status PENDING   Incomplete   CULTURE, BLOOD (ROUTINE X 2)     Status: Normal (Preliminary result)    Collection Time   08/03/12  1:05 PM      Component Value Range Status Comment   Specimen Description Blood   Final    Special Requests NONE   Final    Culture  Setup Time 08/03/2012 15:36   Final    Culture     Final    Value:        BLOOD CULTURE RECEIVED NO GROWTH TO DATE CULTURE WILL BE HELD FOR 5 DAYS BEFORE ISSUING A FINAL NEGATIVE REPORT   Report Status PENDING   Incomplete      Studies: Dg Chest 2 View  08/03/2012  *RADIOLOGY REPORT*  Clinical Data: Chest pain  CHEST - 2 VIEW  Comparison: 07/16/2012  Findings: The heart and pulmonary vascularity are within normal limits.  The lungs are clear bilaterally.  No sizable effusion is seen.  No acute bony abnormality is noted.  IMPRESSION: No acute intrathoracic abnormality.   Original Report Authenticated By: Alcide Clever, M.D.    Ct Abdomen Pelvis W Contrast  08/03/2012  *RADIOLOGY REPORT*  Clinical Data: Left inguinal mass.  Right upper quadrant abdominal pain.  Prior history of inguinal hernia repair and hydrocele repair.  CT ABDOMEN AND PELVIS WITH CONTRAST  Technique:  Multidetector CT imaging of the abdomen and pelvis was performed following the standard protocol during bolus administration of intravenous contrast.  Contrast: OMNIPAQUE IOHEXOL 300 MG/ML. Oral contrast was also administered.  Comparison: CT abdomen and pelvis 07/20/2011.  Findings: Left inguinal hernia containing fat, though there is marked edema/inflammation within the herniated fat.  Interval repair of the previously identified right inguinal hernia, without evidence of recurrence.  Small to moderate amount of upper abdominal ascites in the perihepatic and perisplenic region and in the gastrohepatic ligament.  Scattered areas of focal hepatic steatosis.  No hepatic masses.  Gallbladder contracted and contains echogenic material consistent with sludge and/or small gallstones, not visualized on the prior examination.  Normal-appearing spleen. Calcifications within the head  of the pancreas and in the uncinate region, without evidence of acute pancreatitis.  Normal adrenal glands and kidneys. 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  No significant lymphadenopathy.  Stable small hiatal hernia.  Stomach decompressed and otherwise unremarkable.  Normal-appearing small bowel.  Hyperemia and inflammation involving the proximal sigmoid colon; this inflammation surrounding the sigmoid colon involves the fat within the left inguinal hernia described above.  Small amount of ascites  dependently in the pelvis.  Urinary bladder decompressed and unremarkable.  Prior prostatectomy without evidence of recurrent tumor mass in the prostate bed.  Visualized lung bases clear apart from the expected dependent atelectasis posteriorly.  Bone window images demonstrate mild degenerative changes involving the lower thoracic and lumbar spine but no evidence of osseous metastatic disease.  Degenerative changes are present in the sacroiliac joints with ankylosis on the right.  IMPRESSION:  1.  Left inguinal hernia containing indurated, inflamed fat.  This indurated fat may be secondary to sigmoid diverticulitis. 2.  Small to moderate amount of intra-abdominal and pelvic ascites.  3.  Scattered areas of focal hepatic steatosis. 4.  Sludge and/or small gallstones within the contracted gallbladder. 5.  Chronic calcific pancreatitis. 6.  Stable small hiatal hernia. 7.  Interval right inguinal hernia repair without recurrence. 8.  Prior prostatectomy without evidence of recurrent tumor in the prostate bed.   Original Report Authenticated By: Hulan Saas, M.D.     Scheduled Meds:   . aspirin EC  162 mg Oral Daily  . chlordiazePOXIDE  10 mg Oral TID  . folic acid  1 mg Oral Daily  . [EXPIRED] iohexol  20 mL Oral Q1 Hr x 2  . [COMPLETED] LORazepam  1 mg Intravenous Once  . metoprolol tartrate  25 mg Oral BID  . multivitamin with minerals  1 tablet Oral Daily  . pantoprazole  40 mg Oral  Daily  . piperacillin-tazobactam (ZOSYN)  IV  3.375 g Intravenous Q8H  . [COMPLETED] ranitidine  150 mg Oral Once  . sodium chloride  3 mL Intravenous Q12H  . thiamine  100 mg Oral Daily   Or  . thiamine  100 mg Intravenous Daily  . [COMPLETED] sodium chloride   Intravenous STAT  . [DISCONTINUED] chlordiazePOXIDE  10 mg Oral TID  . [DISCONTINUED] levofloxacin (LEVAQUIN) IV  500 mg Intravenous Q24H  . [DISCONTINUED] levofloxacin (LEVAQUIN) IV  500 mg Intravenous Q24H  . [DISCONTINUED] piperacillin-tazobactam (ZOSYN)  IV  3.375 g Intravenous Q8H   Continuous Infusions:   . dextrose 5 % and 0.45 % NaCl with KCl 10 mEq/L 100 mL/hr at 08/04/12 0500    Active Problems:  Right inguinal hernia  Breast abscess in male  HTN (hypertension)  Abdominal pain  Alcohol abuse  Smoking history  Left inguinal hernia    Time spent: 30 min.    Stephani Police  [facility] Pager [phone]. If 8PM-8AM, please contact night-coverage at [email or web address], password [facility] 08/04/2012, 3:55 PM  LOS: 1 day    Attending Patient seen and examined, above documentation reviewed, necessary edits have been done. Patient continues to have some delirium presumably likely secondary to alcohol withdrawal, surgery is following this patient and planning for repair of hernia as his alcohol withdrawal issues resolved. In the meantime continue with Zosyn.  Dr Windell Norfolk

## 2012-08-04 NOTE — Progress Notes (Signed)
Paged MD on call about pt's CT results. RN will continue to monitor pt and keep him NPO

## 2012-08-05 ENCOUNTER — Inpatient Hospital Stay (HOSPITAL_COMMUNITY): Payer: Medicare Other

## 2012-08-05 ENCOUNTER — Encounter (HOSPITAL_COMMUNITY): Payer: Self-pay | Admitting: Anesthesiology

## 2012-08-05 LAB — BASIC METABOLIC PANEL
CO2: 21 mEq/L (ref 19–32)
Chloride: 95 mEq/L — ABNORMAL LOW (ref 96–112)
Creatinine, Ser: 0.85 mg/dL (ref 0.50–1.35)
GFR calc Af Amer: >90 mL/min (ref >90)
Potassium: 3.5 mEq/L (ref 3.5–5.1)
Sodium: 132 mEq/L — ABNORMAL LOW (ref 135–145)

## 2012-08-05 LAB — BLOOD GAS, ARTERIAL
Acid-base deficit: 3.7 mmol/L — ABNORMAL HIGH (ref 0.0–2.0)
Drawn by: 246101
FIO2: 0.21 %
O2 Saturation: 92.6 %
pCO2 arterial: 25.3 mmHg — ABNORMAL LOW (ref 35.0–45.0)
pO2, Arterial: 62.3 mmHg — ABNORMAL LOW (ref 80.0–100.0)

## 2012-08-05 LAB — CBC
MCV: 100.7 fL — ABNORMAL HIGH (ref 78.0–100.0)
Platelets: 146 10*3/uL — ABNORMAL LOW (ref 150–400)
RBC: 4.41 MIL/uL (ref 4.22–5.81)
RDW: 14.1 % (ref 11.5–15.5)
WBC: 10.2 10*3/uL (ref 4.0–10.5)

## 2012-08-05 LAB — LACTIC ACID, PLASMA: Lactic Acid, Venous: 2.2 mmol/L (ref 0.5–2.2)

## 2012-08-05 NOTE — Progress Notes (Signed)
Subjective: Pt very lethargic and slow to respond today.  Wife at bedside says he's finally able to get some rest this morning.  Wife says he was up ambulating OOB and to the chair yesterday.  Pt is unsure if he's had a BM or flatus.  Pt states pain is minimal in LLQ and coughing.  Objective: Vital signs in last 24 hours: Temp:  [98.8 F (37.1 C)-99.2 F (37.3 C)] 99.2 F (37.3 C) (11/06 2114) Pulse Rate:  [90-94] 91  (11/06 2114) Resp:  [18-20] 20  (11/06 2114) BP: (111-148)/(67-86) 142/86 mmHg (11/06 2114) SpO2:  [90 %-92 %] 92 % (11/06 2114) Last BM Date: 08/04/12  Intake/Output from previous day: 11/06 0701 - 11/07 0700 In: 1450 [I.V.:1400; IV Piggyback:50] Out: 50 [Urine:50] Intake/Output this shift:    PE: Gen:  Sleepy, mildly confused, pleasant Card:  RRR, no murmur heard Pulm:  CTA, no wheezing, decreased effort Abd: Soft, mild tenderness and distension, +BS   Lab Results:   Basename 08/04/12 0650 08/03/12 1110  WBC 8.6 9.4  HGB 15.9 16.8  HCT 44.7 46.0  PLT 152 152   BMET  Basename 08/04/12 0650 08/03/12 1110  NA 131* 134*  K 3.7 3.6  CL 94* 93*  CO2 25 26  GLUCOSE 103* 132*  BUN 16 23  CREATININE 0.89 1.10  CALCIUM 8.7 9.3   PT/INR  Basename 08/03/12 1811  LABPROT 15.0  INR 1.20   CMP     Component Value Date/Time   NA 131* 08/04/2012 0650   K 3.7 08/04/2012 0650   CL 94* 08/04/2012 0650   CO2 25 08/04/2012 0650   GLUCOSE 103* 08/04/2012 0650   BUN 16 08/04/2012 0650   CREATININE 0.89 08/04/2012 0650   CALCIUM 8.7 08/04/2012 0650   PROT 7.9 08/03/2012 1150   ALBUMIN 3.1* 08/03/2012 1150   AST 50* 08/03/2012 1150   ALT 22 08/03/2012 1150   ALKPHOS 96 08/03/2012 1150   BILITOT 1.6* 08/03/2012 1150   GFRNONAA 87* 08/04/2012 0650   GFRAA >90 08/04/2012 0650   Lipase     Component Value Date/Time   LIPASE 27 07/20/2011 0915       Studies/Results: Dg Chest 2 View  08/03/2012  *RADIOLOGY REPORT*  Clinical Data: Chest pain  CHEST - 2 VIEW   Comparison: 07/16/2012  Findings: The heart and pulmonary vascularity are within normal limits.  The lungs are clear bilaterally.  No sizable effusion is seen.  No acute bony abnormality is noted.  IMPRESSION: No acute intrathoracic abnormality.   Original Report Authenticated By: Alcide Clever, M.D.    Ct Abdomen Pelvis W Contrast  08/03/2012  *RADIOLOGY REPORT*  Clinical Data: Left inguinal mass.  Right upper quadrant abdominal pain.  Prior history of inguinal hernia repair and hydrocele repair.  CT ABDOMEN AND PELVIS WITH CONTRAST  Technique:  Multidetector CT imaging of the abdomen and pelvis was performed following the standard protocol during bolus administration of intravenous contrast.  Contrast: OMNIPAQUE IOHEXOL 300 MG/ML. Oral contrast was also administered.  Comparison: CT abdomen and pelvis 07/20/2011.  Findings: Left inguinal hernia containing fat, though there is marked edema/inflammation within the herniated fat.  Interval repair of the previously identified right inguinal hernia, without evidence of recurrence.  Small to moderate amount of upper abdominal ascites in the perihepatic and perisplenic region and in the gastrohepatic ligament.  Scattered areas of focal hepatic steatosis.  No hepatic masses.  Gallbladder contracted and contains echogenic material consistent with sludge and/or small  gallstones, not visualized on the prior examination.  Normal-appearing spleen. Calcifications within the head of the pancreas and in the uncinate region, without evidence of acute pancreatitis.  Normal adrenal glands and kidneys. 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  No significant lymphadenopathy.  Stable small hiatal hernia.  Stomach decompressed and otherwise unremarkable.  Normal-appearing small bowel.  Hyperemia and inflammation involving the proximal sigmoid colon; this inflammation surrounding the sigmoid colon involves the fat within the left inguinal hernia described above.   Small amount of ascites dependently in the pelvis.  Urinary bladder decompressed and unremarkable.  Prior prostatectomy without evidence of recurrent tumor mass in the prostate bed.  Visualized lung bases clear apart from the expected dependent atelectasis posteriorly.  Bone window images demonstrate mild degenerative changes involving the lower thoracic and lumbar spine but no evidence of osseous metastatic disease.  Degenerative changes are present in the sacroiliac joints with ankylosis on the right.  IMPRESSION:  1.  Left inguinal hernia containing indurated, inflamed fat.  This indurated fat may be secondary to sigmoid diverticulitis. 2.  Small to moderate amount of intra-abdominal and pelvic ascites.  3.  Scattered areas of focal hepatic steatosis. 4.  Sludge and/or small gallstones within the contracted gallbladder. 5.  Chronic calcific pancreatitis. 6.  Stable small hiatal hernia. 7.  Interval right inguinal hernia repair without recurrence. 8.  Prior prostatectomy without evidence of recurrent tumor in the prostate bed.   Original Report Authenticated By: Hulan Saas, M.D.     Anti-infectives: Anti-infectives     Start     Dose/Rate Route Frequency Ordered Stop   08/03/12 2200  piperacillin-tazobactam (ZOSYN) IVPB 3.375 g       3.375 g 12.5 mL/hr over 240 Minutes Intravenous 3 times per day 08/03/12 2014     08/03/12 2000   levofloxacin (LEVAQUIN) IVPB 500 mg  Status:  Discontinued        500 mg 100 mL/hr over 60 Minutes Intravenous Every 24 hours 08/03/12 1810 08/04/12 0913   08/03/12 1900   piperacillin-tazobactam (ZOSYN) IVPB 3.375 g  Status:  Discontinued        3.375 g 12.5 mL/hr over 240 Minutes Intravenous Every 8 hours 08/03/12 1814 08/03/12 2014   08/03/12 1800   levofloxacin (LEVAQUIN) IVPB 500 mg  Status:  Discontinued        500 mg 100 mL/hr over 60 Minutes Intravenous Every 24 hours 08/03/12 1756 08/03/12 1801   08/03/12 1315   cefTRIAXone (ROCEPHIN) 1 g in dextrose 5  % 50 mL IVPB        1 g 100 mL/hr over 30 Minutes Intravenous  Once 08/03/12 1302 08/03/12 1420           Assessment/Plan 66 y/o male with Left inguinal hernia incarcerated with fat 1.  Discussed with wife and patient about potential surgery tomorrow if patient is medically stable 2.  Pt understands he needs an operation and consents to have the hernia repaired 3.  Pt is very lethargic today, hopefully tomorrow he will be in better shape for surgery, if not we have a low threshold to postpone 4.  Cont antibiotics for UTI 5.  Ambulation and IS!!! 6.  Clears and NPO after midnight for possible surgery      LOS: 2 days    [name], Gary Gilliam 08/05/2012, 10:21 AM Pager: [phone]

## 2012-08-05 NOTE — Progress Notes (Signed)
[facility] PROGRESS NOTE  Gary [name] QIO:[id number] DOB: 09-Feb-1946 DOA: 08/03/2012 PCP: Pamelia Hoit, MD  2:30 pm addendum Called to room by RN.  Patient had recently been very agitated - throwing his food tray to the ground.  CIWA score at 10. When I came into the room patient was sitting in chair appearing lethargic and out of it. He is awaking. Toying with his telemetry box.  Will check ABG and Lactic acid.  Benzodiazepines held.  Assessment/Plan:  1. Delirium  Multifactorial, likely related to EtOH abuse and possible infection (diverticulitis, UTI, URI) Likely related to infection.  Improving with antibiotic therapy and benzodiazepines. Urine culture negative Blood cultures negative to date.  Final results pending.  2.  Left inguinal hernia  Endurated inflamed Fat. On antibiotic therapy (Zosyn) Plan per Surgery for OR in next 24 to 48 hours. Will receive clear liquid diet.  3.  Chest congestion Repeat chest xray Cough q 4 hours Ambulate On Zosyn and aspiration precautions.  4.  UTI Cultures negative. Will continue on Zosyn given inflamed indurated fat within hernia.  5.  Alcohol Abuse Patient lethargic and disagreeable today.  Monitor closely for DTs. CIWA / Librium protocol to prevent DTs. Claims last drink 4 days ago, but doubt reliability of this history  6.  Hx of HTN Normotensive at this point.  On metoprolol 25 mg BID  7.  Sinus Tach Controlled with metoprolol Will continue to monitor on tele bed.   Code Status: full Disposition Plan: Inpatient   Consultants:  General Surgery  Procedures:  None  Antibiotics:  Zosyn and Levaquin started at admission.  Reduced to Zosyn today.  HPI/Subjective: Gary [name] is a 66 y.o. male, who is an alcohol abuser along with one pack a day smoker for the last 60 years, presented to [facility] with confusion, apparently patient has had a dry cough for the last few days and was  tried on CPAP for the last 2 days, however he continued to become gradually more confused over the last 4-5 days, he's not a good historian at this point due to his mild confusion, however he states that he's been having some generalized abdominal pain and he has been having no bowel movements in the last 5-6 days which is unusual for him, he has had a right in 1 hernia surgery a few months ago and he also has left inguinal hernia at this point for which he needs to be operated.   Objective: Filed Vitals:   08/04/12 1344 08/04/12 1710 08/04/12 2114 08/05/12 1036  BP: 111/67 148/83 142/86 135/85  Pulse: 90 94 91 88  Temp: 98.8 F (37.1 C)  99.2 F (37.3 C) 98.9 F (37.2 C)  TempSrc: Oral  Oral Oral  Resp: 18  20 20   Height:      Weight:      SpO2: 90%  92% 95%    Intake/Output Summary (Last 24 hours) at 08/05/12 1130 Last data filed at 08/05/12 0715  Gross per 24 hour  Intake   1450 ml  Output    150 ml  Net   1300 ml   Filed Weights   08/03/12 1111 08/03/12 1700  Weight: 67.132 kg (148 lb) 71.1 kg (156 lb 12 oz)    Exam:   General:  Awake, mildly confused, pleasant  Cardiovascular: RRR no M/R/G  Respiratory: CTA no W/C/R  Abdomen: Firm in upper right quadrant, TTP in central abdomen. I do not hear bowel sounds.  (  but patient reports his bowel movements have improved)  Data Reviewed: Basic Metabolic Panel:  Lab 08/05/12 1610 08/04/12 0650 08/03/12 1110  NA 132* 131* 134*  K 3.5 3.7 3.6  CL 95* 94* 93*  CO2 21 25 26   GLUCOSE 91 103* 132*  BUN 15 16 23   CREATININE 0.85 0.89 1.10  CALCIUM 8.7 8.7 9.3  MG -- -- --  PHOS -- -- --   Liver Function Tests:  Lab 08/03/12 1150  AST 50*  ALT 22  ALKPHOS 96  BILITOT 1.6*  PROT 7.9  ALBUMIN 3.1*    Lab 08/03/12 1215  AMMONIA 25   CBC:  Lab 08/05/12 1013 08/04/12 0650 08/03/12 1110  WBC 10.2 8.6 9.4  NEUTROABS -- -- 7.0  HGB 16.2 15.9 16.8  HCT 44.4 44.7 46.0  MCV 100.7* 102.5* 99.6  PLT 146* 152 152      Recent Results (from the past 240 hour(s))  URINE CULTURE     Status: Normal   Collection Time   08/03/12 11:19 AM      Component Value Range Status Comment   Specimen Description URINE, CLEAN CATCH   Final    Special Requests NONE   Final    Culture  Setup Time 08/04/2012 01:55   Final    Colony Count NO GROWTH   Final    Culture NO GROWTH   Final    Report Status 08/04/2012 FINAL   Final   CULTURE, BLOOD (ROUTINE X 2)     Status: Normal (Preliminary result)   Collection Time   08/03/12  1:00 PM      Component Value Range Status Comment   Specimen Description Blood   Final    Special Requests NONE   Final    Culture  Setup Time 08/03/2012 15:36   Final    Culture     Final    Value:        BLOOD CULTURE RECEIVED NO GROWTH TO DATE CULTURE WILL BE HELD FOR 5 DAYS BEFORE ISSUING A FINAL NEGATIVE REPORT   Report Status PENDING   Incomplete   CULTURE, BLOOD (ROUTINE X 2)     Status: Normal (Preliminary result)   Collection Time   08/03/12  1:05 PM      Component Value Range Status Comment   Specimen Description Blood   Final    Special Requests NONE   Final    Culture  Setup Time 08/03/2012 15:36   Final    Culture     Final    Value:        BLOOD CULTURE RECEIVED NO GROWTH TO DATE CULTURE WILL BE HELD FOR 5 DAYS BEFORE ISSUING A FINAL NEGATIVE REPORT   Report Status PENDING   Incomplete      Studies: Dg Chest 2 View  08/03/2012  *RADIOLOGY REPORT*  Clinical Data: Chest pain  CHEST - 2 VIEW  Comparison: 07/16/2012  Findings: The heart and pulmonary vascularity are within normal limits.  The lungs are clear bilaterally.  No sizable effusion is seen.  No acute bony abnormality is noted.  IMPRESSION: No acute intrathoracic abnormality.   Original Report Authenticated By: Alcide Clever, M.D.    Ct Abdomen Pelvis W Contrast  08/03/2012  *RADIOLOGY REPORT*  Clinical Data: Left inguinal mass.  Right upper quadrant abdominal pain.  Prior history of inguinal hernia repair and hydrocele  repair.  CT ABDOMEN AND PELVIS WITH CONTRAST  Technique:  Multidetector CT imaging of the abdomen and pelvis  was performed following the standard protocol during bolus administration of intravenous contrast.  Contrast: OMNIPAQUE IOHEXOL 300 MG/ML. Oral contrast was also administered.  Comparison: CT abdomen and pelvis 07/20/2011.  Findings: Left inguinal hernia containing fat, though there is marked edema/inflammation within the herniated fat.  Interval repair of the previously identified right inguinal hernia, without evidence of recurrence.  Small to moderate amount of upper abdominal ascites in the perihepatic and perisplenic region and in the gastrohepatic ligament.  Scattered areas of focal hepatic steatosis.  No hepatic masses.  Gallbladder contracted and contains echogenic material consistent with sludge and/or small gallstones, not visualized on the prior examination.  Normal-appearing spleen. Calcifications within the head of the pancreas and in the uncinate region, without evidence of acute pancreatitis.  Normal adrenal glands and kidneys. 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  No significant lymphadenopathy.  Stable small hiatal hernia.  Stomach decompressed and otherwise unremarkable.  Normal-appearing small bowel.  Hyperemia and inflammation involving the proximal sigmoid colon; this inflammation surrounding the sigmoid colon involves the fat within the left inguinal hernia described above.  Small amount of ascites dependently in the pelvis.  Urinary bladder decompressed and unremarkable.  Prior prostatectomy without evidence of recurrent tumor mass in the prostate bed.  Visualized lung bases clear apart from the expected dependent atelectasis posteriorly.  Bone window images demonstrate mild degenerative changes involving the lower thoracic and lumbar spine but no evidence of osseous metastatic disease.  Degenerative changes are present in the sacroiliac joints with ankylosis  on the right.  IMPRESSION:  1.  Left inguinal hernia containing indurated, inflamed fat.  This indurated fat may be secondary to sigmoid diverticulitis. 2.  Small to moderate amount of intra-abdominal and pelvic ascites.  3.  Scattered areas of focal hepatic steatosis. 4.  Sludge and/or small gallstones within the contracted gallbladder. 5.  Chronic calcific pancreatitis. 6.  Stable small hiatal hernia. 7.  Interval right inguinal hernia repair without recurrence. 8.  Prior prostatectomy without evidence of recurrent tumor in the prostate bed.   Original Report Authenticated By: Hulan Saas, M.D.     Scheduled Meds:    . aspirin EC  162 mg Oral Daily  . chlordiazePOXIDE  10 mg Oral TID  . folic acid  1 mg Oral Daily  . [COMPLETED] LORazepam  1 mg Intravenous Once  . metoprolol tartrate  25 mg Oral BID  . multivitamin with minerals  1 tablet Oral Daily  . pantoprazole  40 mg Oral Daily  . piperacillin-tazobactam (ZOSYN)  IV  3.375 g Intravenous Q8H  . sodium chloride  3 mL Intravenous Q12H  . thiamine  100 mg Oral Daily   Or  . thiamine  100 mg Intravenous Daily   Continuous Infusions:    . [EXPIRED] dextrose 5 % and 0.45 % NaCl with KCl 10 mEq/L 100 mL/hr at 08/04/12 0500    Active Problems:  Right inguinal hernia  Breast abscess in male  HTN (hypertension)  Abdominal pain  Alcohol abuse  Smoking history  Left inguinal hernia    Time spent: 30 min.    Steph[employer]  [facility] Pager [phone]. If 8PM-8AM, please contact night-coverage at [email or web address], password [facility] 08/05/2012, 11:30 AM  LOS: 2 days    Attending  Patient seen and examined,agree with above assessment and plan  S Ghimire

## 2012-08-05 NOTE — Evaluation (Signed)
Physical Therapy Evaluation Patient Details Name: Gary [name] MRN: [id number] DOB: September 15, 1946 Today's Date: 08/05/2012 Time: 4098-1191 PT Time Calculation (min): 28 min  PT Assessment / Plan / Recommendation Clinical Impression  Pt adm with delirium.  Pt with ETOH history.  Pt found to have incarcerated lt inguinal hernia.  Pt for probable surgery tomorrow.  Expect mobility will improve as coginition improves.  If cognition improves expect pt will be able to return home with wife.    PT Assessment  Patient needs continued PT services    Follow Up Recommendations  Other (comment) (to be determined)    Does the patient have the potential to tolerate intense rehabilitation      Barriers to Discharge        Equipment Recommendations   (to be determined)    Recommendations for Other Services     Frequency Min 3X/week    Precautions / Restrictions Precautions Precautions: Fall   Pertinent Vitals/Pain N/A      Mobility  Transfers Transfers: Sit to Stand;Stand to Sit Sit to Stand: 3: Mod assist;1: +2 Total assist;With upper extremity assist;With armrests;From bed;From chair/3-in-1 Sit to Stand: Patient Percentage: 50% Stand to Sit: 3: Mod assist;With upper extremity assist;With armrests;To chair/3-in-1 Details for Transfer Assistance: On initial sit to stand from low recliner pt needed 2 person assist but then from higher BSC able to stand with 1 person assist.  Verbal cues for hand placement and technique. Ambulation/Gait Ambulation/Gait Assistance: 3: Mod assist Ambulation Distance (Feet): 3 Feet Assistive device: Rolling walker Ambulation/Gait Assistance Details: Constant verbal/tactile cues to stay closer to walker. Pt pushing walker ahead without advancing feet. Gait Pattern: Step-to pattern;Decreased step length - right;Decreased step length - left;Shuffle;Trunk flexed    Shoulder Instructions     Exercises     PT Diagnosis: Difficulty walking;Generalized  weakness;Abnormality of gait;Altered mental status  PT Problem List: Decreased strength;Decreased balance;Decreased mobility;Decreased knowledge of use of DME;Decreased cognition;Decreased safety awareness;Decreased knowledge of precautions PT Treatment Interventions: DME instruction;Gait training;Stair training;Functional mobility training;Patient/family education;Therapeutic activities;Therapeutic exercise;Balance training   PT Goals Acute Rehab PT Goals PT Goal Formulation: Patient unable to participate in goal setting Time For Goal Achievement: 08/12/12 Potential to Achieve Goals: Good Pt will go Supine/Side to Sit: with modified independence PT Goal: Supine/Side to Sit - Progress: Goal set today Pt will go Sit to Supine/Side: with modified independence PT Goal: Sit to Supine/Side - Progress: Goal set today Pt will go Sit to Stand: with supervision PT Goal: Sit to Stand - Progress: Goal set today Pt will go Stand to Sit: with supervision PT Goal: Stand to Sit - Progress: Goal set today Pt will Ambulate: 51 - 150 feet;with supervision;with least restrictive assistive device PT Goal: Ambulate - Progress: Goal set today Pt will Go Up / Down Stairs: 3-5 stairs;with min assist;with least restrictive assistive device PT Goal: Up/Down Stairs - Progress: Goal set today  Visit Information  Last PT Received On: 08/05/12 Assistance Needed: +2    Subjective Data  Subjective: Pt stated he needed to go to the bathroom. Patient Stated Goal: Return home   Prior Functioning  Home Living Lives With: Spouse Available Help at Discharge: Family Type of Home: House Home Access: Stairs to enter [employer] of Steps: 3-4 Entrance Stairs-Rails: Right Home Layout: Two level;Able to live on main level with bedroom/bathroom Prior Function Level of Independence: Independent Able to Take Stairs?: Yes Driving: Yes Vocation: Retired Comments: worked at [employer] in  [employer] Communication: No difficulties  Cognition  Overall Cognitive Status: Impaired Area of Impairment: Attention;Memory;Safety/judgement;Awareness of errors;Awareness of deficits Arousal/Alertness: Lethargic Behavior During Session: Lethargic Current Attention Level: Focused Safety/Judgement: Decreased awareness of safety precautions;Decreased safety judgement for tasks assessed;Decreased awareness of need for assistance Awareness of Errors: Assistance required to identify errors made;Assistance required to correct errors made    Extremity/Trunk Assessment Right Lower Extremity Assessment RLE ROM/Strength/Tone: Deficits RLE ROM/Strength/Tone Deficits: grossly 4/5 Left Lower Extremity Assessment LLE ROM/Strength/Tone: Deficits LLE ROM/Strength/Tone Deficits: grossly 4/5   Balance Static Standing Balance Static Standing - Balance Support: Bilateral upper extremity supported (on walker) Static Standing - Level of Assistance: 3: Mod assist;4: Min assist (initially mod A but then able to get to min A.)  End of Session PT - End of Session Equipment Utilized During Treatment: Gait belt Activity Tolerance: Patient tolerated treatment well Patient left: in chair;with call bell/phone within reach;with chair alarm set Nurse Communication: Mobility status  GP     Kaiyu Mirabal 08/05/2012, 3:22 PM  [employer] PT [phone]

## 2012-08-05 NOTE — Progress Notes (Signed)
[facility] PROGRESS NOTE  Gary [name] AVW:[id number] DOB: 02/09/1946 DOA: 08/03/2012 PCP: Pamelia Hoit, MD  2:30 pm addendum Called to room by RN.  Patient had recently been very agitated - throwing his food tray to the ground.  CIWA score at 10. When I came into the room patient was sitting in chair appearing lethargic and out of it. He is awaking. Toying with his telemetry box.  Will check ABG and Lactic acid.  Benzodiazepines held.  Assessment/Plan:  1. Delirium  Multifactorial, likely related to EtOH abuse and possible infection (diverticulitis, UTI, URI) Likely related to infection.  Improving with antibiotic therapy and benzodiazepines-but given lethargy today-will need to back off on benzos. Stop Librium. Urine culture negative Blood cultures negative to date.  Final results pending.  2.  Left inguinal hernia  Endurated inflamed Fat. On antibiotic therapy (Zosyn) Plan per Surgery for OR in next 24 to 48 hours. Will receive clear liquid diet.  3.  Chest congestion Repeat chest xray Cough q 4 hours Ambulate On Zosyn and aspiration precautions.  4.  UTI Cultures negative. Will continue on Zosyn given inflamed indurated fat within hernia.  5.  Alcohol Abuse Patient lethargic and disagreeable today.  Monitor closely for DTs. CIWA protocol to prevent DTs. Claims last drink 5 days ago, but doubt reliability of this history  6.  Hx of HTN Normotensive at this point.  On metoprolol 25 mg BID  7.  Sinus Tach Controlled with metoprolol Will continue to monitor on tele bed.   Code Status: full Disposition Plan: Inpatient   Consultants:  General Surgery  Procedures:  None  Antibiotics:  Zosyn and Levaquin started at admission.  Reduced to Zosyn today.  HPI/Subjective: Gary [name] is a 66 y.o. male, who is an alcohol abuser along with one pack a day smoker for the last 60 years, presented to [facility] with confusion, apparently  patient has had a dry cough for the last few days and was tried on CPAP for the last 2 days, however he continued to become gradually more confused over the last 4-5 days, he's not a good historian at this point due to his mild confusion, however he states that he's been having some generalized abdominal pain and he has been having no bowel movements in the last 5-6 days which is unusual for him, he has had a right in 1 hernia surgery a few months ago and he also has left inguinal hernia at this point for which he needs to be operated.   Objective: Filed Vitals:   08/04/12 1710 08/04/12 2114 08/05/12 1036 08/05/12 1304  BP: 148/83 142/86 135/85 111/73  Pulse: 94 91 88 88  Temp:  99.2 F (37.3 C) 98.9 F (37.2 C) 97.5 F (36.4 C)  TempSrc:  Oral Oral Oral  Resp:  20 20 18   Height:      Weight:      SpO2:  92% 95% 95%    Intake/Output Summary (Last 24 hours) at 08/05/12 1615 Last data filed at 08/05/12 1130  Gross per 24 hour  Intake   1450 ml  Output    300 ml  Net   1150 ml   Filed Weights   08/03/12 1111 08/03/12 1700  Weight: 67.132 kg (148 lb) 71.1 kg (156 lb 12 oz)    Exam:   General:  Awake, mildly confused, pleasant  Cardiovascular: RRR no M/R/G  Respiratory: CTA no W/C/R  Abdomen: Firm in upper right quadrant, TTP in  central abdomen. I do not hear bowel sounds.  (but patient reports his bowel movements have improved)  Data Reviewed: Basic Metabolic Panel:  Lab 08/05/12 4782 08/04/12 0650 08/03/12 1110  NA 132* 131* 134*  K 3.5 3.7 3.6  CL 95* 94* 93*  CO2 21 25 26   GLUCOSE 91 103* 132*  BUN 15 16 23   CREATININE 0.85 0.89 1.10  CALCIUM 8.7 8.7 9.3  MG -- -- --  PHOS -- -- --   Liver Function Tests:  Lab 08/03/12 1150  AST 50*  ALT 22  ALKPHOS 96  BILITOT 1.6*  PROT 7.9  ALBUMIN 3.1*    Lab 08/03/12 1215  AMMONIA 25   CBC:  Lab 08/05/12 1013 08/04/12 0650 08/03/12 1110  WBC 10.2 8.6 9.4  NEUTROABS -- -- 7.0  HGB 16.2 15.9 16.8  HCT  44.4 44.7 46.0  MCV 100.7* 102.5* 99.6  PLT 146* 152 152     Recent Results (from the past 240 hour(s))  URINE CULTURE     Status: Normal   Collection Time   08/03/12 11:19 AM      Component Value Range Status Comment   Specimen Description URINE, CLEAN CATCH   Final    Special Requests NONE   Final    Culture  Setup Time 08/04/2012 01:55   Final    Colony Count NO GROWTH   Final    Culture NO GROWTH   Final    Report Status 08/04/2012 FINAL   Final   CULTURE, BLOOD (ROUTINE X 2)     Status: Normal (Preliminary result)   Collection Time   08/03/12  1:00 PM      Component Value Range Status Comment   Specimen Description Blood   Final    Special Requests NONE   Final    Culture  Setup Time 08/03/2012 15:36   Final    Culture     Final    Value:        BLOOD CULTURE RECEIVED NO GROWTH TO DATE CULTURE WILL BE HELD FOR 5 DAYS BEFORE ISSUING A FINAL NEGATIVE REPORT   Report Status PENDING   Incomplete   CULTURE, BLOOD (ROUTINE X 2)     Status: Normal (Preliminary result)   Collection Time   08/03/12  1:05 PM      Component Value Range Status Comment   Specimen Description Blood   Final    Special Requests NONE   Final    Culture  Setup Time 08/03/2012 15:36   Final    Culture     Final    Value:        BLOOD CULTURE RECEIVED NO GROWTH TO DATE CULTURE WILL BE HELD FOR 5 DAYS BEFORE ISSUING A FINAL NEGATIVE REPORT   Report Status PENDING   Incomplete      Studies: Ct Abdomen Pelvis W Contrast  08/03/2012  *RADIOLOGY REPORT*  Clinical Data: Left inguinal mass.  Right upper quadrant abdominal pain.  Prior history of inguinal hernia repair and hydrocele repair.  CT ABDOMEN AND PELVIS WITH CONTRAST  Technique:  Multidetector CT imaging of the abdomen and pelvis was performed following the standard protocol during bolus administration of intravenous contrast.  Contrast: OMNIPAQUE IOHEXOL 300 MG/ML. Oral contrast was also administered.  Comparison: CT abdomen and pelvis 07/20/2011.   Findings: Left inguinal hernia containing fat, though there is marked edema/inflammation within the herniated fat.  Interval repair of the previously identified right inguinal hernia, without evidence of recurrence.  Small to moderate amount of upper abdominal ascites in the perihepatic and perisplenic region and in the gastrohepatic ligament.  Scattered areas of focal hepatic steatosis.  No hepatic masses.  Gallbladder contracted and contains echogenic material consistent with sludge and/or small gallstones, not visualized on the prior examination.  Normal-appearing spleen. Calcifications within the head of the pancreas and in the uncinate region, without evidence of acute pancreatitis.  Normal adrenal glands and kidneys. 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  No significant lymphadenopathy.  Stable small hiatal hernia.  Stomach decompressed and otherwise unremarkable.  Normal-appearing small bowel.  Hyperemia and inflammation involving the proximal sigmoid colon; this inflammation surrounding the sigmoid colon involves the fat within the left inguinal hernia described above.  Small amount of ascites dependently in the pelvis.  Urinary bladder decompressed and unremarkable.  Prior prostatectomy without evidence of recurrent tumor mass in the prostate bed.  Visualized lung bases clear apart from the expected dependent atelectasis posteriorly.  Bone window images demonstrate mild degenerative changes involving the lower thoracic and lumbar spine but no evidence of osseous metastatic disease.  Degenerative changes are present in the sacroiliac joints with ankylosis on the right.  IMPRESSION:  1.  Left inguinal hernia containing indurated, inflamed fat.  This indurated fat may be secondary to sigmoid diverticulitis. 2.  Small to moderate amount of intra-abdominal and pelvic ascites.  3.  Scattered areas of focal hepatic steatosis. 4.  Sludge and/or small gallstones within the contracted  gallbladder. 5.  Chronic calcific pancreatitis. 6.  Stable small hiatal hernia. 7.  Interval right inguinal hernia repair without recurrence. 8.  Prior prostatectomy without evidence of recurrent tumor in the prostate bed.   Original Report Authenticated By: Hulan Saas, M.D.    Dg Chest Port 1 View  08/05/2012  *RADIOLOGY REPORT*  Clinical Data: Cough  PORTABLE CHEST - 1 VIEW  Comparison: 08/03/2012  Findings: Low lung volumes are present with associated subsegmental atelectasis at both lung bases.   Given the overall low lung volumes, heart and mediastinal contours are within normal limits and stable.  The lung fields appear clear with no signs of focal infiltrate or congestive failure.  No pleural fluid or significant peribronchial cuffing is seen.  Bony structures appear intact.  IMPRESSION: Overall lower lung volumes with associated basilar atelectasis.  No new focal infiltrates seen.   Original Report Authenticated By: Rhodia Albright, M.D.     Scheduled Meds:    . aspirin EC  162 mg Oral Daily  . folic acid  1 mg Oral Daily  . metoprolol tartrate  25 mg Oral BID  . multivitamin with minerals  1 tablet Oral Daily  . pantoprazole  40 mg Oral Daily  . piperacillin-tazobactam (ZOSYN)  IV  3.375 g Intravenous Q8H  . sodium chloride  3 mL Intravenous Q12H  . thiamine  100 mg Oral Daily   Or  . thiamine  100 mg Intravenous Daily  . [DISCONTINUED] chlordiazePOXIDE  10 mg Oral TID   Continuous Infusions:    . [EXPIRED] dextrose 5 % and 0.45 % NaCl with KCl 10 mEq/L 100 mL/hr at 08/04/12 0500    Active Problems:  Right inguinal hernia  Breast abscess in male  HTN (hypertension)  Abdominal pain  Alcohol abuse  Smoking history  Left inguinal hernia    Time spent: 30 min.    [facility]  [facility] Pager [phone]. If 8PM-8AM, please contact night-coverage at [email or web address], password [facility] 08/05/2012, 4:15 PM  LOS: 2 days  Attending Patient seen and  examined, was lethargic this am, but more awake by this afternoon. Will need to back off on Benzos.-stop Librium. [facility] planning OR in the next few days. Continue with current care.Agree with the assessment and plan as outlined above.  S Ghimire

## 2012-08-06 ENCOUNTER — Encounter (HOSPITAL_COMMUNITY)
Admission: EM | Disposition: A | Discharge status: Home / Self Care | Payer: Self-pay | Source: Home / Self Care | Attending: Internal Medicine

## 2012-08-06 LAB — BASIC METABOLIC PANEL
BUN: 21 mg/dL (ref 6–23)
Calcium: 8.6 mg/dL (ref 8.4–10.5)
GFR calc Af Amer: >90 mL/min (ref >90)
GFR calc non Af Amer: 87 mL/min — ABNORMAL LOW (ref >90)
Glucose, Bld: 106 mg/dL — ABNORMAL HIGH (ref 70–99)
Sodium: 134 mEq/L — ABNORMAL LOW (ref 135–145)

## 2012-08-06 LAB — GLUCOSE, CAPILLARY
Glucose-Capillary: 148 mg/dL — ABNORMAL HIGH (ref 70–99)
Glucose-Capillary: 244 mg/dL — ABNORMAL HIGH (ref 70–99)

## 2012-08-06 LAB — CBC
MCH: 36.5 pg — ABNORMAL HIGH (ref 26.0–34.0)
MCHC: 36.6 g/dL — ABNORMAL HIGH (ref 30.0–36.0)
Platelets: 181 10*3/uL (ref 150–400)

## 2012-08-06 SURGERY — REPAIR, HERNIA, INGUINAL, LAPAROSCOPIC
Anesthesia: General | Laterality: Left

## 2012-08-06 MED ORDER — ACETAMINOPHEN 325 MG PO TABS: 650.0000 mg | ORAL_TABLET | Freq: Four times a day (QID) | ORAL | Status: DC | PRN

## 2012-08-06 MED ORDER — ENSURE COMPLETE PO LIQD
237.0000 mL | Freq: Two times a day (BID) | ORAL | Status: DC
  Administered 2012-08-06 – 2012-08-07 (×3): 237 mL via ORAL

## 2012-08-06 MED ORDER — POTASSIUM CHLORIDE 20 MEQ/15ML (10%) PO LIQD
40.0000 meq | Freq: Every day | ORAL | Status: DC
  Administered 2012-08-06 – 2012-08-07 (×2): 40 meq via ORAL
  Filled 2012-08-06 (×2): qty 30

## 2012-08-06 MED ORDER — NICOTINE 21 MG/24HR TD PT24
21.0000 mg | MEDICATED_PATCH | Freq: Every day | TRANSDERMAL | Status: DC
  Administered 2012-08-06 – 2012-08-07 (×2): 21 mg via TRANSDERMAL
  Filled 2012-08-06 (×2): qty 1

## 2012-08-06 NOTE — Care Management Note (Unsigned)
    Page 1 of 1   08/06/2012     4:58:04 PM   CARE MANAGEMENT NOTE 08/06/2012  Patient:  Gary [name], Gary [name]   Account Number:  [id number]  Date Initiated:  08/06/2012  Documentation initiated by:  Letha Cape  Subjective/Objective Assessment:   dx right inguinal hernia  admit- lives with spouse.     Action/Plan:   Anticipated DC Date:  08/07/2012   Anticipated DC Plan:  HOME/SELF CARE      DC Planning Services  CM consult      Choice offered to / List presented to:             Status of service:  In process, will continue to follow Medicare Important Message given?   (If response is "NO", the following Medicare IM given date fields will be blank) Date Medicare IM given:   Date Additional Medicare IM given:    Discharge Disposition:    Per UR Regulation:  Reviewed for med. necessity/level of care/duration of stay  If discussed at Long Length of Stay Meetings, dates discussed:    Comments:  08/06/12 16:57 Letha Cape RN, BSN [phone] patient lives with spouse, NCM will continue to follow for dc needs.

## 2012-08-06 NOTE — Progress Notes (Signed)
Patient will benefit from surgery at some point to address the hernia.  I am skeptical this is a source of his issues this admission.  Would be best to optimize his health issues and infection issues before putting him through an operation, especially with the placement of mesh to help decrease recurrence.  Safest thing may be to let him recover from this admission and then discussed with his surgeon, Dr. Violeta Gelinas, for left repaired later time.  If markedly declines, consider diagnostic laparoscopy vs. Open wound exploration to address the area

## 2012-08-06 NOTE — Progress Notes (Signed)
I have seen and examined the patient and agree with the assessment and plans.  Will see as an outpatient.  Daryn Hicks A. Magnus Ivan  MD, FACS

## 2012-08-06 NOTE — Progress Notes (Signed)
Patient ID: Gary [name], male   DOB: 08/30/1946, 66 y.o.   MRN: [id number]    Subjective: Pt feels ok.  No nausea.  Ate clears and tolerated these well.  Getting ready to try regular diet.  + BM.  Objective: Vital signs in last 24 hours: Temp:  [97.5 F (36.4 C)-98.9 F (37.2 C)] 98.6 F (37 C) (11/08 0527) Pulse Rate:  [80-97] 80  (11/08 0527) Resp:  [18-20] 18  (11/08 0527) BP: (111-135)/(71-85) 114/71 mmHg (11/08 0527) SpO2:  [92 %-95 %] 92 % (11/08 0527) Weight:  [156 lb 15.5 oz (71.2 kg)] 156 lb 15.5 oz (71.2 kg) (11/08 0007) Last BM Date: 08/05/12  Intake/Output from previous day: 11/07 0701 - 11/08 0700 In: 308 [P.O.:208; IV Piggyback:100] Out: 450 [Urine:450] Intake/Output this shift:    PE: Abd: soft, mild distention, +BS, NT, hernia is reduced. Heart: regular Lungs: CTAB  Lab Results:   Basename 08/06/12 0515 08/05/12 1013  WBC 13.9* 10.2  HGB 15.6 16.2  HCT 42.6 44.4  PLT 181 146*   BMET  Basename 08/06/12 0515 08/05/12 1013  NA 134* 132*  K 3.2* 3.5  CL 98 95*  CO2 21 21  GLUCOSE 106* 91  BUN 21 15  CREATININE 0.88 0.85  CALCIUM 8.6 8.7   PT/INR  Basename 08/03/12 1811  LABPROT 15.0  INR 1.20   CMP     Component Value Date/Time   NA 134* 08/06/2012 0515   K 3.2* 08/06/2012 0515   CL 98 08/06/2012 0515   CO2 21 08/06/2012 0515   GLUCOSE 106* 08/06/2012 0515   BUN 21 08/06/2012 0515   CREATININE 0.88 08/06/2012 0515   CALCIUM 8.6 08/06/2012 0515   PROT 7.9 08/03/2012 1150   ALBUMIN 3.1* 08/03/2012 1150   AST 50* 08/03/2012 1150   ALT 22 08/03/2012 1150   ALKPHOS 96 08/03/2012 1150   BILITOT 1.6* 08/03/2012 1150   GFRNONAA 87* 08/06/2012 0515   GFRAA >90 08/06/2012 0515   Lipase     Component Value Date/Time   LIPASE 27 07/20/2011 0915       Studies/Results: Dg Chest Port 1 View  08/05/2012  *RADIOLOGY REPORT*  Clinical Data: Cough  PORTABLE CHEST - 1 VIEW  Comparison: 08/03/2012  Findings: Low lung volumes are present with associated  subsegmental atelectasis at both lung bases.   Given the overall low lung volumes, heart and mediastinal contours are within normal limits and stable.  The lung fields appear clear with no signs of focal infiltrate or congestive failure.  No pleural fluid or significant peribronchial cuffing is seen.  Bony structures appear intact.  IMPRESSION: Overall lower lung volumes with associated basilar atelectasis.  No new focal infiltrates seen.   Original Report Authenticated By: Rhodia Albright, M.D.     Anti-infectives: Anti-infectives     Start     Dose/Rate Route Frequency Ordered Stop   08/03/12 2200  piperacillin-tazobactam (ZOSYN) IVPB 3.375 g       3.375 g 12.5 mL/hr over 240 Minutes Intravenous 3 times per day 08/03/12 2014     08/03/12 2000   levofloxacin (LEVAQUIN) IVPB 500 mg  Status:  Discontinued        500 mg 100 mL/hr over 60 Minutes Intravenous Every 24 hours 08/03/12 1810 08/04/12 0913   08/03/12 1900   piperacillin-tazobactam (ZOSYN) IVPB 3.375 g  Status:  Discontinued        3.375 g 12.5 mL/hr over 240 Minutes Intravenous Every 8 hours 08/03/12 1814 08/03/12  2014   08/03/12 1800   levofloxacin (LEVAQUIN) IVPB 500 mg  Status:  Discontinued        500 mg 100 mL/hr over 60 Minutes Intravenous Every 24 hours 08/03/12 1756 08/03/12 1801   08/03/12 1315   cefTRIAXone (ROCEPHIN) 1 g in dextrose 5 % 50 mL IVPB        1 g 100 mL/hr over 30 Minutes Intravenous  Once 08/03/12 1302 08/03/12 1420           Assessment/Plan  1. Bilateral inguinal hernia, reduced 2. ETOH withdrawal  Plan: 1.  Patient is tolerating his diet so far.  His abdominal pain is much improved.  He would like to come back and get his hernia fixed as an outpatient.  As long as he tolerates his diet, he is stable for dc home from our standpoint. 2. Please call us back if needed.  LOS: 3 days    Gary [name] 08/06/2012, 10:33 AM Pager: [phone]

## 2012-08-06 NOTE — Progress Notes (Signed)
ANTIBIOTIC CONSULT NOTE - FOLLOW UP  Pharmacy Consult:  Zosyn Indication:  Empiric therapy  No Known Allergies  Patient Measurements: Height: 5\' 8"  (172.7 cm) Weight: 156 lb 15.5 oz (71.2 kg) IBW/kg (Calculated) : 68.4   Vital Signs: Temp: 98.6 F (37 C) (11/08 0527) Temp src: Oral (11/08 0527) BP: 114/71 mmHg (11/08 0527) Pulse Rate: 80  (11/08 0527) Intake/Output from previous day: 11/07 0701 - 11/08 0700 In: 308 [P.O.:208; IV Piggyback:100] Out: 450 [Urine:450]  Labs:  [facility] 08/06/12 0515 08/05/12 1013 08/04/12 0650  WBC 13.9* 10.2 8.6  HGB 15.6 16.2 15.9  PLT 181 146* 152  LABCREA -- -- --  CREATININE 0.88 0.85 0.89   Estimated Creatinine Clearance: 78.8 ml/min (by C-G formula based on Cr of 0.88). No results found for this basename: VANCOTROUGH:2,VANCOPEAK:2,VANCORANDOM:2,GENTTROUGH:2,GENTPEAK:2,GENTRANDOM:2,TOBRATROUGH:2,TOBRAPEAK:2,TOBRARND:2,AMIKACINPEAK:2,AMIKACINTROU:2,AMIKACIN:2, in the last 72 hours   Microbiology: Recent Results (from the past 720 hour(s))  URINE CULTURE     Status: Normal   Collection Time   08/03/12 11:19 AM      Component Value Range Status Comment   Specimen Description URINE, CLEAN CATCH   Final    Special Requests NONE   Final    Culture  Setup Time 08/04/2012 01:55   Final    Colony Count NO GROWTH   Final    Culture NO GROWTH   Final    Report Status 08/04/2012 FINAL   Final   CULTURE, BLOOD (ROUTINE X 2)     Status: Normal (Preliminary result)   Collection Time   08/03/12  1:00 PM      Component Value Range Status Comment   Specimen Description Blood   Final    Special Requests NONE   Final    Culture  Setup Time 08/03/2012 15:36   Final    Culture     Final    Value:        BLOOD CULTURE RECEIVED NO GROWTH TO DATE CULTURE WILL BE HELD FOR 5 DAYS BEFORE ISSUING A FINAL NEGATIVE REPORT   Report Status PENDING   Incomplete   CULTURE, BLOOD (ROUTINE X 2)     Status: Normal (Preliminary result)   Collection Time   08/03/12   1:05 PM      Component Value Range Status Comment   Specimen Description Blood   Final    Special Requests NONE   Final    Culture  Setup Time 08/03/2012 15:36   Final    Culture     Final    Value:        BLOOD CULTURE RECEIVED NO GROWTH TO DATE CULTURE WILL BE HELD FOR 5 DAYS BEFORE ISSUING A FINAL NEGATIVE REPORT   Report Status PENDING   Incomplete         Assessment: 25 YOM admitted with confusion/delerium, generalized abdominal pain, and dry cough.  He also has left inguinal hernia and Zosyn to continue as empiric therapy for endurated inflamed fat while awaiting surgery.  Negative for UTI per culture.  Patient's renal function has been stable.  Zosyn 11/5>> Levaquin 11/5>>11/6  11/5 Blood >> NGTD 11/5 Urine >> negative   Goal of Therapy:  Clearance of infection / infection prevention   Plan:  - Continue Zosyn 3.375g IV q8h, 4 hr infusion - Monitor renal function, clinical course - F/U surgical plan and abx LOT - F/U KCL supplementation     Jolynne Spurgin D. Laney Potash, PharmD, BCPS Pager:  [phone] 08/06/2012, 8:29 AM

## 2012-08-06 NOTE — Anesthesia Preprocedure Evaluation (Deleted)
Anesthesia Evaluation    Airway       Dental   Pulmonary   IMPRESSION: Overall lower lung volumes with associated basilar atelectasis.  No new focal infiltrates seen.  08-05-12         Cardiovascular  03-Aug-2012 12:19:14 [facility] ROUTINE RECORD Sinus tachycardia Anterior infarct , age undetermined   Neuro/Psych    GI/Hepatic   Endo/Other    Renal/GU      Musculoskeletal   Abdominal   Peds  Hematology   Anesthesia Other Findings   Reproductive/Obstetrics                          Anesthesia Physical Anesthesia Plan Anesthesia Quick Evaluation

## 2012-08-06 NOTE — Preoperative (Signed)
Beta Blockers   Reason not to administer Beta Blockers:Not Applicable 

## 2012-08-06 NOTE — Progress Notes (Signed)
[facility] PROGRESS NOTE  Gary [name] ZOX:[id number] DOB: Aug 30, 1946 DOA: 08/03/2012 PCP: Pamelia Hoit, MD   Assessment/Plan:  1. Delirium  Slow improvement Multifactorial, likely related to EtOH abuse and possible infection (diverticulitis, UTI, URI) Likely related to infection.  Improving with antibiotic therapy.  Benzodiazepines discontinued. Narcotics discontinued  2.  Left inguinal hernia  Endurated inflamed Fat. On antibiotic therapy (Zosyn) Plan per Surgery for elective surgery.  Outpatient follow up Will receive regular diet.  3.  Chest congestion Improved Lungs sound clear.  CXR negative for infection. Cough q 4 hours Ambulate On Zosyn and aspiration precautions.  4.  UTI Cultures negative. Will continue on Zosyn given inflamed indurated fat within hernia.  5.  Alcohol Abuse Claims last drink last Saturday (spouse thinks it was [place]) but doubt reliability of this history Will ask social work to offer resources.  6.  Hx of HTN Normotensive at this point.  On metoprolol 25 mg BID  7.  Sinus Tach Improved. Controlled with metoprolol Will continue to monitor on tele bed.   Code Status: full Disposition Plan: Inpatient.  Hopeful for d/c 11/9   Consultants:  General Surgery  Procedures:  None  Antibiotics:  Zosyn and Levaquin started at admission.  Now on zosyn  HPI/Subjective: Gary [name] is a 66 y.o. male, who is an alcohol abuser along with one pack a day smoker for the last 60 years, presented to [facility] with confusion, apparently patient has had a dry cough for the last few days and was tried on CPAP for the last 2 days, however he continued to become gradually more confused over the last 4-5 days, he's not a good historian at this point due to his mild confusion, however he states that he's been having some generalized abdominal pain and he has been having no bowel movements in the last 5-6 days which is  unusual for him, he has had a right in 1 hernia surgery a few months ago and he also has left inguinal hernia at this point for which he needs to be operated.   Objective: Filed Vitals:   08/05/12 1304 08/05/12 2100 08/06/12 0007 08/06/12 0527  BP: 111/73 124/83  114/71  Pulse: 88 97  80  Temp: 97.5 F (36.4 C) 97.5 F (36.4 C)  98.6 F (37 C)  TempSrc: Oral Oral  Oral  Resp: 18 20  18   Height:      Weight:   71.2 kg (156 lb 15.5 oz)   SpO2: 95% 93%  92%    Intake/Output Summary (Last 24 hours) at 08/06/12 1107 Last data filed at 08/06/12 0514  Gross per 24 hour  Intake    218 ml  Output    350 ml  Net   -132 ml   Filed Weights   08/03/12 1111 08/03/12 1700 08/06/12 0007  Weight: 67.132 kg (148 lb) 71.1 kg (156 lb 12 oz) 71.2 kg (156 lb 15.5 oz)    Exam:   General:  Awake, mildly confused, pleasant  Cardiovascular: RRR no M/R/G  Respiratory: CTA no W/C/R  Abdomen: Firm in upper right quadrant, TTP in central abdomen. I do not hear bowel sounds.  (but patient reports his bowel movements have improved)  Data Reviewed: Basic Metabolic Panel:  Lab 08/06/12 8119 08/05/12 1013 08/04/12 0650 08/03/12 1110  NA 134* 132* 131* 134*  K 3.2* 3.5 3.7 3.6  CL 98 95* 94* 93*  CO2 21 21 25 26   GLUCOSE 106* 91 103*  132*  BUN 21 15 16 23   CREATININE 0.88 0.85 0.89 1.10  CALCIUM 8.6 8.7 8.7 9.3  MG -- -- -- --  PHOS -- -- -- --   Liver Function Tests:  Lab 08/03/12 1150  AST 50*  ALT 22  ALKPHOS 96  BILITOT 1.6*  PROT 7.9  ALBUMIN 3.1*    Lab 08/03/12 1215  AMMONIA 25   CBC:  Lab 08/06/12 0515 08/05/12 1013 08/04/12 0650 08/03/12 1110  WBC 13.9* 10.2 8.6 9.4  NEUTROABS -- -- -- 7.0  HGB 15.6 16.2 15.9 16.8  HCT 42.6 44.4 44.7 46.0  MCV 99.8 100.7* 102.5* 99.6  PLT 181 146* 152 152     Recent Results (from the past 240 hour(s))  URINE CULTURE     Status: Normal   Collection Time   08/03/12 11:19 AM      Component Value Range Status Comment   Specimen  Description URINE, CLEAN CATCH   Final    Special Requests NONE   Final    Culture  Setup Time 08/04/2012 01:55   Final    Colony Count NO GROWTH   Final    Culture NO GROWTH   Final    Report Status 08/04/2012 FINAL   Final   CULTURE, BLOOD (ROUTINE X 2)     Status: Normal (Preliminary result)   Collection Time   08/03/12  1:00 PM      Component Value Range Status Comment   Specimen Description Blood   Final    Special Requests NONE   Final    Culture  Setup Time 08/03/2012 15:36   Final    Culture     Final    Value:        BLOOD CULTURE RECEIVED NO GROWTH TO DATE CULTURE WILL BE HELD FOR 5 DAYS BEFORE ISSUING A FINAL NEGATIVE REPORT   Report Status PENDING   Incomplete   CULTURE, BLOOD (ROUTINE X 2)     Status: Normal (Preliminary result)   Collection Time   08/03/12  1:05 PM      Component Value Range Status Comment   Specimen Description Blood   Final    Special Requests NONE   Final    Culture  Setup Time 08/03/2012 15:36   Final    Culture     Final    Value:        BLOOD CULTURE RECEIVED NO GROWTH TO DATE CULTURE WILL BE HELD FOR 5 DAYS BEFORE ISSUING A FINAL NEGATIVE REPORT   Report Status PENDING   Incomplete      Studies: Dg Chest Port 1 View  08/05/2012  *RADIOLOGY REPORT*  Clinical Data: Cough  PORTABLE CHEST - 1 VIEW  Comparison: 08/03/2012  Findings: Low lung volumes are present with associated subsegmental atelectasis at both lung bases.   Given the overall low lung volumes, heart and mediastinal contours are within normal limits and stable.  The lung fields appear clear with no signs of focal infiltrate or congestive failure.  No pleural fluid or significant peribronchial cuffing is seen.  Bony structures appear intact.  IMPRESSION: Overall lower lung volumes with associated basilar atelectasis.  No new focal infiltrates seen.   Original Report Authenticated By: Rhodia Albright, M.D.     Scheduled Meds:    . aspirin EC  162 mg Oral Daily  . feeding supplement  237  mL Oral BID BM  . folic acid  1 mg Oral Daily  . metoprolol tartrate  25  mg Oral BID  . multivitamin with minerals  1 tablet Oral Daily  . pantoprazole  40 mg Oral Daily  . piperacillin-tazobactam (ZOSYN)  IV  3.375 g Intravenous Q8H  . potassium chloride  40 mEq Oral Daily  . sodium chloride  3 mL Intravenous Q12H  . thiamine  100 mg Oral Daily   Or  . thiamine  100 mg Intravenous Daily  . [DISCONTINUED] chlordiazePOXIDE  10 mg Oral TID   Continuous Infusions:    Active Problems:  Right inguinal hernia  Breast abscess in male  HTN (hypertension)  Abdominal pain  Alcohol abuse  Smoking history  Left inguinal hernia    Time spent: 30 min.    Stephani Police  [facility] Pager [phone]. If 8PM-8AM, please contact night-coverage at [email or web address], password [facility] 08/06/2012, 11:07 AM  LOS: 3 days    Attending -Patient seen and examined.Agree with assessment and plan. He is much more awake, mentation is clear. He is slowly improving. [facility]-will see this patient as outpatient. Continue with empiric Zosyn till D/C. If patient continues to do well-plan to d/c 11/9.  S Ghimire

## 2012-08-07 MED ORDER — NICOTINE 21 MG/24HR TD PT24: 1.0000 | MEDICATED_PATCH | Freq: Every day | TRANSDERMAL | Status: DC

## 2012-08-07 MED ORDER — GUAIFENESIN-DM 100-10 MG/5ML PO SYRP: 5.0000 mL | ORAL_SOLUTION | ORAL | Status: AC | PRN

## 2012-08-07 MED ORDER — ENSURE COMPLETE PO LIQD: 237.0000 mL | Freq: Two times a day (BID) | ORAL | Status: AC

## 2012-08-07 MED ORDER — CIPROFLOXACIN HCL 500 MG PO TABS: 500.0000 mg | ORAL_TABLET | Freq: Two times a day (BID) | ORAL | Status: AC

## 2012-08-07 MED ORDER — THIAMINE HCL 100 MG PO TABS: 100.0000 mg | ORAL_TABLET | Freq: Every day | ORAL | Status: AC

## 2012-08-07 MED ORDER — FOLIC ACID 1 MG PO TABS: 1.0000 mg | ORAL_TABLET | Freq: Every day | ORAL | Status: AC

## 2012-08-07 MED ORDER — METRONIDAZOLE 500 MG PO TABS: 500.0000 mg | ORAL_TABLET | Freq: Three times a day (TID) | ORAL | Status: DC

## 2012-08-07 MED ORDER — ADULT MULTIVITAMIN W/MINERALS CH: 1.0000 | ORAL_TABLET | Freq: Every day | ORAL | Status: AC

## 2012-08-07 NOTE — Progress Notes (Signed)
08/07/12 Patient to discharged home today with wife.IV site removed. Discharge instructions reviewed with patient and wife.

## 2012-08-07 NOTE — Progress Notes (Addendum)
NCM spoke to pt and states he does need RW for home. Offered pt choice for [facility] and states he has no preference. Reviewed agencies that accept his insurance. Pt requested [facility].  Faxed referral to [facility] for [facility]. Notified [facility] of HH and DME needed. Isidoro Donning RN CCM Case Mgmt phone [phone]

## 2012-08-07 NOTE — Progress Notes (Signed)
PATIENT DETAILS Name: Gary [name] Age: 66 y.o. Sex: male Date of Birth: 1946/07/24 Admit Date: 08/03/2012 Admitting Physician Gary Sea, MD ZOX:[id number] Gary Cooter, MD  Subjective: Confusion slowly improving every day-eating and ambulating  Assessment/Plan: Active Problems:  Delirium  -slow improvement continues-wife at bedside-claims his mental status is better than the last few days, and worse in the morning but improves as the day goes along -suspect lingering ETOH withdrawal, but clearly significantly better than the last few days -spouse wants to get patient home, "best place for him". Patient also very anxious to go home.  Left inguinal hernia  Endurated inflamed Fat. -on Zosyn-transition to cipro and flagyl-outpatient [facility] follow up  ETOH abuse -as above -off all benzos (stopped after lethargy)  Disposition: Home today-at patient and family request  Code Status: Full code   Procedures:  None  CONSULTS:  None  PHYSICAL EXAM: Vital signs in last 24 hours: Filed Vitals:   08/06/12 1555 08/06/12 2056 08/07/12 0440 08/07/12 0800  BP: 96/66 122/79 104/77   Pulse: 100 99 98 69  Temp: 98.9 F (37.2 C) 97.3 F (36.3 C) 98.1 F (36.7 C)   TempSrc: Axillary Oral Oral   Resp: 18 16 20    Height:      Weight:   75.2 kg (165 lb 12.6 oz)   SpO2: 94% 92% 92%     Weight change: 4 kg (8 lb 13.1 oz) Body mass index is 25.21 kg/(m^2).   Gen Exam: Awake and alert with clear speech.  Neck: Supple, No JVD.   Chest: B/L Clear.   CVS: S1 S2 Regular, no murmurs.  Abdomen: soft, BS +, non tender, non distended.  Extremities: no edema, lower extremities warm to touch. Neurologic: Non Focal.   Skin: No Rash.   Wounds: N/A.   Intake/Output from previous day:  Intake/Output Summary (Last 24 hours) at 08/07/12 1331 Last data filed at 08/07/12 0846  Gross per 24 hour  Intake    340 ml  Output      0 ml  Net    340 ml     LAB RESULTS: CBC  Lab 08/06/12  0515 08/05/12 1013 08/04/12 0650 08/03/12 1110  WBC 13.9* 10.2 8.6 9.4  HGB 15.6 16.2 15.9 16.8  HCT 42.6 44.4 44.7 46.0  PLT 181 146* 152 152  MCV 99.8 100.7* 102.5* 99.6  MCH 36.5* 36.7* 36.5* 36.4*  MCHC 36.6* 36.5* 35.6 36.5*  RDW 14.1 14.1 14.2 13.6  LYMPHSABS -- -- -- 1.0  MONOABS -- -- -- 1.2*  EOSABS -- -- -- 0.2  BASOSABS -- -- -- 0.0  BANDABS -- -- -- --    Chemistries   Lab 08/06/12 0515 08/05/12 1013 08/04/12 0650 08/03/12 1110  NA 134* 132* 131* 134*  K 3.2* 3.5 3.7 3.6  CL 98 95* 94* 93*  CO2 21 21 25 26   GLUCOSE 106* 91 103* 132*  BUN 21 15 16 23   CREATININE 0.88 0.85 0.89 1.10  CALCIUM 8.6 8.7 8.7 9.3  MG -- -- -- --    CBG:  Lab 08/06/12 1745 08/06/12 1152 08/06/12 0753  GLUCAP 244* 257* 148*    GFR Estimated Creatinine Clearance: 78.8 ml/min (by C-G formula based on Cr of 0.88).  Coagulation profile  Lab 08/03/12 1811  INR 1.20  PROTIME --    Cardiac Enzymes No results found for this basename: CK:3,CKMB:3,TROPONINI:3,MYOGLOBIN:3 in the last 168 hours  No components found with this basename: POCBNP:3 No results found for this basename:  DDIMER:2 in the last 72 hours No results found for this basename: HGBA1C:2 in the last 72 hours No results found for this basename: CHOL:2,HDL:2,LDLCALC:2,TRIG:2,CHOLHDL:2,LDLDIRECT:2 in the last 72 hours No results found for this basename: TSH,T4TOTAL,FREET3,T3FREE,THYROIDAB in the last 72 hours No results found for this basename: VITAMINB12:2,FOLATE:2,FERRITIN:2,TIBC:2,IRON:2,RETICCTPCT:2 in the last 72 hours No results found for this basename: LIPASE:2,AMYLASE:2 in the last 72 hours  Urine Studies No results found for this basename: UACOL:2,UAPR:2,USPG:2,UPH:2,UTP:2,UGL:2,UKET:2,UBIL:2,UHGB:2,UNIT:2,UROB:2,ULEU:2,UEPI:2,UWBC:2,URBC:2,UBAC:2,CAST:2,CRYS:2,UCOM:2,BILUA:2 in the last 72 hours  MICROBIOLOGY: Recent Results (from the past 240 hour(s))  URINE CULTURE     Status: Normal   Collection Time    08/03/12 11:19 AM      Component Value Range Status Comment   Specimen Description URINE, CLEAN CATCH   Final    Special Requests NONE   Final    Culture  Setup Time 08/04/2012 01:55   Final    Colony Count NO GROWTH   Final    Culture NO GROWTH   Final    Report Status 08/04/2012 FINAL   Final   CULTURE, BLOOD (ROUTINE X 2)     Status: Normal (Preliminary result)   Collection Time   08/03/12  1:00 PM      Component Value Range Status Comment   Specimen Description Blood   Final    Special Requests NONE   Final    Culture  Setup Time 08/03/2012 15:36   Final    Culture     Final    Value:        BLOOD CULTURE RECEIVED NO GROWTH TO DATE CULTURE WILL BE HELD FOR 5 DAYS BEFORE ISSUING A FINAL NEGATIVE REPORT   Report Status PENDING   Incomplete   CULTURE, BLOOD (ROUTINE X 2)     Status: Normal (Preliminary result)   Collection Time   08/03/12  1:05 PM      Component Value Range Status Comment   Specimen Description Blood   Final    Special Requests NONE   Final    Culture  Setup Time 08/03/2012 15:36   Final    Culture     Final    Value:        BLOOD CULTURE RECEIVED NO GROWTH TO DATE CULTURE WILL BE HELD FOR 5 DAYS BEFORE ISSUING A FINAL NEGATIVE REPORT   Report Status PENDING   Incomplete     RADIOLOGY STUDIES/RESULTS: Dg Chest 2 View  08/03/2012  *RADIOLOGY REPORT*  Clinical Data: Chest pain  CHEST - 2 VIEW  Comparison: 07/16/2012  Findings: The heart and pulmonary vascularity are within normal limits.  The lungs are clear bilaterally.  No sizable effusion is seen.  No acute bony abnormality is noted.  IMPRESSION: No acute intrathoracic abnormality.   Original Report Authenticated By: Gary [name], M.D.    Ct Abdomen Pelvis W Contrast  08/03/2012  *RADIOLOGY REPORT*  Clinical Data: Left inguinal mass.  Right upper quadrant abdominal pain.  Prior history of inguinal hernia repair and hydrocele repair.  CT ABDOMEN AND PELVIS WITH CONTRAST  Technique:  Multidetector CT imaging of the  abdomen and pelvis was performed following the standard protocol during bolus administration of intravenous contrast.  Contrast: OMNIPAQUE IOHEXOL 300 MG/ML. Oral contrast was also administered.  Comparison: CT abdomen and pelvis 07/20/2011.  Findings: Left inguinal hernia containing fat, though there is marked edema/inflammation within the herniated fat.  Interval repair of the previously identified right inguinal hernia, without evidence of recurrence.  Small to moderate amount of upper abdominal ascites  in the perihepatic and perisplenic region and in the gastrohepatic ligament.  Scattered areas of focal hepatic steatosis.  No hepatic masses.  Gallbladder contracted and contains echogenic material consistent with sludge and/or small gallstones, not visualized on the prior examination.  Normal-appearing spleen. Calcifications within the head of the pancreas and in the uncinate region, without evidence of acute pancreatitis.  Normal adrenal glands and kidneys. 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  No significant lymphadenopathy.  Stable small hiatal hernia.  Stomach decompressed and otherwise unremarkable.  Normal-appearing small bowel.  Hyperemia and inflammation involving the proximal sigmoid colon; this inflammation surrounding the sigmoid colon involves the fat within the left inguinal hernia described above.  Small amount of ascites dependently in the pelvis.  Urinary bladder decompressed and unremarkable.  Prior prostatectomy without evidence of recurrent tumor mass in the prostate bed.  Visualized lung bases clear apart from the expected dependent atelectasis posteriorly.  Bone window images demonstrate mild degenerative changes involving the lower thoracic and lumbar spine but no evidence of osseous metastatic disease.  Degenerative changes are present in the sacroiliac joints with ankylosis on the right.  IMPRESSION:  1.  Left inguinal hernia containing indurated, inflamed fat.   This indurated fat may be secondary to sigmoid diverticulitis. 2.  Small to moderate amount of intra-abdominal and pelvic ascites.  3.  Scattered areas of focal hepatic steatosis. 4.  Sludge and/or small gallstones within the contracted gallbladder. 5.  Chronic calcific pancreatitis. 6.  Stable small hiatal hernia. 7.  Interval right inguinal hernia repair without recurrence. 8.  Prior prostatectomy without evidence of recurrent tumor in the prostate bed.   Original Report Authenticated By: Hulan Saas, M.D.    Dg Chest Port 1 View  08/05/2012  *RADIOLOGY REPORT*  Clinical Data: Cough  PORTABLE CHEST - 1 VIEW  Comparison: 08/03/2012  Findings: Low lung volumes are present with associated subsegmental atelectasis at both lung bases.   Given the overall low lung volumes, heart and mediastinal contours are within normal limits and stable.  The lung fields appear clear with no signs of focal infiltrate or congestive failure.  No pleural fluid or significant peribronchial cuffing is seen.  Bony structures appear intact.  IMPRESSION: Overall lower lung volumes with associated basilar atelectasis.  No new focal infiltrates seen.   Original Report Authenticated By: Rhodia Albright, M.D.     MEDICATIONS: Scheduled Meds:   . aspirin EC  162 mg Oral Daily  . feeding supplement  237 mL Oral BID BM  . folic acid  1 mg Oral Daily  . metoprolol tartrate  25 mg Oral BID  . multivitamin with minerals  1 tablet Oral Daily  . nicotine  21 mg Transdermal Daily  . pantoprazole  40 mg Oral Daily  . piperacillin-tazobactam (ZOSYN)  IV  3.375 g Intravenous Q8H  . potassium chloride  40 mEq Oral Daily  . sodium chloride  3 mL Intravenous Q12H  . thiamine  100 mg Oral Daily   Or  . thiamine  100 mg Intravenous Daily   Continuous Infusions:  PRN Meds:.acetaminophen, albuterol, guaiFENesin-dextromethorphan, ondansetron (ZOFRAN) IV, ondansetron  Antibiotics: Anti-infectives     Start     Dose/Rate Route Frequency  Ordered Stop   08/03/12 2200  piperacillin-tazobactam (ZOSYN) IVPB 3.375 g       3.375 g 12.5 mL/hr over 240 Minutes Intravenous 3 times per day 08/03/12 2014     08/03/12 2000   levofloxacin (LEVAQUIN) IVPB 500 mg  Status:  Discontinued  500 mg 100 mL/hr over 60 Minutes Intravenous Every 24 hours 08/03/12 1810 08/04/12 0913   08/03/12 1900   piperacillin-tazobactam (ZOSYN) IVPB 3.375 g  Status:  Discontinued        3.375 g 12.5 mL/hr over 240 Minutes Intravenous Every 8 hours 08/03/12 1814 08/03/12 2014   08/03/12 1800   levofloxacin (LEVAQUIN) IVPB 500 mg  Status:  Discontinued        500 mg 100 mL/hr over 60 Minutes Intravenous Every 24 hours 08/03/12 1756 08/03/12 1801   08/03/12 1315   cefTRIAXone (ROCEPHIN) 1 g in dextrose 5 % 50 mL IVPB        1 g 100 mL/hr over 30 Minutes Intravenous  Once 08/03/12 1302 08/03/12 1420           GHIMIRE,SHANKER, MD  [facility] Pager:[phone]  If 7PM-7AM, please contact night-coverage [email or web address] Password [email or web address] 08/07/2012, 1:31 PM   LOS: 4 days

## 2012-08-07 NOTE — Discharge Summary (Signed)
PATIENT DETAILS Name: Gary [name] Age: 66 y.o. Sex: male Date of Birth: [birth year] MRN: [id number]. Admit Date: 08/03/2012 Admitting Physician: Leroy Sea, MD WUJ:[id number],NWGN [name], MD  Recommendations for Outpatient Follow-up:  Will need outpatient followup [facility] [place] [facility] to repair the inguinal hernia Counseling for alcohol and tobacco cessation  PRIMARY DISCHARGE DIAGNOSIS:  Active Problems:  Right inguinal hernia with incarceration Delirium  HTN (hypertension)  Abdominal pain  Alcohol abuse  Smoking history  Left inguinal hernia      PAST MEDICAL HISTORY: Past Medical History  Diagnosis Date  . Hypertension   . GERD (gastroesophageal reflux disease)   . Pneumonia     "5-7 years ago" (08/03/2012)  . Exertional dyspnea     "just recently" (08/03/2012)  . Delirium, acute 08/03/2012  . Prostate cancer     DISCHARGE MEDICATIONS:   Medication List     As of 08/07/2012  1:46 PM    STOP taking these medications         azithromycin 250 MG tablet   Commonly known as: ZITHROMAX      TAKE these medications         aspirin EC 81 MG tablet   Take 162 mg by mouth daily.      cholecalciferol 1000 UNITS tablet   Commonly known as: VITAMIN D   Take 1,000 Units by mouth daily.      ciprofloxacin 500 MG tablet   Commonly known as: CIPRO   Take 1 tablet (500 mg total) by mouth 2 (two) times daily.      feeding supplement Liqd   Take 237 mLs by mouth 2 (two) times daily between meals.      folic acid 400 MCG tablet   Commonly known as: FOLVITE   Take 400 mcg by mouth daily.      folic acid 1 MG tablet   Commonly known as: FOLVITE   Take 1 tablet (1 mg total) by mouth daily.      guaiFENesin-dextromethorphan 100-10 MG/5ML syrup   Commonly known as: ROBITUSSIN DM   Take 5 mLs by mouth every 4 (four) hours as needed for cough.      lisinopril 10 MG tablet   Commonly known as: PRINIVIL,ZESTRIL   Take 10 mg by mouth daily.      metroNIDAZOLE 500  MG tablet   Commonly known as: FLAGYL   Take 1 tablet (500 mg total) by mouth 3 (three) times daily.      multivitamin with minerals Tabs   Take 1 tablet by mouth daily.      nicotine 21 mg/24hr patch   Commonly known as: NICODERM CQ - dosed in mg/24 hours   Place 1 patch onto the skin daily.      omeprazole 20 MG capsule   Commonly known as: PRILOSEC   Take 20 mg by mouth daily.      thiamine 100 MG tablet   Take 1 tablet (100 mg total) by mouth daily.      vitamin E 400 UNIT capsule   Take 800 Units by mouth daily.        BRIEF HPI:  See H&P, Labs, Consult and Test reports for all details in brief,Gary [name] is a 66 y.o. male, who is an alcohol abuser along with one pack a day smoker for the last 60 years, presented to [facility] with confusion, apparently patient has had a dry cough for the last few days and was tried on CPAP  for the last 2 days, however he continued to become gradually more confused over the last 4-5 days prior to admission.  CONSULTATIONS:  None  PERTINENT RADIOLOGIC STUDIES: Dg Chest 2 View  08/03/2012  *RADIOLOGY REPORT*  Clinical Data: Chest pain  CHEST - 2 VIEW  Comparison: 07/16/2012  Findings: The heart and pulmonary vascularity are within normal limits.  The lungs are clear bilaterally.  No sizable effusion is seen.  No acute bony abnormality is noted.  IMPRESSION: No acute intrathoracic abnormality.   Original Report Authenticated By: Alcide Clever, M.D.    Ct Abdomen Pelvis W Contrast  08/03/2012  *RADIOLOGY REPORT*  Clinical Data: Left inguinal mass.  Right upper quadrant abdominal pain.  Prior history of inguinal hernia repair and hydrocele repair.  CT ABDOMEN AND PELVIS WITH CONTRAST  Technique:  Multidetector CT imaging of the abdomen and pelvis was performed following the standard protocol during bolus administration of intravenous contrast.  Contrast: OMNIPAQUE IOHEXOL 300 MG/ML. Oral contrast was also administered.   Comparison: CT abdomen and pelvis 07/20/2011.  Findings: Left inguinal hernia containing fat, though there is marked edema/inflammation within the herniated fat.  Interval repair of the previously identified right inguinal hernia, without evidence of recurrence.  Small to moderate amount of upper abdominal ascites in the perihepatic and perisplenic region and in the gastrohepatic ligament.  Scattered areas of focal hepatic steatosis.  No hepatic masses.  Gallbladder contracted and contains echogenic material consistent with sludge and/or small gallstones, not visualized on the prior examination.  Normal-appearing spleen. Calcifications within the head of the pancreas and in the uncinate region, without evidence of acute pancreatitis.  Normal adrenal glands and kidneys. 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  No significant lymphadenopathy.  Stable small hiatal hernia.  Stomach decompressed and otherwise unremarkable.  Normal-appearing small bowel.  Hyperemia and inflammation involving the proximal sigmoid colon; this inflammation surrounding the sigmoid colon involves the fat within the left inguinal hernia described above.  Small amount of ascites dependently in the pelvis.  Urinary bladder decompressed and unremarkable.  Prior prostatectomy without evidence of recurrent tumor mass in the prostate bed.  Visualized lung bases clear apart from the expected dependent atelectasis posteriorly.  Bone window images demonstrate mild degenerative changes involving the lower thoracic and lumbar spine but no evidence of osseous metastatic disease.  Degenerative changes are present in the sacroiliac joints with ankylosis on the right.  IMPRESSION:  1.  Left inguinal hernia containing indurated, inflamed fat.  This indurated fat may be secondary to sigmoid diverticulitis. 2.  Small to moderate amount of intra-abdominal and pelvic ascites.  3.  Scattered areas of focal hepatic steatosis. 4.  Sludge and/or  small gallstones within the contracted gallbladder. 5.  Chronic calcific pancreatitis. 6.  Stable small hiatal hernia. 7.  Interval right inguinal hernia repair without recurrence. 8.  Prior prostatectomy without evidence of recurrent tumor in the prostate bed.   Original Report Authenticated By: Hulan Saas, M.D.    Dg Chest Port 1 View  08/05/2012  *RADIOLOGY REPORT*  Clinical Data: Cough  PORTABLE CHEST - 1 VIEW  Comparison: 08/03/2012  Findings: Low lung volumes are present with associated subsegmental atelectasis at both lung bases.   Given the overall low lung volumes, heart and mediastinal contours are within normal limits and stable.  The lung fields appear clear with no signs of focal infiltrate or congestive failure.  No pleural fluid or significant peribronchial cuffing is seen.  Bony structures appear intact.  IMPRESSION: Overall lower  lung volumes with associated basilar atelectasis.  No new focal infiltrates seen.   Original Report Authenticated By: Rhodia Albright, M.D.      PERTINENT LAB RESULTS: CBC:  Basename 08/06/12 0515 08/05/12 1013  WBC 13.9* 10.2  HGB 15.6 16.2  HCT 42.6 44.4  PLT 181 146*   CMET CMP     Component Value Date/Time   NA 134* 08/06/2012 0515   K 3.2* 08/06/2012 0515   CL 98 08/06/2012 0515   CO2 21 08/06/2012 0515   GLUCOSE 106* 08/06/2012 0515   BUN 21 08/06/2012 0515   CREATININE 0.88 08/06/2012 0515   CALCIUM 8.6 08/06/2012 0515   PROT 7.9 08/03/2012 1150   ALBUMIN 3.1* 08/03/2012 1150   AST 50* 08/03/2012 1150   ALT 22 08/03/2012 1150   ALKPHOS 96 08/03/2012 1150   BILITOT 1.6* 08/03/2012 1150   GFRNONAA 87* 08/06/2012 0515   GFRAA >90 08/06/2012 0515    GFR Estimated Creatinine Clearance: 78.8 ml/min (by C-G formula based on Cr of 0.88). No results found for this basename: LIPASE:2,AMYLASE:2 in the last 72 hours No results found for this basename: CKTOTAL:3,CKMB:3,CKMBINDEX:3,TROPONINI:3 in the last 72 hours No components found with this  basename: POCBNP:3 No results found for this basename: DDIMER:2 in the last 72 hours No results found for this basename: HGBA1C:2 in the last 72 hours No results found for this basename: CHOL:2,HDL:2,LDLCALC:2,TRIG:2,CHOLHDL:2,LDLDIRECT:2 in the last 72 hours No results found for this basename: TSH,T4TOTAL,FREET3,T3FREE,THYROIDAB in the last 72 hours No results found for this basename: VITAMINB12:2,FOLATE:2,FERRITIN:2,TIBC:2,IRON:2,RETICCTPCT:2 in the last 72 hours Coags: No results found for this basename: PT:2,INR:2 in the last 72 hours Microbiology: Recent Results (from the past 240 hour(s))  URINE CULTURE     Status: Normal   Collection Time   08/03/12 11:19 AM      Component Value Range Status Comment   Specimen Description URINE, CLEAN CATCH   Final    Special Requests NONE   Final    Culture  Setup Time 08/04/2012 01:55   Final    Colony Count NO GROWTH   Final    Culture NO GROWTH   Final    Report Status 08/04/2012 FINAL   Final   CULTURE, BLOOD (ROUTINE X 2)     Status: Normal (Preliminary result)   Collection Time   08/03/12  1:00 PM      Component Value Range Status Comment   Specimen Description Blood   Final    Special Requests NONE   Final    Culture  Setup Time 08/03/2012 15:36   Final    Culture     Final    Value:        BLOOD CULTURE RECEIVED NO GROWTH TO DATE CULTURE WILL BE HELD FOR 5 DAYS BEFORE ISSUING A FINAL NEGATIVE REPORT   Report Status PENDING   Incomplete   CULTURE, BLOOD (ROUTINE X 2)     Status: Normal (Preliminary result)   Collection Time   08/03/12  1:05 PM      Component Value Range Status Comment   Specimen Description Blood   Final    Special Requests NONE   Final    Culture  Setup Time 08/03/2012 15:36   Final    Culture     Final    Value:        BLOOD CULTURE RECEIVED NO GROWTH TO DATE CULTURE WILL BE HELD FOR 5 DAYS BEFORE ISSUING A FINAL NEGATIVE REPORT   Report Status PENDING  Incomplete      BRIEF HOSPITAL COURSE:   Active  Problems:  Delirium  Significantly better than the past few days, but not almost back to baseline. Slow improvement since the past 1-2 days Multifactorial, likely related to EtOH abuse and possible infection (diverticulitis, UTI, URI)  Patient and spouse both think that the best place for patient to be is at home, they are requesting that he be discharged today  Partially incarcerated left inguinal hernia -This was reduced by surgery -CT scan of the abdomen showed inflamed fat -Patient was empirically started on Zosyn, his abdomen is now soft and almost nontender. He is able to tolerate a regular diet. [place] surgery is suggesting definite hernia repair as an outpatient, I have asked the spouse to make an appointment for the patient in the next few weeks.  EtOH abuse with alcohol withdrawal -Patient was given multivitamins, thiamine and folate -He was placed on Ativan per [facility] protocol -His mental status is significantly better than on admission, he has had slow improvement over the past few days. He is currently awake and alert and is able to follow commands. He has ambulated in the hallway with the nursing staff and as well as with physical therapy -I have counseled the patient about completely stopping alcohol, he understands the risk of progression into liver cirrhosis.  Generalized weakness -Secondary to alcohol withdrawal and incarcerated left inguinal hernia -He is improving, physical therapy has seen him and recommended a home health services. This will be arranged prior to discharge.  Hypertension -Continue with lisinopril  Tobacco abuse -Counseled extensively  TODAY-DAY OF DISCHARGE:  Subjective:   [employer] today has no headache,no chest abdominal pain,no new weakness tingling or numbness, feels much better wants to go home today.   Objective:   Blood pressure 104/77, pulse 69, temperature 98.1 F (36.7 C), temperature source Oral, resp. rate 20, height 5'  8" (1.727 m), weight 75.2 kg (165 lb 12.6 oz), SpO2 92.00%.  Intake/Output Summary (Last 24 hours) at 08/07/12 1346 Last data filed at 08/07/12 0846  Gross per 24 hour  Intake    340 ml  Output      0 ml  Net    340 ml    Exam Awake Alert, Oriented *3, No new F.N deficits, Normal affect [place].AT,PERRAL Supple Neck,No JVD, No cervical lymphadenopathy appriciated.  Symmetrical Chest wall movement, Good air movement bilaterally, CTAB RRR,No Gallops,Rubs or new Murmurs, No Parasternal Heave +ve B.Sounds, Abd Soft, Non tender, No organomegaly appriciated, No rebound -guarding or rigidity. No Cyanosis, Clubbing or edema, No new Rash or bruise  DISCHARGE CONDITION: Stable  DISPOSITION: HOME with home health services  DISCHARGE INSTRUCTIONS:    Activity:  As tolerated with Full fall precautions use walker/cane & assistance as needed  Diet recommendation: Heart Healthy diet   Follow-up Information    Follow up with GROSS,STEVEN C., MD. Schedule an appointment as soon as possible for a visit in 2 weeks.   Contact information:   [address] [place] [address] [phone]       Follow up with Pamelia Hoit, MD. Schedule an appointment as soon as possible for a visit in 1 week.   Contact information:   [address] [place] [address] [phone]         Total Time spent on discharge equals 45 minutes.  SignedJeoffrey Massed 08/07/2012 1:46 PM

## 2012-08-07 NOTE — Progress Notes (Signed)
Physical Therapy Treatment Patient Details Name: Gary [name] MRN: [id number] DOB: 1946-05-19 Today's Date: 08/07/2012 Time: 1201-1221 PT Time Calculation (min): 20 min  PT Assessment / Plan / Recommendation Comments on Treatment Session  Pt much improved from eval.  Pt/family would like RW and HHPT at [place].  Pt mobility is not at baseline level of function    Follow Up Recommendations  Home health PT     Does the patient have the potential to tolerate intense rehabilitation     Barriers to Discharge        Equipment Recommendations  Rolling walker with 5" wheels    Recommendations for Other Services    Frequency Min 3X/week   Plan Discharge plan remains appropriate    Precautions / Restrictions Precautions Precautions: Fall Restrictions Weight Bearing Restrictions: No       Mobility  Bed Mobility Bed Mobility: Not assessed Transfers Transfers: Sit to Stand;Stand to Sit Sit to Stand: 4: Min guard;With upper extremity assist;With armrests;From chair/3-in-1 Stand to Sit: 4: Min guard;With upper extremity assist;With armrests;To chair/3-in-1 Details for Transfer Assistance: VC's for hand placement with transfers Ambulation/Gait Ambulation/Gait Assistance: 5: Supervision Ambulation Distance (Feet): 100 Feet (Ambulated second time 100 as wel) Assistive device: Rolling walker Ambulation/Gait Assistance Details: Also walked about 25 feet with no device Gait Pattern: Step-to pattern;Decreased stride length Gait velocity: decreased and able to increase speed when cued    Exercises     PT Diagnosis:    PT Problem List:   PT Treatment Interventions:     PT Goals Acute Rehab PT Goals Pt will go Supine/Side to Sit: with modified independence PT Goal: Supine/Side to Sit - Progress: Progressing toward goal Pt will go Sit to Supine/Side: with modified independence PT Goal: Sit to Supine/Side - Progress: Progressing toward goal Pt will go Sit to Stand: with supervision PT  Goal: Sit to Stand - Progress: Progressing toward goal Pt will go Stand to Sit: with supervision PT Goal: Stand to Sit - Progress: Progressing toward goal Pt will Ambulate: 51 - 150 feet;with supervision;with least restrictive assistive device PT Goal: Ambulate - Progress: Progressing toward goal Pt will Go Up / Down Stairs: 3-5 stairs;with min assist;with least restrictive assistive device PT Goal: Up/Down Stairs - Progress: Other (comment) (not tested)  Visit Information  Last PT Received On: 08/07/12 Assistance Needed: +1    Subjective Data  Patient Stated Goal: Go home today   Cognition  Overall Cognitive Status: Appears within functional limits for tasks assessed/performed (Slow to process information, but appears grossly intact) Arousal/Alertness: Awake/alert Behavior During Session: [facility] for tasks performed    Balance     End of Session PT - End of Session Equipment Utilized During Treatment: Gait belt Activity Tolerance: Patient tolerated treatment well Patient left: in chair;with call bell/phone within reach;with chair alarm set;with family/visitor present Nurse Communication: Mobility status   GP     Donnella Sham 08/07/2012, 12:31 PM [place], PT  [phone] 08/07/2012

## 2012-08-09 LAB — CULTURE, BLOOD (ROUTINE X 2): Culture: NO GROWTH

## 2012-08-10 ENCOUNTER — Encounter (HOSPITAL_COMMUNITY): Payer: Self-pay

## 2012-08-10 ENCOUNTER — Inpatient Hospital Stay (HOSPITAL_COMMUNITY): Payer: Medicare Other

## 2012-08-10 ENCOUNTER — Inpatient Hospital Stay (HOSPITAL_COMMUNITY)
Admission: EM | Admit: 2012-08-10 | Discharge: 2012-08-29 | DRG: 438 | Disposition: E | Payer: Medicare Other | Attending: Pulmonary Disease | Admitting: Pulmonary Disease

## 2012-08-10 ENCOUNTER — Emergency Department (HOSPITAL_COMMUNITY): Payer: Medicare Other

## 2012-08-10 DIAGNOSIS — K852 Alcohol induced acute pancreatitis without necrosis or infection: Secondary | ICD-10-CM | POA: Diagnosis present

## 2012-08-10 DIAGNOSIS — K219 Gastro-esophageal reflux disease without esophagitis: Secondary | ICD-10-CM | POA: Diagnosis present

## 2012-08-10 DIAGNOSIS — N179 Acute kidney failure, unspecified: Secondary | ICD-10-CM | POA: Diagnosis present

## 2012-08-10 DIAGNOSIS — R6520 Severe sepsis without septic shock: Secondary | ICD-10-CM | POA: Diagnosis not present

## 2012-08-10 DIAGNOSIS — F10239 Alcohol dependence with withdrawal, unspecified: Secondary | ICD-10-CM | POA: Diagnosis not present

## 2012-08-10 DIAGNOSIS — B372 Candidiasis of skin and nail: Secondary | ICD-10-CM | POA: Diagnosis present

## 2012-08-10 DIAGNOSIS — A419 Sepsis, unspecified organism: Secondary | ICD-10-CM | POA: Diagnosis not present

## 2012-08-10 DIAGNOSIS — R109 Unspecified abdominal pain: Secondary | ICD-10-CM

## 2012-08-10 DIAGNOSIS — K7010 Alcoholic hepatitis without ascites: Secondary | ICD-10-CM | POA: Diagnosis not present

## 2012-08-10 DIAGNOSIS — Z7982 Long term (current) use of aspirin: Secondary | ICD-10-CM

## 2012-08-10 DIAGNOSIS — F1010 Alcohol abuse, uncomplicated: Secondary | ICD-10-CM

## 2012-08-10 DIAGNOSIS — K4090 Unilateral inguinal hernia, without obstruction or gangrene, not specified as recurrent: Secondary | ICD-10-CM | POA: Diagnosis present

## 2012-08-10 DIAGNOSIS — Z8546 Personal history of malignant neoplasm of prostate: Secondary | ICD-10-CM

## 2012-08-10 DIAGNOSIS — J9601 Acute respiratory failure with hypoxia: Secondary | ICD-10-CM | POA: Diagnosis present

## 2012-08-10 DIAGNOSIS — K862 Cyst of pancreas: Secondary | ICD-10-CM | POA: Diagnosis present

## 2012-08-10 DIAGNOSIS — I10 Essential (primary) hypertension: Secondary | ICD-10-CM | POA: Diagnosis present

## 2012-08-10 DIAGNOSIS — J9819 Other pulmonary collapse: Secondary | ICD-10-CM | POA: Diagnosis present

## 2012-08-10 DIAGNOSIS — K861 Other chronic pancreatitis: Secondary | ICD-10-CM | POA: Diagnosis present

## 2012-08-10 DIAGNOSIS — L02419 Cutaneous abscess of limb, unspecified: Secondary | ICD-10-CM | POA: Diagnosis present

## 2012-08-10 DIAGNOSIS — D684 Acquired coagulation factor deficiency: Secondary | ICD-10-CM | POA: Diagnosis present

## 2012-08-10 DIAGNOSIS — K863 Pseudocyst of pancreas: Secondary | ICD-10-CM

## 2012-08-10 DIAGNOSIS — K8020 Calculus of gallbladder without cholecystitis without obstruction: Secondary | ICD-10-CM

## 2012-08-10 DIAGNOSIS — D649 Anemia, unspecified: Secondary | ICD-10-CM | POA: Diagnosis not present

## 2012-08-10 DIAGNOSIS — K55059 Acute (reversible) ischemia of intestine, part and extent unspecified: Secondary | ICD-10-CM

## 2012-08-10 DIAGNOSIS — J189 Pneumonia, unspecified organism: Secondary | ICD-10-CM | POA: Diagnosis present

## 2012-08-10 DIAGNOSIS — E871 Hypo-osmolality and hyponatremia: Secondary | ICD-10-CM | POA: Diagnosis present

## 2012-08-10 DIAGNOSIS — J90 Pleural effusion, not elsewhere classified: Secondary | ICD-10-CM | POA: Diagnosis not present

## 2012-08-10 DIAGNOSIS — M79604 Pain in right leg: Secondary | ICD-10-CM | POA: Diagnosis present

## 2012-08-10 DIAGNOSIS — E46 Unspecified protein-calorie malnutrition: Secondary | ICD-10-CM | POA: Diagnosis present

## 2012-08-10 DIAGNOSIS — F10939 Alcohol use, unspecified with withdrawal, unspecified: Secondary | ICD-10-CM | POA: Diagnosis not present

## 2012-08-10 DIAGNOSIS — E872 Acidosis, unspecified: Secondary | ICD-10-CM | POA: Diagnosis not present

## 2012-08-10 DIAGNOSIS — D696 Thrombocytopenia, unspecified: Secondary | ICD-10-CM | POA: Diagnosis not present

## 2012-08-10 DIAGNOSIS — K709 Alcoholic liver disease, unspecified: Secondary | ICD-10-CM

## 2012-08-10 DIAGNOSIS — R188 Other ascites: Secondary | ICD-10-CM | POA: Diagnosis present

## 2012-08-10 DIAGNOSIS — K4030 Unilateral inguinal hernia, with obstruction, without gangrene, not specified as recurrent: Secondary | ICD-10-CM | POA: Diagnosis present

## 2012-08-10 DIAGNOSIS — K859 Acute pancreatitis without necrosis or infection, unspecified: Principal | ICD-10-CM | POA: Diagnosis present

## 2012-08-10 DIAGNOSIS — E875 Hyperkalemia: Secondary | ICD-10-CM | POA: Diagnosis present

## 2012-08-10 DIAGNOSIS — Z720 Tobacco use: Secondary | ICD-10-CM | POA: Diagnosis present

## 2012-08-10 DIAGNOSIS — J9600 Acute respiratory failure, unspecified whether with hypoxia or hypercapnia: Secondary | ICD-10-CM | POA: Diagnosis not present

## 2012-08-10 DIAGNOSIS — R7309 Other abnormal glucose: Secondary | ICD-10-CM | POA: Diagnosis present

## 2012-08-10 DIAGNOSIS — K7030 Alcoholic cirrhosis of liver without ascites: Secondary | ICD-10-CM | POA: Diagnosis present

## 2012-08-10 DIAGNOSIS — F1020 Alcohol dependence, uncomplicated: Secondary | ICD-10-CM | POA: Diagnosis present

## 2012-08-10 DIAGNOSIS — F17200 Nicotine dependence, unspecified, uncomplicated: Secondary | ICD-10-CM | POA: Diagnosis present

## 2012-08-10 DIAGNOSIS — K8689 Other specified diseases of pancreas: Secondary | ICD-10-CM | POA: Diagnosis not present

## 2012-08-10 DIAGNOSIS — D72829 Elevated white blood cell count, unspecified: Secondary | ICD-10-CM | POA: Diagnosis present

## 2012-08-10 DIAGNOSIS — R197 Diarrhea, unspecified: Secondary | ICD-10-CM | POA: Diagnosis not present

## 2012-08-10 DIAGNOSIS — G9340 Encephalopathy, unspecified: Secondary | ICD-10-CM | POA: Diagnosis not present

## 2012-08-10 DIAGNOSIS — N170 Acute kidney failure with tubular necrosis: Secondary | ICD-10-CM | POA: Diagnosis not present

## 2012-08-10 DIAGNOSIS — R933 Abnormal findings on diagnostic imaging of other parts of digestive tract: Secondary | ICD-10-CM

## 2012-08-10 DIAGNOSIS — E860 Dehydration: Secondary | ICD-10-CM | POA: Diagnosis present

## 2012-08-10 LAB — LIPASE, BLOOD: Lipase: 2151 U/L — ABNORMAL HIGH (ref 11–59)

## 2012-08-10 LAB — CBC WITH DIFFERENTIAL/PLATELET
Basophils Relative: 0 % (ref 0–1)
Eosinophils Absolute: 0 10*3/uL (ref 0.0–0.7)
HCT: 45.7 % (ref 39.0–52.0)
MCV: 101.6 fL — ABNORMAL HIGH (ref 78.0–100.0)
Monocytes Absolute: 1.4 10*3/uL — ABNORMAL HIGH (ref 0.1–1.0)
Monocytes Relative: 6 % (ref 3–12)
Neutrophils Relative %: 92 % — ABNORMAL HIGH (ref 43–77)
RBC: 4.5 MIL/uL (ref 4.22–5.81)
WBC: 24.6 10*3/uL — ABNORMAL HIGH (ref 4.0–10.5)

## 2012-08-10 LAB — CBC
HCT: 38.4 % — ABNORMAL LOW (ref 39.0–52.0)
Hemoglobin: 13.5 g/dL (ref 13.0–17.0)
MCH: 35.5 pg — ABNORMAL HIGH (ref 26.0–34.0)
MCV: 101.1 fL — ABNORMAL HIGH (ref 78.0–100.0)
RBC: 3.8 MIL/uL — ABNORMAL LOW (ref 4.22–5.81)
WBC: 18.6 10*3/uL — ABNORMAL HIGH (ref 4.0–10.5)

## 2012-08-10 LAB — COMPREHENSIVE METABOLIC PANEL
Albumin: 1.9 g/dL — ABNORMAL LOW (ref 3.5–5.2)
BUN: 49 mg/dL — ABNORMAL HIGH (ref 6–23)
Calcium: 9.1 mg/dL (ref 8.4–10.5)
Creatinine, Ser: 1.64 mg/dL — ABNORMAL HIGH (ref 0.50–1.35)
Potassium: 5.3 mEq/L — ABNORMAL HIGH (ref 3.5–5.1)
Total Protein: 7 g/dL (ref 6.0–8.3)

## 2012-08-10 LAB — GLUCOSE, CAPILLARY
Glucose-Capillary: 136 mg/dL — ABNORMAL HIGH (ref 70–99)
Glucose-Capillary: 117 mg/dL — ABNORMAL HIGH (ref 70–99)

## 2012-08-10 LAB — PROTIME-INR
INR: 1.56 — ABNORMAL HIGH (ref 0.00–1.49)
Prothrombin Time: 18.2 seconds — ABNORMAL HIGH (ref 11.6–15.2)

## 2012-08-10 LAB — ETHANOL: Alcohol, Ethyl (B): <11 mg/dL (ref 0–11)

## 2012-08-10 LAB — CREATININE, SERUM: GFR calc Af Amer: 46 mL/min — ABNORMAL LOW (ref >90)

## 2012-08-10 MED ORDER — VANCOMYCIN HCL 1000 MG IV SOLR
750.0000 mg | Freq: Two times a day (BID) | INTRAVENOUS | Status: DC
  Administered 2012-08-10 – 2012-08-11 (×2): 750 mg via INTRAVENOUS
  Filled 2012-08-10 (×3): qty 750

## 2012-08-10 MED ORDER — SODIUM CHLORIDE 0.9 % IJ SOLN
3.0000 mL | Freq: Two times a day (BID) | INTRAMUSCULAR | Status: DC
  Administered 2012-08-10 – 2012-08-16 (×6): 3 mL via INTRAVENOUS
  Administered 2012-08-16: 22:00:00 via INTRAVENOUS
  Administered 2012-08-17: 3 mL via INTRAVENOUS

## 2012-08-10 MED ORDER — GUAIFENESIN-DM 100-10 MG/5ML PO SYRP: 5.0000 mL | ORAL_SOLUTION | ORAL | Status: DC | PRN

## 2012-08-10 MED ORDER — ALBUTEROL SULFATE (5 MG/ML) 0.5% IN NEBU
2.5000 mg | INHALATION_SOLUTION | Freq: Four times a day (QID) | RESPIRATORY_TRACT | Status: DC
  Administered 2012-08-10 – 2012-08-15 (×21): 2.5 mg via RESPIRATORY_TRACT
  Filled 2012-08-10 (×23): qty 0.5

## 2012-08-10 MED ORDER — VANCOMYCIN HCL IN DEXTROSE 1-5 GM/200ML-% IV SOLN
1000.0000 mg | Freq: Once | INTRAVENOUS | Status: DC
  Filled 2012-08-10: qty 200

## 2012-08-10 MED ORDER — SODIUM CHLORIDE 0.9 % IV SOLN
INTRAVENOUS | Status: DC
  Administered 2012-08-10: 15:00:00 via INTRAVENOUS

## 2012-08-10 MED ORDER — DOCUSATE SODIUM 100 MG PO CAPS
100.0000 mg | ORAL_CAPSULE | Freq: Two times a day (BID) | ORAL | Status: DC
  Administered 2012-08-11 (×2): 100 mg via ORAL
  Filled 2012-08-10 (×7): qty 1

## 2012-08-10 MED ORDER — ACETAMINOPHEN 325 MG PO TABS: 650.0000 mg | ORAL_TABLET | Freq: Four times a day (QID) | ORAL | Status: DC | PRN

## 2012-08-10 MED ORDER — SODIUM CHLORIDE 0.9 % IV SOLN
1.0000 mg | Freq: Every day | INTRAVENOUS | Status: DC
  Filled 2012-08-10: qty 0.2

## 2012-08-10 MED ORDER — IPRATROPIUM BROMIDE 0.02 % IN SOLN
0.5000 mg | Freq: Four times a day (QID) | RESPIRATORY_TRACT | Status: DC
  Administered 2012-08-10 – 2012-08-13 (×11): 0.5 mg via RESPIRATORY_TRACT
  Filled 2012-08-10 (×13): qty 2.5

## 2012-08-10 MED ORDER — SODIUM CHLORIDE 0.9 % IV SOLN
Freq: Once | INTRAVENOUS | Status: DC
  Filled 2012-08-10: qty 1000

## 2012-08-10 MED ORDER — SODIUM CHLORIDE 0.9 % IV SOLN
INTRAVENOUS | Status: AC
  Administered 2012-08-10: 16:00:00 via INTRAVENOUS

## 2012-08-10 MED ORDER — PIPERACILLIN-TAZOBACTAM 3.375 G IVPB 30 MIN
3.3750 g | Freq: Once | INTRAVENOUS | Status: AC
  Administered 2012-08-10: 3.375 g via INTRAVENOUS
  Filled 2012-08-10: qty 50

## 2012-08-10 MED ORDER — INSULIN ASPART 100 UNIT/ML IV SOLN
5.0000 [IU] | Freq: Once | INTRAVENOUS | Status: AC
  Administered 2012-08-10: 5 [IU] via INTRAVENOUS
  Filled 2012-08-10: qty 0.05

## 2012-08-10 MED ORDER — THIAMINE HCL 100 MG/ML IJ SOLN
Freq: Once | INTRAVENOUS | Status: AC
  Administered 2012-08-11: 02:00:00 via INTRAVENOUS
  Filled 2012-08-10: qty 1000

## 2012-08-10 MED ORDER — ALBUTEROL SULFATE (5 MG/ML) 0.5% IN NEBU: 2.5000 mg | INHALATION_SOLUTION | RESPIRATORY_TRACT | Status: DC | PRN

## 2012-08-10 MED ORDER — LEVOFLOXACIN IN D5W 750 MG/150ML IV SOLN
750.0000 mg | INTRAVENOUS | Status: DC
  Filled 2012-08-10: qty 150

## 2012-08-10 MED ORDER — MORPHINE SULFATE 2 MG/ML IJ SOLN
1.0000 mg | INTRAMUSCULAR | Status: DC | PRN
  Administered 2012-08-10: 1 mg via INTRAVENOUS
  Filled 2012-08-10: qty 1

## 2012-08-10 MED ORDER — PIPERACILLIN-TAZOBACTAM 3.375 G IVPB
3.3750 g | Freq: Three times a day (TID) | INTRAVENOUS | Status: DC
  Administered 2012-08-10 – 2012-08-12 (×5): 3.375 g via INTRAVENOUS
  Filled 2012-08-10 (×9): qty 50

## 2012-08-10 MED ORDER — SODIUM POLYSTYRENE SULFONATE 15 GM/60ML PO SUSP
30.0000 g | Freq: Once | ORAL | Status: DC
  Filled 2012-08-10: qty 120

## 2012-08-10 MED ORDER — MORPHINE SULFATE 4 MG/ML IJ SOLN
4.0000 mg | Freq: Once | INTRAMUSCULAR | Status: AC
  Administered 2012-08-10: 4 mg via INTRAVENOUS
  Filled 2012-08-10: qty 1

## 2012-08-10 MED ORDER — DEXTROSE 50 % IV SOLN
INTRAVENOUS | Status: AC
  Administered 2012-08-10: 50 mL via INTRAVENOUS
  Filled 2012-08-10: qty 50

## 2012-08-10 MED ORDER — M.V.I. ADULT IV INJ
INJECTION | Freq: Once | INTRAVENOUS | Status: DC
  Filled 2012-08-10: qty 1000

## 2012-08-10 MED ORDER — ONDANSETRON HCL 4 MG/2ML IJ SOLN: 4.0000 mg | Freq: Four times a day (QID) | INTRAMUSCULAR | Status: DC | PRN

## 2012-08-10 MED ORDER — LEVOFLOXACIN IN D5W 750 MG/150ML IV SOLN
750.0000 mg | Freq: Once | INTRAVENOUS | Status: DC
  Administered 2012-08-10: 750 mg via INTRAVENOUS
  Filled 2012-08-10: qty 150

## 2012-08-10 MED ORDER — ENOXAPARIN SODIUM 40 MG/0.4ML ~~LOC~~ SOLN
40.0000 mg | SUBCUTANEOUS | Status: DC
  Administered 2012-08-10 – 2012-08-14 (×5): 40 mg via SUBCUTANEOUS
  Filled 2012-08-10 (×6): qty 0.4

## 2012-08-10 MED ORDER — THIAMINE HCL 100 MG/ML IJ SOLN
100.0000 mg | Freq: Every day | INTRAMUSCULAR | Status: DC
  Filled 2012-08-10: qty 1

## 2012-08-10 MED ORDER — DEXTROSE 50 % IV SOLN
1.0000 | Freq: Once | INTRAVENOUS | Status: AC
  Administered 2012-08-10: 50 mL via INTRAVENOUS

## 2012-08-10 MED ORDER — LORAZEPAM 2 MG/ML IJ SOLN
1.0000 mg | Freq: Four times a day (QID) | INTRAMUSCULAR | Status: DC | PRN
  Administered 2012-08-12 (×2): 1 mg via INTRAVENOUS
  Filled 2012-08-10 (×2): qty 1

## 2012-08-10 MED ORDER — ONDANSETRON HCL 4 MG PO TABS: 4.0000 mg | ORAL_TABLET | Freq: Four times a day (QID) | ORAL | Status: DC | PRN

## 2012-08-10 MED ORDER — ACETAMINOPHEN 650 MG RE SUPP: 650.0000 mg | Freq: Four times a day (QID) | RECTAL | Status: DC | PRN

## 2012-08-10 NOTE — Consult Note (Signed)
Reason for Consult:Gallstone pancreatitis Referring Physician: Meki Sustaita is an 66 y.o. [name].  HPI: 66 yo [name] with EtOH abuse s/p recent hospitalization for incarcerated left inguinal hernia (which was reduced by Dr. Andrey Campanile) complicated by EtOH withdrawal readmitted today for weakness and abdominal pain.  He complains of upper and lower abdominal pain.  He was found to be dehydrated, possible new onset of pneumonia, and laboratory signs of pancreatitis and elevated liver function tests.  We are consulted regarding his cholelithiasis and pancreatitis.  Originally, the patient was supposed to have outpatient evaluation of his left inguinal hernia with elective hernia repair.   Past Medical History  Diagnosis Date  . Hypertension   . GERD (gastroesophageal reflux disease)   . Pneumonia     "5-7 years ago" (08/03/2012)  . Exertional dyspnea     "just recently" (08/03/2012)  . Delirium, acute 08/03/2012  . Prostate cancer     Past Surgical History  Procedure Date  . Prostate surgery   . Hydrocele excision / repair 2010  . Inguinal hernia repair 12/08/2011    Procedure: HERNIA REPAIR INGUINAL ADULT;  Surgeon: Liz Malady, MD;  Location: [facility] OR;  Service: General;  Laterality: Right;  repair right inguinal hernia with mesh  . Irrigation and debridement abscess 03/01/2012    Procedure: IRRIGATION AND DEBRIDEMENT ABSCESS;  Surgeon: Ernestene Mention, MD;  Location: [facility] OR;  Service: General;  Laterality: Right;  Incision and drainage with debridement right breast abscess  . Tonsillectomy   . Cataract extraction w/ intraocular lens  implant, bilateral 2008    Family History  Problem Relation Age of Onset  . Heart disease Father     Social History:  reports that he has been smoking Cigarettes.  He has a 52 pack-year smoking history. He has never used smokeless tobacco. He reports that he drinks about 16.8 ounces of alcohol per week. He reports that he does not use illicit  drugs.  Allergies: No Known Allergies  Medications:  Prior to Admission:  Prescriptions prior to admission  Medication Sig Dispense Refill  . aspirin EC 81 MG tablet Take 162 mg by mouth daily.      . cholecalciferol (VITAMIN D) 1000 UNITS tablet Take 1,000 Units by mouth daily.      . ciprofloxacin (CIPRO) 500 MG tablet Take 1 tablet (500 mg total) by mouth 2 (two) times daily.  10 tablet  0  . ciprofloxacin (CIPRO) 500 MG tablet Take 500 mg by mouth 2 (two) times daily. For 10 days starting on 08/07/12      . feeding supplement (ENSURE COMPLETE) LIQD Take 237 mLs by mouth 2 (two) times daily between meals.  60 Bottle  0  . folic acid (FOLVITE) 1 MG tablet Take 1 tablet (1 mg total) by mouth daily.  30 tablet  0  . guaiFENesin-dextromethorphan (ROBITUSSIN DM) 100-10 MG/5ML syrup Take 5 mLs by mouth every 4 (four) hours as needed for cough.  118 mL    . lisinopril (PRINIVIL,ZESTRIL) 10 MG tablet Take 10 mg by mouth daily.      . metroNIDAZOLE (FLAGYL) 500 MG tablet Take 500 mg by mouth 3 (three) times daily. For 10 days starting on 08/07/12      . Multiple Vitamin (MULTIVITAMIN WITH MINERALS) TABS Take 1 tablet by mouth daily.  30 tablet  0  . omeprazole (PRILOSEC) 20 MG capsule Take 20 mg by mouth daily.      Marland Kitchen thiamine 100 MG tablet  Take 1 tablet (100 mg total) by mouth daily.  30 tablet  0  . vitamin E 400 UNIT capsule Take 800 Units by mouth daily.        Results for orders placed during the hospital encounter of Aug 16, 2012 (from the past 48 hour(s))  CBC WITH DIFFERENTIAL     Status: Abnormal   Collection Time   2012-08-16  1:19 PM      Component Value Range Comment   WBC 24.6 (*) 4.0 - 10.5 K/uL    RBC 4.50  4.22 - 5.81 MIL/uL    Hemoglobin 16.6  13.0 - 17.0 g/dL    HCT 16.1  09.6 - 04.5 %    MCV 101.6 (*) 78.0 - 100.0 fL    MCH 36.9 (*) 26.0 - 34.0 pg    MCHC 36.3 (*) 30.0 - 36.0 g/dL    RDW 40.9  81.1 - 91.4 %    Platelets 273  150 - 400 K/uL    Neutrophils Relative 92 (*) 43 -  77 %    Neutro Abs 22.6 (*) 1.7 - 7.7 K/uL    Lymphocytes Relative 2 (*) 12 - Gary %    Lymphs Abs 0.5 (*) 0.7 - 4.0 K/uL    Monocytes Relative 6  3 - 12 %    Monocytes Absolute 1.4 (*) 0.1 - 1.0 K/uL    Eosinophils Relative 0  0 - 5 %    Eosinophils Absolute 0.0  0.0 - 0.7 K/uL    Basophils Relative 0  0 - 1 %    Basophils Absolute 0.0  0.0 - 0.1 K/uL   COMPREHENSIVE METABOLIC PANEL     Status: Abnormal   Collection Time   2012/08/16  1:33 PM      Component Value Range Comment   Sodium 128 (*) 135 - 145 mEq/L    Potassium 5.3 (*) 3.5 - 5.1 mEq/L    Chloride 93 (*) 96 - 112 mEq/L    CO2 18 (*) 19 - 32 mEq/L    Glucose, Bld 140 (*) 70 - 99 mg/dL    BUN 49 (*) 6 - 23 mg/dL    Creatinine, Ser 7.82 (*) 0.50 - 1.35 mg/dL    Calcium 9.1  8.4 - 95.6 mg/dL    Total Protein 7.0  6.0 - 8.3 g/dL    Albumin 1.9 (*) 3.5 - 5.2 g/dL    AST 96 (*) 0 - 37 U/L    ALT 60 (*) 0 - 53 U/L    Alkaline Phosphatase 133 (*) 39 - 117 U/L    Total Bilirubin 2.9 (*) 0.3 - 1.2 mg/dL    GFR calc non Af Amer 42 (*) >90 mL/min    GFR calc Af Amer 48 (*) >90 mL/min   LIPASE, BLOOD     Status: Abnormal   Collection Time   Aug 16, 2012  1:33 PM      Component Value Range Comment   Lipase 2151 (*) 11 - 59 U/L   ETHANOL     Status: Normal   Collection Time   Aug 16, 2012  1:33 PM      Component Value Range Comment   Alcohol, Ethyl (B) <11  0 - 11 mg/dL     Ct Abdomen Pelvis Wo Contrast  08-16-12  *RADIOLOGY REPORT*  Clinical Data: Abdominal pain, weakness, abdominal distention and pancreatitis.  CT ABDOMEN AND PELVIS WITHOUT CONTRAST  Technique:  Multidetector CT imaging of the abdomen and pelvis was performed following the standard protocol  without intravenous contrast.  Comparison: CT of the abdomen and pelvis dated 08/03/2012 and abdominal ultrasound earlier today.  Findings: Visualized lung bases show a small right pleural effusion and atelectasis of the right lower lobe.  Consistent with ultrasound findings, there  is significant enlargement of loculated perihepatic fluid collections which may be subcapsular and are exerting significant mass effect on the liver. Adjacent peripancreatic fluid collections are also identified extending into the porta hepatis and findings are most likely related to enlarging loculated pseudocysts.  There is no suggestion of abscess with internal fluid density suggesting simple fluid.  No air is identified in any of the loculated fluid collections.  Coarse calcifications in the head of the pancreas are again identified consistent with chronic pancreatitis.  No significant peripancreatic inflammatory changes are identified.  The gallbladder contains high density material consistent with gallstones and sludge identified by ultrasound.  There is no evidence of biliary ductal dilatation.  The spleen is normal in size.  The kidneys are unremarkable.  There is no evidence of bowel obstruction. A small amount of ascites is present in the pelvis.  There is stranding and nodularity present in the mesentery and omentum which is nonspecific, and may simply relate to edema and prominent vessels.  A component of potential carcinomatosis cannot be entirely excluded, especially given more normal appearance of the mesenteric and omental fat on a prior CT in 2012. No discrete masses or enlarged lymph nodes are identified. There is a stable left inguinal hernia.  No evidence of incarcerated bowel.  The bladder is unremarkable.  IMPRESSION:  1.  Significant enlargement of peripancreatic fluid collections which appear at least partially subcapsular and exert significant mass effect on both right and left lobes of the liver.  Given history of pancreatitis and appearance of the prior CT 7 days ago, these are suggestive of enlarging pseudocysts. 2.  Nonspecific nodularity and edema within the mesentery and omentum without discrete focal mass.  This may simply relate to edema and hypoproteinemia.  However, follow-up would  be indicated to exclude any progressive evidence of carcinomatosis.  3.  Stable left inguinal hernia.   Original Report Authenticated By: Irish Lack, M.D.    US Abdomen Complete  08/09/2012  *RADIOLOGY REPORT*  Clinical Data:  Pancreatitis.  ABDOMEN ULTRASOUND  Technique:  Complete abdominal ultrasound examination was performed including evaluation of the liver, gallbladder, bile ducts, pancreas, kidneys, spleen, IVC, and abdominal aorta.  Comparison:  CT of the abdomen dated 08/03/2012  Findings:  Gallbladder:  The gallbladder is contracted and contains echogenic material, likely representing a combination of gallstones and biliary sludge.  There is no associated gallbladder wall thickening, sonographic Murphy's sign or focal pericholecystic fluid.  Common Bile Duct:  Normal caliber of 4 mm.  Liver:  The liver is echogenic and shows characteristics of potential early cirrhosis without focal mass.  IVC:  Patent throughout its visualized course in the abdomen.  Pancreas:  There is limited visualization of the pancreas. Pancreatic ductal caliber of 3 mm is mildly prominent in the head of the pancreas.  Spleen:  The spleen is of normal echotexture and size.  Kidneys:  Both kidneys measure approximately 12 cm and are unremarkable by ultrasound, demonstrating no evidence of hydronephrosis, significant atrophy or focal lesions.  Abdominal Aorta:  Normal caliber abdominal aorta.  Moderate amount of ascites is present, predominately surrounding the liver.  Overall volume appears somewhat more prominent compared to the recent CT study one week ago.  Fluid may be partially  loculated and does appear to be exerting some mass effect on the capsule of the liver.  Part of this fluid could also conceivably represent evolving pseudocyst formation given the history pancreatitis and evidence of chronic pancreatitis by prior CT.  IMPRESSION:  1.  The gallbladder contains echogenic material, likely a combination of gallstones  and echogenic sludge. No evidence to suggest acute cholecystitis or biliary obstruction. 2.  Probable increase in ascites compared to the CT examination 7 days ago.  Fluid visualized by ultrasound is predominately around the liver and does appear to be exerting some mass effect on the capsule of the liver.  Given appearance, this could also potentially represent evolving pseudocyst formation given the presence of pancreatitis.   Original Report Authenticated By: Irish Lack, M.D.    Dg Chest Port 1 View  08/27/2012  *RADIOLOGY REPORT*  Clinical Data: Left-sided chest pain and shortness of breath.  PORTABLE CHEST - 1 VIEW  Comparison: 08/05/2012  Findings: There is increase in atelectasis at the right lung base with associated volume loss and elevation of the right hemidiaphragm.  Opacity at the medial left lung base present which is felt to most likely represent atelectasis.  However, underlying infiltrate or even pulmonary nodule is not completely excluded. Recommend follow-up PA and lateral chest films at a later time.  IMPRESSION: Worsening right basilar atelectasis with associated volume loss. Medial left lower lung opacity, likely atelectasis.  Follow-up is recommended as above.   Original Report Authenticated By: Irish Lack, M.D.     ROS  REVIEW OF SYSTEMS:  Constitutional:  No weight loss, night sweats, Fevers, chills, fatigue.  HEENT:  No headaches, Difficulty swallowing,Tooth/dental problems,Sore throat,  No sneezing, itching, ear ache, nasal congestion, post nasal drip,  Cardio-vascular:  No chest pain, Orthopnea, PND, + swelling in lower extremities: no anasarca, dizziness, palpitations  GI:  + abdominal pain, loss of appetite Resp:  No shortness of breath with exertion or at rest. No excess mucus, no productive cough, No non-productive cough, No coughing up of blood.No change in color of mucus.No wheezing.No chest wall deformity  Skin:  no rash or lesions.  GU:  no dysuria,  change in color of urine, no urgency or frequency. No flank pain.  Musculoskeletal:  No joint pain or swelling. No decreased range of motion. No back pain.  Psych:  No change in mood or affect. No depression or anxiety. No memory loss.  Blood pressure 103/74, pulse 102, temperature 97.4 F (36.3 C), temperature source Oral, resp. rate 18, SpO2 96.00%. Physical Exam General appearance :Awake, alert, unkempt looking, slightly lethargic. Does not appear to be in any distress. Speech is slow but clear  HEENT: Atraumatic and Normocephalic, pupils equally reactive to light and accomodation  Neck: supple, no JVD. No cervical lymphadenopathy.  Chest:Good air entry bilaterally, sounds, coarse rhonchi heard in all lung zones.  CVS: S1 S2 regular, no murmurs. Slightly tachycardic  Abdomen: distended; minimal bowel sounds; no significant tenderness Extremities: B/L Lower Ext shows 1+ edema, both legs are warm to touch, there are bilateral erythematous areas in his lower extremities. Bilateral lower extremities are tender to touch but both his legs are warm.  Neurology: Awake alert, and oriented X 3, CN II-XII intact, Non focal  Skin:No Rash    Assessment/Plan: 1.  Cholelithiasis 2.  Pancreatitis - etiology biliary vs EtOH abuse; with enlarging fluid collections in abdomen 3.  No signs of acute cholecystitis 4.  EtOH abuse 5.  Significant dehydration  Recommendations 1.  IV hydration 2.  Bowel rest - strict NPO 3.  Follow LFT's/ Lipase - if bilirubin remains elevated, will likely need pre-operative ERCP before considering laparoscopic cholecystectomy 4.  Agree with consulting gastroenterology to help manage this complex patient. 5.  Would not consider percutaneous drainage of any of the fluid collections at this time.  You run the risk of seeding these with bacteria and turning a sterile pseudocyst into an infected pseudocyst. 6.  If patient's medical condition remains stable and his  pancreatitis resolves, will possibly consider laparoscopic cholecystectomy.  The patient will be at higher risk for complications due to his EtOH abuse, recent withdrawal, and malnutrition.    Mala Gibbard K. 08/02/2012, 6:43 PM

## 2012-08-10 NOTE — ED Notes (Signed)
Phlebotomy back at the bedside.  

## 2012-08-10 NOTE — ED Notes (Signed)
IV attempt x 2 unsuccessful. Phlebotomy at the bedside.

## 2012-08-10 NOTE — ED Notes (Signed)
Dr. Caporossi at the bedside. 

## 2012-08-10 NOTE — ED Provider Notes (Signed)
History     CSN: [id number]  Arrival date & time 08/08/2012  1223   First MD Initiated Contact with Patient 08/07/2012 1240      Chief Complaint  Patient presents with  . Shortness of Breath  . Fatigue    (Consider location/radiation/quality/duration/timing/severity/associated sxs/prior treatment) Patient is a 66 y.o. male presenting with shortness of breath. The history is provided by the patient and medical records. The history is limited by the condition of the patient.  Shortness of Breath  Associated symptoms include cough and shortness of breath. Pertinent negatives include no chest pain and no fever.   66 year old, male, who was recently discharged from the hospital after treatment.  For altered mental status, and urinary tract infection, presents to the emergency department with dehydration, cough, shortness of breath, fatigue, and lower extremity pain.  He denies nausea, vomiting, or diarrhea.  He denies a fever.  He denies chest pain.  He states that he smoked up until his last admission, just a few days ago, and he is an alcoholic, as well.  He denies a history of cirrhosis.  Level V caveat applies for severe illness.  Past Medical History  Diagnosis Date  . Hypertension   . GERD (gastroesophageal reflux disease)   . Pneumonia     "5-7 years ago" (08/03/2012)  . Exertional dyspnea     "just recently" (08/03/2012)  . Delirium, acute 08/03/2012  . Prostate cancer     Past Surgical History  Procedure Date  . Prostate surgery   . Hydrocele excision / repair 2010  . Inguinal hernia repair 12/08/2011    Procedure: HERNIA REPAIR INGUINAL ADULT;  Surgeon: Liz Malady, MD;  Location: [facility] OR;  Service: General;  Laterality: Right;  repair right inguinal hernia with mesh  . Irrigation and debridement abscess 03/01/2012    Procedure: IRRIGATION AND DEBRIDEMENT ABSCESS;  Surgeon: Ernestene Mention, MD;  Location: [facility] OR;  Service: General;  Laterality: Right;  Incision and drainage  with debridement right breast abscess  . Tonsillectomy   . Cataract extraction w/ intraocular lens  implant, bilateral 2008    Family History  Problem Relation Age of Onset  . Heart disease Father     History  Substance Use Topics  . Smoking status: Current Every Day Smoker -- 1.0 packs/day for 52 years    Types: Cigarettes  . Smokeless tobacco: Never Used  . Alcohol Use: 16.8 oz/week    28 Shots of liquor per week     Comment: 08/03/2012 states he drinks 4-5 drinks of liquor daily- last drink 07/30/12      Review of Systems  Constitutional: Negative for fever, chills and diaphoresis.  HENT: Negative for congestion.   Respiratory: Positive for cough and shortness of breath. Negative for chest tightness.   Cardiovascular: Positive for leg swelling. Negative for chest pain.  Gastrointestinal: Negative for nausea, vomiting, abdominal pain and diarrhea.  Skin: Negative for rash.  Neurological: Positive for weakness. Negative for headaches.  Hematological: Does not bruise/bleed easily.  Psychiatric/Behavioral: Negative for confusion.  All other systems reviewed and are negative.    Allergies  Review of patient's allergies indicates no known allergies.  Home Medications   Current Outpatient Rx  Name  Route  Sig  Dispense  Refill  . ASPIRIN EC 81 MG PO TBEC   Oral   Take 162 mg by mouth daily.         Marland Kitchen VITAMIN D 1000 UNITS PO TABS   Oral  Take 1,000 Units by mouth daily.         Marland Kitchen CIPROFLOXACIN HCL 500 MG PO TABS   Oral   Take 1 tablet (500 mg total) by mouth 2 (two) times daily.   10 tablet   0   . CIPROFLOXACIN HCL 500 MG PO TABS   Oral   Take 500 mg by mouth 2 (two) times daily. For 10 days starting on 08/07/12         . ENSURE COMPLETE PO LIQD   Oral   Take 237 mLs by mouth 2 (two) times daily between meals.   60 Bottle   0   . FOLIC ACID 1 MG PO TABS   Oral   Take 1 tablet (1 mg total) by mouth daily.   30 tablet   0   . GUAIFENESIN-DM  100-10 MG/5ML PO SYRP   Oral   Take 5 mLs by mouth every 4 (four) hours as needed for cough.   118 mL      . LISINOPRIL 10 MG PO TABS   Oral   Take 10 mg by mouth daily.         Marland Kitchen METRONIDAZOLE 500 MG PO TABS   Oral   Take 500 mg by mouth 3 (three) times daily. For 10 days starting on 08/07/12         . ADULT MULTIVITAMIN W/MINERALS CH   Oral   Take 1 tablet by mouth daily.   30 tablet   0   . OMEPRAZOLE 20 MG PO CPDR   Oral   Take 20 mg by mouth daily.         . THIAMINE HCL 100 MG PO TABS   Oral   Take 1 tablet (100 mg total) by mouth daily.   30 tablet   0   . VITAMIN E 400 UNITS PO CAPS   Oral   Take 800 Units by mouth daily.           BP 103/74  Pulse 111  Temp 97.4 F (36.3 C) (Oral)  Resp 18  SpO2 94%  Physical Exam  Nursing note and vitals reviewed. Constitutional: He is oriented to person, place, and time. No distress.       Disheveled listless  HENT:  Head: Normocephalic and atraumatic.       Dry oral mucosa  Eyes: Conjunctivae normal and EOM are normal.  Neck: Normal range of motion. Neck supple.  Cardiovascular: Regular rhythm, normal heart sounds and intact distal pulses.        Tachycardia  Pulmonary/Chest: Effort normal. No respiratory distress. He has rales.       Bilateral Rales  Abdominal: Soft. He exhibits no distension. There is no tenderness.  Musculoskeletal: He exhibits edema and tenderness.  Neurological: He is alert and oriented to person, place, and time. No cranial nerve deficit.  Skin: Skin is warm and dry.  Psychiatric: He has a normal mood and affect. Thought content normal.    ED Course  Procedures (including critical care time) 66 year old, male, presents to the ER with shortness of breath, cough, dehydration, and bilateral lower extremity, swelling.  He recently was admitted to the hospital for urinary tract infection, and treated for alcoholism.  He denies recent alcohol use.  We will do a chest x-ray, to  evaluate his Rales for possible pneumonia, as well as laboratory testing  Labs Reviewed  CBC WITH DIFFERENTIAL - Abnormal; Notable for the following:    WBC 24.6 (*)  MCV 101.6 (*)     MCH 36.9 (*)     MCHC 36.3 (*)     Neutrophils Relative 92 (*)     Neutro Abs 22.6 (*)     Lymphocytes Relative 2 (*)     Lymphs Abs 0.5 (*)     Monocytes Absolute 1.4 (*)     All other components within normal limits  COMPREHENSIVE METABOLIC PANEL - Abnormal; Notable for the following:    Sodium 128 (*)     Potassium 5.3 (*)     Chloride 93 (*)     CO2 18 (*)     Glucose, Bld 140 (*)     BUN 49 (*)     Creatinine, Ser 1.64 (*)     Albumin 1.9 (*)     AST 96 (*)     ALT 60 (*)     Alkaline Phosphatase 133 (*)     Total Bilirubin 2.9 (*)     GFR calc non Af Amer 42 (*)     GFR calc Af Amer 48 (*)     All other components within normal limits  LIPASE, BLOOD - Abnormal; Notable for the following:    Lipase 2151 (*)     All other components within normal limits  ETHANOL   Dg Chest Port 1 View  08/13/2012  *RADIOLOGY REPORT*  Clinical Data: Left-sided chest pain and shortness of breath.  PORTABLE CHEST - 1 VIEW  Comparison: 08/05/2012  Findings: There is increase in atelectasis at the right lung base with associated volume loss and elevation of the right hemidiaphragm.  Opacity at the medial left lung base present which is felt to most likely represent atelectasis.  However, underlying infiltrate or even pulmonary nodule is not completely excluded. Recommend follow-up PA and lateral chest films at a later time.  IMPRESSION: Worsening right basilar atelectasis with associated volume loss. Medial left lower lung opacity, likely atelectasis.  Follow-up is recommended as above.   Original Report Authenticated By: Irish Lack, M.D.      No diagnosis found.    MDM  Pancreatitis. Leukocytosis. Dehydration. Hyponatremia. Suspected pneumonia        Cheri Guppy, MD 08/08/2012  1527

## 2012-08-10 NOTE — ED Notes (Signed)
IV team paged for IV start. 

## 2012-08-10 NOTE — Progress Notes (Signed)
CT Scan results reveiwed,Case d/w GI and CCS (Dr Harlon Flor). Dr Juanda Chance suggested IV antibiotics and supportive care for now, GI will follow in am, she advised against a immediate perc drain. Will check LFT's daily.

## 2012-08-10 NOTE — ED Notes (Signed)
Pt placed on 2L O2 via nasal cannula per EDP request.

## 2012-08-10 NOTE — H&P (Signed)
PATIENT DETAILS Name: NATALIE CORR Age: 66 y.o. Sex: male Date of Birth: [birth year] Admit Date: 08/01/2012 VHQ:[id number],BMWU [name], MD   CHIEF COMPLAINT:  Generalized weakness, fatigue  HPI: Patient is a 66 year old male with a past medical history of hypertension, prostate cancer, alcohol abuse who was just recently discharged from the hospital comes back to the emergency room today for evaluation of the above noted complaints. Patient was recently hospitalized for delirium from alcohol withdrawal, and also abdominal pain secondary to incarcerated left inguinal hernia and that was manually reduced by general surgery. He was evaluated by physical therapy services, tolerating a regular meal and was discharged home at his own request to have outpatient followup with general surgery for internal hernia repair. Patient's wife was at bedside tells me that ever since discharge he has had one drink, has not been well at all. Over the past 2 days and he has also developed a cough. There is no fever. Although the patient denies any upper abdominal pain to me, he only points his lower abdominal area when asked if he is in pain his wife does tell me that over the past few days he has been complaining of upper abdominal pain. He was seen at his primary care practitioner's office today and referred to the emergency room. He was found to be dehydrated, suspected to have a new pneumonia on his chest x-ray and found to have laboratory data consistent with acute pancreatitis, I was asked to admit this patient for further evaluation and treatment. There is no history of diarrhea. He denies any ongoing shortness of breath. His spouse claims that for the past 2 days she has noticed that he has developed some bilateral lower leg swelling. Patient claims that for the past month bilateral foot pain and that everyone has been focusing on his abdominal issues. He claims his last bowel movement was yesterday  morning.    ALLERGIES:  No Known Allergies  PAST MEDICAL HISTORY: Past Medical History  Diagnosis Date  . Hypertension   . GERD (gastroesophageal reflux disease)   . Pneumonia     "5-7 years ago" (08/03/2012)  . Exertional dyspnea     "just recently" (08/03/2012)  . Delirium, acute 08/03/2012  . Prostate cancer     PAST SURGICAL HISTORY: Past Surgical History  Procedure Date  . Prostate surgery   . Hydrocele excision / repair 2010  . Inguinal hernia repair 12/08/2011    Procedure: HERNIA REPAIR INGUINAL ADULT;  Surgeon: Liz Malady, MD;  Location: [facility] OR;  Service: General;  Laterality: Right;  repair right inguinal hernia with mesh  . Irrigation and debridement abscess 03/01/2012    Procedure: IRRIGATION AND DEBRIDEMENT ABSCESS;  Surgeon: Ernestene Mention, MD;  Location: [facility] OR;  Service: General;  Laterality: Right;  Incision and drainage with debridement right breast abscess  . Tonsillectomy   . Cataract extraction w/ intraocular lens  implant, bilateral 2008    MEDICATIONS AT HOME: Prior to Admission medications   Medication Sig Start Date End Date Taking? Authorizing Provider  aspirin EC 81 MG tablet Take 162 mg by mouth daily.   Yes Historical Provider, MD  cholecalciferol (VITAMIN D) 1000 UNITS tablet Take 1,000 Units by mouth daily.   Yes Historical Provider, MD  ciprofloxacin (CIPRO) 500 MG tablet Take 1 tablet (500 mg total) by mouth 2 (two) times daily. 08/07/12  Yes Katherinne Mofield Levora Dredge, MD  ciprofloxacin (CIPRO) 500 MG tablet Take 500 mg by mouth 2 (two) times daily.  For 10 days starting on 08/07/12   Yes Historical Provider, MD  feeding supplement (ENSURE COMPLETE) LIQD Take 237 mLs by mouth 2 (two) times daily between meals. 08/07/12  Yes Bane Hagy Levora Dredge, MD  folic acid (FOLVITE) 1 MG tablet Take 1 tablet (1 mg total) by mouth daily. 08/07/12  Yes Nimsi Males Levora Dredge, MD  guaiFENesin-dextromethorphan (ROBITUSSIN DM) 100-10 MG/5ML syrup Take 5 mLs by mouth every 4 (four)  hours as needed for cough. 08/07/12  Yes Trew Sunde Levora Dredge, MD  lisinopril (PRINIVIL,ZESTRIL) 10 MG tablet Take 10 mg by mouth daily.   Yes Historical Provider, MD  metroNIDAZOLE (FLAGYL) 500 MG tablet Take 500 mg by mouth 3 (three) times daily. For 10 days starting on 08/07/12   Yes Historical Provider, MD  Multiple Vitamin (MULTIVITAMIN WITH MINERALS) TABS Take 1 tablet by mouth daily. 08/07/12  Yes Rees Matura Levora Dredge, MD  omeprazole (PRILOSEC) 20 MG capsule Take 20 mg by mouth daily.   Yes Historical Provider, MD  thiamine 100 MG tablet Take 1 tablet (100 mg total) by mouth daily. 08/07/12  Yes Shawnee Gambone Levora Dredge, MD  vitamin E 400 UNIT capsule Take 800 Units by mouth daily.   Yes Historical Provider, MD    FAMILY HISTORY: Family History  Problem Relation Age of Onset  . Heart disease Father     SOCIAL HISTORY:  reports that he has been smoking Cigarettes.  He has a 52 pack-year smoking history. He has never used smokeless tobacco. He reports that he drinks about 16.8 ounces of alcohol per week. He reports that he does not use illicit drugs.  REVIEW OF SYSTEMS:  Constitutional:   No  weight loss, night sweats,  Fevers, chills, fatigue.  HEENT:    No headaches, Difficulty swallowing,Tooth/dental problems,Sore throat,  No sneezing, itching, ear ache, nasal congestion, post nasal drip,   Cardio-vascular: No chest pain,  Orthopnea, PND, swelling in lower extremities, anasarca, dizziness, palpitations  GI:  No heartburn, indigestion, abdominal pain, nausea, vomiting, diarrhea, change in       bowel habits, loss of appetite  Resp: No shortness of breath with exertion or at rest.  No excess mucus, no productive cough, No non-productive cough,  No coughing up of blood.No change in color of mucus.No wheezing.No chest wall deformity  Skin:  no rash or lesions.  GU:  no dysuria, change in color of urine, no urgency or frequency.  No flank pain.  Musculoskeletal: No joint pain or swelling.   No decreased range of motion.  No back pain.  Psych: No change in mood or affect. No depression or anxiety.  No memory loss.   PHYSICAL EXAM: Blood pressure 103/74, pulse 111, temperature 97.4 F (36.3 C), temperature source Oral, resp. rate 18, SpO2 95.00%.  General appearance :Awake, alert,  unkempt looking, slightly lethargic. Does not appear to be in any distress. Speech is slow but clear \\HEENT : Atraumatic and Normocephalic, pupils equally reactive to light and accomodation Neck: supple, no JVD. No cervical lymphadenopathy.  Chest:Good air entry bilaterally,  sounds, coarse rhonchi heard in all lung zones.  CVS: S1 S2 regular, no murmurs. Slightly tachycardic  Abdomen: Bowel sounds present,  Mildly tender in the upper abdominal area and left lower  Quadrant without any rebound rigidity or guarding. Extremities: B/L Lower Ext shows 1+ edema, both legs are warm to touch,  there are bilateral erythematous areas in his lower extremities. Bilateral lower extremities are tender to touch but both his legs are warm. Neurology: Awake alert,  and oriented X 3, CN II-XII intact, Non focal Skin:No Rash Wounds:N/A  LABS ON ADMISSION:   Basename 08-Sep-2012 1333  NA 128*  K 5.3*  CL 93*  CO2 18*  GLUCOSE 140*  BUN 49*  CREATININE 1.64*  CALCIUM 9.1  MG --  PHOS --    Basename 09/08/2012 1333  AST 96*  ALT 60*  ALKPHOS 133*  BILITOT 2.9*  PROT 7.0  ALBUMIN 1.9*    Basename 2012/09/08 1333  LIPASE 2151*  AMYLASE --    Basename 2012/09/08 1319  WBC 24.6*  NEUTROABS 22.6*  HGB 16.6  HCT 45.7  MCV 101.6*  PLT 273   No results found for this basename: CKTOTAL:3,CKMB:3,CKMBINDEX:3,TROPONINI:3 in the last 72 hours No results found for this basename: DDIMER:2 in the last 72 hours No components found with this basename: POCBNP:3   RADIOLOGIC STUDIES ON ADMISSION: Dg Chest Port 1 View  Sep 08, 2012  *RADIOLOGY REPORT*  Clinical Data: Left-sided chest pain and shortness of breath.   PORTABLE CHEST - 1 VIEW  Comparison: 08/05/2012  Findings: There is increase in atelectasis at the right lung base with associated volume loss and elevation of the right hemidiaphragm.  Opacity at the medial left lung base present which is felt to most likely represent atelectasis.  However, underlying infiltrate or even pulmonary nodule is not completely excluded. Recommend follow-up PA and lateral chest films at a later time.  IMPRESSION: Worsening right basilar atelectasis with associated volume loss. Medial left lower lung opacity, likely atelectasis.  Follow-up is recommended as above.   Original Report Authenticated By: Irish Lack, M.D.     ASSESSMENT AND PLAN: Present on Admission:  . Acute Pancreatitis -Although there is no clear-cut history of pain, the spouse does claim that complaining of upper abdominal pain. Patient during my evaluation only points to his left lower quadrant area where he has his hernia as the site of pain. His CT scan of the abdomen done last was positive for chronic calcific pancreatitis. Patient has a long-standing history of EtOH use however claims to have never had an episode of pancreatitis in the past. Although he did go home and have a drink, it appears that since admission he has not had heavy EtOH intake. CT scan of the abdomen done last admission also was suggestive of gallstones. He does have elevation in liver enzymes and may have had passed a gallstone as well-triggering pancreatitis -He does have a significant white count, 66 years old and has alteration in his LFTs and is at high risk for progression into severe pancreatitis. -We will admit him to a step down unit, keep him n.p.o. and start IV fluid hydration. -We will get a another CT of the abdomen as well as a ultrasound of the abdomen to better evaluate pathology. -Depending on the results of these studies we will consult other specialities-namely GI or [facility] [place] [facility].  Marland Kitchen PNA  (pneumonia) -He is coughing, his chest x-ray is suggestive of either pneumonia/atelectasis, he does have significant leukocytosis-given his recent hospitalization he may have healthcare associated pneumonia, therefore he was empirically covered with vancomycin, Zosyn and Levaquin. Blood cultures have been drawn.  -Will repeat a chest x-ray in the morning   . ARF (acute renal failure) -Likely prerenal given the very poor oral intake over the past few days per wife.  -We'll hydrate and recheck electrolytes in the morning   . Hyperkalemia -Likely secondary to acute renal failure and the use of lisinopril  -We will hold the  central, hydrate. Patient is coughing after attempting to drink Kayexalate, therefore will give him a small dose of IV insulin and dextrose and recheck electrolytes later this evening.   Marland Kitchen Hyponatremia -Likely secondary to dehydration.  -Recheck electrolytes after hydration.   Marland Kitchen Hernia, inguinal -This is a chronic issue, he does appear to have small hernia in this area.he is scheduled to see [facility] [place] [facility] as an outpatient to have repair. We will await CT scan abdomen results.   Marland Kitchen HTN (hypertension) -Will hold all BP meds as blood pressure not an issue currently.  -Monitor and restart as necessary.   . Alcohol abuse -Claims to have just one drink after discharge, alcohol level on admission also less than 11. Do not think at this time he will again she has withdrawal. However will place on MVI, thiamine and folate. Ativan when necessary.  Bilateral lower extremity pain -What the exact etiology is. Patient claims that his wife he 3 months. 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 He does appear to have bilateral lower extremity erythematous spots last patches, however both of his lower legs are warm. We will get a ABI. He also complains of some lower extremity swelling, he does have hypoalbuminemia however we'll get a 2-D echocardiogram to further evaluate as  well.  Further plan will depend as patient's clinical course evolves and further radiologic and laboratory data become available. Patient will be monitored closely.  DVT Prophylaxis: Prophylactic Lovenox  Code Status: -Full code  Total time spent for admission equals 60 minutes.  [facility] Pager [phone]  If 7PM-7AM, please contact night-coverage [email or web address] Password [email or web address] 08/24/2012, 4:40 PM

## 2012-08-10 NOTE — ED Notes (Signed)
Lab at the bedside drawing blood cultures

## 2012-08-10 NOTE — ED Notes (Signed)
Per [facility], pt from MD office in [place]. Increase c/o of SOB, generalized weakness, and abd distention since Saturday. Warm to touch per EMS, pale in color. Was here last week for 5 days for UTI and abd pain. Hx of alcoholism and drank something yesterday.

## 2012-08-10 NOTE — Progress Notes (Signed)
ANTIBIOTIC CONSULT NOTE - INITIAL  Pharmacy Consult for vancomycin, zosyn, levaquin Indication: rule out pneumonia  No Known Allergies  Patient Measurements: weight 75kg, height 5'8    Vital Signs: Temp: 97.4 F (36.3 C) (11/12 1241) Temp src: Oral (11/12 1241) BP: 103/74 mmHg (11/12 1241) Pulse Rate: 111  (11/12 1241) Intake/Output from previous day:   Intake/Output from this shift:    Labs:  Basename 08/08/2012 1333 08/02/2012 1319  WBC -- 24.6*  HGB -- 16.6  PLT -- 273  LABCREA -- --  CREATININE 1.64* --   The CrCl is unknown because both a height and weight (above a minimum accepted value) are required for this calculation. No results found for this basename: VANCOTROUGH:2,VANCOPEAK:2,VANCORANDOM:2,GENTTROUGH:2,GENTPEAK:2,GENTRANDOM:2,TOBRATROUGH:2,TOBRAPEAK:2,TOBRARND:2,AMIKACINPEAK:2,AMIKACINTROU:2,AMIKACIN:2, in the last 72 hours   Microbiology: Recent Results (from the past 720 hour(s))  URINE CULTURE     Status: Normal   Collection Time   08/03/12 11:19 AM      Component Value Range Status Comment   Specimen Description URINE, CLEAN CATCH   Final    Special Requests NONE   Final    Culture  Setup Time 08/04/2012 01:55   Final    Colony Count NO GROWTH   Final    Culture NO GROWTH   Final    Report Status 08/04/2012 FINAL   Final   CULTURE, BLOOD (ROUTINE X 2)     Status: Normal   Collection Time   08/03/12  1:00 PM      Component Value Range Status Comment   Specimen Description Blood   Final    Special Requests NONE   Final    Culture  Setup Time 08/03/2012 15:36   Final    Culture NO GROWTH 5 DAYS   Final    Report Status 08/09/2012 FINAL   Final   CULTURE, BLOOD (ROUTINE X 2)     Status: Normal   Collection Time   08/03/12  1:05 PM      Component Value Range Status Comment   Specimen Description Blood   Final    Special Requests NONE   Final    Culture  Setup Time 08/03/2012 15:36   Final    Culture NO GROWTH 5 DAYS   Final    Report Status  08/09/2012 FINAL   Final     Medical History: Past Medical History  Diagnosis Date  . Hypertension   . GERD (gastroesophageal reflux disease)   . Pneumonia     "5-7 years ago" (08/03/2012)  . Exertional dyspnea     "just recently" (08/03/2012)  . Delirium, acute 08/03/2012  . Prostate cancer     Medications:  See med rec   Assessment: Patient is a 66 y.o M recently hospitalized (08/03/12) for EtOH withdrawal and abdominal pain d/t left inguinal hernia. Wife reported that he has been c/o coughing and experiencing upper abdominal pain PTA. PCP referred patient to the ED today.  To start broad abx for suspected PNA. Scr 1.64 (est crcl~ 46).  Patient received zosyn 3.375gm at 4PM and levaquin 750 mg at 5:30 PM in the ED.  Goal of Therapy:  Vancomycin trough level 15-20 mcg/ml  Plan:  1) zosyn 3.375gm IV q8h (infuse over 4 hours) 2) Levaquin 750mg  IV q48h 3) vancomycin 750 mg IV q12h 4) monitor renal function and adjust abx doses when appropriate  Callista Hoh P 08/11/2012,5:21 PM

## 2012-08-11 DIAGNOSIS — J9600 Acute respiratory failure, unspecified whether with hypoxia or hypercapnia: Secondary | ICD-10-CM

## 2012-08-11 DIAGNOSIS — J9601 Acute respiratory failure with hypoxia: Secondary | ICD-10-CM | POA: Diagnosis present

## 2012-08-11 DIAGNOSIS — K859 Acute pancreatitis without necrosis or infection, unspecified: Principal | ICD-10-CM

## 2012-08-11 DIAGNOSIS — R933 Abnormal findings on diagnostic imaging of other parts of digestive tract: Secondary | ICD-10-CM

## 2012-08-11 DIAGNOSIS — R079 Chest pain, unspecified: Secondary | ICD-10-CM

## 2012-08-11 DIAGNOSIS — R7402 Elevation of levels of lactic acid dehydrogenase (LDH): Secondary | ICD-10-CM

## 2012-08-11 DIAGNOSIS — K862 Cyst of pancreas: Secondary | ICD-10-CM | POA: Diagnosis present

## 2012-08-11 DIAGNOSIS — R109 Unspecified abdominal pain: Secondary | ICD-10-CM

## 2012-08-11 DIAGNOSIS — K709 Alcoholic liver disease, unspecified: Secondary | ICD-10-CM

## 2012-08-11 DIAGNOSIS — E860 Dehydration: Secondary | ICD-10-CM | POA: Diagnosis present

## 2012-08-11 DIAGNOSIS — M79609 Pain in unspecified limb: Secondary | ICD-10-CM

## 2012-08-11 DIAGNOSIS — D72829 Elevated white blood cell count, unspecified: Secondary | ICD-10-CM | POA: Diagnosis present

## 2012-08-11 DIAGNOSIS — M79604 Pain in right leg: Secondary | ICD-10-CM | POA: Diagnosis present

## 2012-08-11 DIAGNOSIS — K8020 Calculus of gallbladder without cholecystitis without obstruction: Secondary | ICD-10-CM | POA: Diagnosis present

## 2012-08-11 DIAGNOSIS — B372 Candidiasis of skin and nail: Secondary | ICD-10-CM | POA: Diagnosis present

## 2012-08-11 LAB — COMPREHENSIVE METABOLIC PANEL
AST: 71 U/L — ABNORMAL HIGH (ref 0–37)
Albumin: 1.6 g/dL — ABNORMAL LOW (ref 3.5–5.2)
Calcium: 7.5 mg/dL — ABNORMAL LOW (ref 8.4–10.5)
Creatinine, Ser: 1.71 mg/dL — ABNORMAL HIGH (ref 0.50–1.35)
Sodium: 130 mEq/L — ABNORMAL LOW (ref 135–145)

## 2012-08-11 LAB — URINE MICROSCOPIC-ADD ON

## 2012-08-11 LAB — GLUCOSE, CAPILLARY
Glucose-Capillary: 124 mg/dL — ABNORMAL HIGH (ref 70–99)
Glucose-Capillary: 132 mg/dL — ABNORMAL HIGH (ref 70–99)
Glucose-Capillary: 121 mg/dL — ABNORMAL HIGH (ref 70–99)

## 2012-08-11 LAB — URINALYSIS, ROUTINE W REFLEX MICROSCOPIC
Glucose, UA: NEGATIVE mg/dL
Leukocytes, UA: NEGATIVE
Protein, ur: 30 mg/dL — AB
Specific Gravity, Urine: 1.028 (ref 1.005–1.030)
Urobilinogen, UA: 1 mg/dL (ref 0.0–1.0)

## 2012-08-11 LAB — CBC
MCH: 36.1 pg — ABNORMAL HIGH (ref 26.0–34.0)
MCV: 99.7 fL (ref 78.0–100.0)
Platelets: 278 10*3/uL (ref 150–400)
RDW: 14.5 % (ref 11.5–15.5)
WBC: 23 10*3/uL — ABNORMAL HIGH (ref 4.0–10.5)

## 2012-08-11 LAB — PROTIME-INR: INR: 1.65 — ABNORMAL HIGH (ref 0.00–1.49)

## 2012-08-11 MED ORDER — SODIUM CHLORIDE 0.9 % IV BOLUS (SEPSIS)
1000.0000 mL | Freq: Once | INTRAVENOUS | Status: AC
  Administered 2012-08-11: 1000 mL via INTRAVENOUS

## 2012-08-11 MED ORDER — MORPHINE SULFATE 2 MG/ML IJ SOLN: 2.0000 mg | INTRAMUSCULAR | Status: DC | PRN

## 2012-08-11 MED ORDER — VANCOMYCIN HCL IN DEXTROSE 1-5 GM/200ML-% IV SOLN
1000.0000 mg | INTRAVENOUS | Status: DC
Start: 2012-08-12 — End: 2012-08-12
  Administered 2012-08-12: 1000 mg via INTRAVENOUS
  Filled 2012-08-11: qty 200

## 2012-08-11 MED ORDER — SODIUM CHLORIDE 0.9 % IV SOLN
INTRAVENOUS | Status: DC
  Administered 2012-08-11 (×2): via INTRAVENOUS
  Administered 2012-08-12: 150 mL/h via INTRAVENOUS
  Administered 2012-08-13 (×3): via INTRAVENOUS

## 2012-08-11 MED ORDER — KETOCONAZOLE 2 % EX CREA
TOPICAL_CREAM | Freq: Two times a day (BID) | CUTANEOUS | Status: DC
  Administered 2012-08-11: 1 via TOPICAL
  Administered 2012-08-11 – 2012-08-12 (×2): via TOPICAL
  Administered 2012-08-12 – 2012-08-13 (×3): 1 via TOPICAL
  Administered 2012-08-14 – 2012-08-15 (×3): via TOPICAL
  Filled 2012-08-11 (×3): qty 15

## 2012-08-11 MED ORDER — FENTANYL CITRATE 0.05 MG/ML IJ SOLN
25.0000 ug | INTRAMUSCULAR | Status: DC | PRN
  Administered 2012-08-11: 50 ug via INTRAVENOUS
  Administered 2012-08-11 (×2): 25 ug via INTRAVENOUS
  Administered 2012-08-11: 50 ug via INTRAVENOUS
  Administered 2012-08-12: 25 ug via INTRAVENOUS
  Administered 2012-08-12: 50 ug via INTRAVENOUS
  Filled 2012-08-11 (×6): qty 2

## 2012-08-11 MED ORDER — KETOROLAC TROMETHAMINE 15 MG/ML IJ SOLN
15.0000 mg | Freq: Once | INTRAMUSCULAR | Status: AC
  Administered 2012-08-11: 15 mg via INTRAVENOUS
  Filled 2012-08-11: qty 1

## 2012-08-11 NOTE — Progress Notes (Signed)
ANTIBIOTIC CONSULT NOTE - INITIAL  Pharmacy Consult for vancomycin, zosyn, levaquin Indication: rule out pneumonia  No Known Allergies  Patient Measurements: weight 75kg, height 5'8 Height: 5\' 8"  (172.7 cm) Weight: 165 lb 5.5 oz (75 kg) IBW/kg (Calculated) : 68.4   Vital Signs: Temp: 98.1 F (36.7 C) (11/13 0740) Temp src: Axillary (11/13 0740) BP: 98/65 mmHg (11/13 0740) Pulse Rate: 102  (11/13 0740) Intake/Output from previous day: 11/12 0701 - 11/13 0700 In: 972.5 [I.V.:460; IV Piggyback:512.5] Out: 225 [Urine:225] Intake/Output from this shift: Total I/O In: -  Out: 125 [Urine:125]  Labs:  [facility] 08/11/12 0425 09-08-12 1845 Sep 08, 2012 1333 2012-09-08 1319  WBC 23.0* 18.6* -- 24.6*  HGB 13.5 13.5 -- 16.6  PLT 278 235 -- 273  LABCREA -- -- -- --  CREATININE 1.71* 1.70* 1.64* --   Estimated Creatinine Clearance: 40.6 ml/min (by C-G formula based on Cr of 1.71).  Microbiology: Recent Results (from the past 720 hour(s))  URINE CULTURE     Status: Normal   Collection Time   08/03/12 11:19 AM      Component Value Range Status Comment   Specimen Description URINE, CLEAN CATCH   Final    Special Requests NONE   Final    Culture  Setup Time 08/04/2012 01:55   Final    Colony Count NO GROWTH   Final    Culture NO GROWTH   Final    Report Status 08/04/2012 FINAL   Final   CULTURE, BLOOD (ROUTINE X 2)     Status: Normal   Collection Time   08/03/12  1:00 PM      Component Value Range Status Comment   Specimen Description Blood   Final    Special Requests NONE   Final    Culture  Setup Time 08/03/2012 15:36   Final    Culture NO GROWTH 5 DAYS   Final    Report Status 08/09/2012 FINAL   Final   CULTURE, BLOOD (ROUTINE X 2)     Status: Normal   Collection Time   08/03/12  1:05 PM      Component Value Range Status Comment   Specimen Description Blood   Final    Special Requests NONE   Final    Culture  Setup Time 08/03/2012 15:36   Final    Culture NO GROWTH 5 DAYS    Final    Report Status 08/09/2012 FINAL   Final   CULTURE, BLOOD (ROUTINE X 2)     Status: Normal (Preliminary result)   Collection Time   09/08/12  3:41 PM      Component Value Range Status Comment   Specimen Description BLOOD HAND RIGHT   Final    Special Requests BOTTLES DRAWN AEROBIC ONLY 5CC   Final    Culture  Setup Time 2012/09/08 22:36   Final    Culture     Final    Value:        BLOOD CULTURE RECEIVED NO GROWTH TO DATE CULTURE WILL BE HELD FOR 5 DAYS BEFORE ISSUING A FINAL NEGATIVE REPORT   Report Status PENDING   Incomplete   CULTURE, BLOOD (ROUTINE X 2)     Status: Normal (Preliminary result)   Collection Time   September 08, 2012  3:50 PM      Component Value Range Status Comment   Specimen Description BLOOD HAND LEFT   Final    Special Requests BOTTLES DRAWN AEROBIC ONLY 6CC   Final    Culture  Setup Time 08-15-2012 22:37   Final    Culture     Final    Value:        BLOOD CULTURE RECEIVED NO GROWTH TO DATE CULTURE WILL BE HELD FOR 5 DAYS BEFORE ISSUING A FINAL NEGATIVE REPORT   Report Status PENDING   Incomplete      Assessment: Patient is a 66 y.o M recently hospitalized (08/03/12) for EtOH withdrawal and abdominal pain d/t left inguinal hernia. Wife reported that he has been c/o coughing and experiencing upper abdominal pain PTA. PCP referred patient to the ED today.  To start broad abx for suspected PNA. Scr 1.71 (est crcl~ 40).    Goal of Therapy:  Vancomycin trough level 15-20 mcg/ml  Plan:  1) zosyn 3.375gm IV q8h (infuse over 4 hours) 2) Levaquin 750mg  IV q48h 3) change vancomycin to 1g IV q24 4) monitor renal function and adjust abx doses when appropriate  Mickeal Skinner 08/11/2012,11:45 AM

## 2012-08-11 NOTE — Progress Notes (Signed)
[facility]  Patient Status: Active (receiving services up to time of hospitalization)  [facility] is providing the following services: PT Was referred for PT but admitted to the hospital prior to being seen.  If patient discharges after hours, please call [phone].   Gary [name] 08/11/2012, 12:35 PM

## 2012-08-11 NOTE — Progress Notes (Signed)
OT Cancellation Note  Patient Details Name: Gary [name] MRN: [id number] DOB: July 29, 1946   Cancelled Treatment:    Reason Eval/Treat Not Completed: Other (comment) (Pt. working with PT, and refuisng further activity)  Jeani Hawking M [phone] 08/11/2012, 11:30 AM

## 2012-08-11 NOTE — Progress Notes (Signed)
0220 Pt complaining of increased pain, burning and tingling in bilateral lower extremities. Pt refused tylenol prn per suppository. BP 91/66.  Craige Cotta, NP  notified and made aware of pt complaints. New orders received.

## 2012-08-11 NOTE — Progress Notes (Signed)
Speech Language Pathology  Patient Details Name: JAIVIAN BATTAGLINI MRN: [id number] DOB: 1946/09/22 Today's Date: 08/11/2012 Time:  -       Spoke with Dr. [name] who stated to cancel order for swallow assessment as pt. Has pancreatitis and won't be able to eat for several days.  Please reorder if desired.  Breck Coons [place].Ed [employer] [phone]  08/11/2012

## 2012-08-11 NOTE — Consult Note (Addendum)
[facility] Consult: 10:42 AM 08/11/2012   Referring Provider:  Jerral Ralph Primary Care Physician:  Pamelia Hoit, MD Primary Gastroenterologist:  none   Reason for Consultation:  pancreatitis  HPI: Gary [name] is a 66 y.o. male.  Alcoholic who drinks 1/2 gallon rum every 3 days (wife drinks 1/2 gallon brandy in 3 days as well). Hx prostate cancer, GERD.  Right  Inguinal herniorrhaphy 12/08/11.  Right breast abscess and pilar cyst excision 03/02/12. Dental extractions 2 weeks ago.  Out pt course of abx for cough, bronchitis about 2 to 3 weeks ago. Cyst excised from back of neck 11/1.   Admitted to [facility] 11/5 - 11/8 with delirium, resp congestion, lower abdominal pain. Diagnosed with UTI (negative urine clx) and symptomatic, partially incarcerated left inguinal hernia.  CT scan showed chronic calcific pancreatitis, no changes of acute pancreatitis, GB sludge and small stones, focal fatty changes in liver, the left inguinal hernia, and small amount of ascites.  Surgery delayed to to AMS and alcohol withdrawal.   At discharge still had some pain in belly.  On day of discharge at home, pain persisted and was having labored breathing but was having BMs, no nausea.  Seen 11/12 at Dr Tawana Scale office at they arranged EMS transport back to [facility].  Lipase 2151, AST 96 ALT 60.  Last week the AST/ALt were 50/22 but no lipase was obtained. BUN/Creat: 51/1.7, compare to 21/0.8 08/06/12.  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 Also seen is nodularity/edema of mesentery that need to be followed to exclude carcinomatosis. Ultrasound confirms biliary sludge/stones, increased ascites, perihepatic fluid.   Surgery, Dr Dwain Sarna,  has seen and think, eventually, his GB should come out.  Obviously not ready for this yet.   ROS Several months of  anorexia.  No weight loss.  Increased abd girth No nose bleeds. New painful reddness and swelling in both lower legs Trouble breathing and cough.  Drooling.  No emesis. No vision problems. No falls.  Legs hurt.  No family hx of pancreas, gb, liver, colon disease. He, nor wife recall any prior colonoscopy or EGD.  Had ba swallow ordered by Dr Loreta Ave in 2009, but no longer sees her.   Past Medical History  Diagnosis Date  . Hypertension   . GERD (gastroesophageal reflux disease)   . Pneumonia     "5-7 years ago" (08/03/2012)  . Exertional dyspnea     "just recently" (08/03/2012)  . Delirium, acute 08/03/2012  . Prostate cancer     Past Surgical History  Procedure Date  . Prostate surgery   . Hydrocele excision / repair 2010  . Inguinal hernia repair 12/08/2011    Procedure: HERNIA REPAIR INGUINAL ADULT;  Surgeon: Liz Malady, MD;  Location: [facility] OR;  Service: General;  Laterality: Right;  repair right inguinal hernia with mesh  . Irrigation and debridement abscess 03/01/2012    Procedure: IRRIGATION AND DEBRIDEMENT ABSCESS;  Surgeon: Ernestene Mention, MD;  Location: [facility] OR;  Service: General;  Laterality: Right;  Incision and drainage with debridement right breast abscess  . Tonsillectomy   . Cataract extraction w/ intraocular lens  implant, bilateral 2008    Prior to Admission medications   Medication Sig Start Date End Date Taking? Authorizing Provider  aspirin EC 81 MG tablet Take 162 mg by mouth daily.   Yes Historical Provider, MD  cholecalciferol (VITAMIN D) 1000 UNITS  tablet Take 1,000 Units by mouth daily.   Yes Historical Provider, MD  ciprofloxacin (CIPRO) 500 MG tablet Take 1 tablet (500 mg total) by mouth 2 (two) times daily. 08/07/12  Yes Gary Levora Dredge, MD  ciprofloxacin (CIPRO) 500 MG tablet Take 500 mg by mouth 2 (two) times daily. For 10 days starting on 08/07/12   Yes Historical Provider, MD  feeding supplement (ENSURE COMPLETE) LIQD Take 237 mLs by mouth 2 (two)  times daily between meals. 08/07/12  Yes Gary Levora Dredge, MD  folic acid (FOLVITE) 1 MG tablet Take 1 tablet (1 mg total) by mouth daily. 08/07/12  Yes Gary Levora Dredge, MD  guaiFENesin-dextromethorphan (ROBITUSSIN DM) 100-10 MG/5ML syrup Take 5 mLs by mouth every 4 (four) hours as needed for cough. 08/07/12  Yes Gary Levora Dredge, MD  lisinopril (PRINIVIL,ZESTRIL) 10 MG tablet Take 10 mg by mouth daily.   Yes Historical Provider, MD  metroNIDAZOLE (FLAGYL) 500 MG tablet Take 500 mg by mouth 3 (three) times daily. For 10 days starting on 08/07/12   Yes Historical Provider, MD  Multiple Vitamin (MULTIVITAMIN WITH MINERALS) TABS Take 1 tablet by mouth daily. 08/07/12  Yes Gary Levora Dredge, MD  omeprazole (PRILOSEC) 20 MG capsule Take 20 mg by mouth daily.   Yes Historical Provider, MD  thiamine 100 MG tablet Take 1 tablet (100 mg total) by mouth daily. 08/07/12  Yes Gary Levora Dredge, MD  vitamin E 400 UNIT capsule Take 800 Units by mouth daily.   Yes Historical Provider, MD    Scheduled Meds:    .      . albuterol  2.5 mg Nebulization Q6H  .      Marland Kitchen docusate sodium  100 mg Oral BID  . enoxaparin (LOVENOX) injection  40 mg Subcutaneous Q24H  .      Marland Kitchen ipratropium  0.5 mg Nebulization Q6H  . ketoconazole   Topical BID  .      Marland Kitchen levofloxacin (LEVAQUIN) IV  750 mg Intravenous Q48H  .   Intravenous Once  .   Intravenous Once  . piperacillin-tazobactam (ZOSYN)  IV  3.375 g Intravenous Q8H  .    Intravenous Once  .   1,000 mL Intravenous Once  . sodium chloride  3 mL Intravenous Q12H  . vancomycin  750 mg Intravenous Q12H      . sodium chloride 150 mL/hr at 08/11/12 1034  .     PRN Meds: albuterol, fentaNYL, guaiFENesin-dextromethorphan, LORazepam, ondansetron (ZOFRAN) IV, ondansetron,    Allergies as of 08/27/2012  . (No Known Allergies)    Family History  Problem Relation Age of Onset  . Heart disease Father     History   Social History  . Marital Status: Married    Spouse  Name: N/A    Number of Children: N/A  . Years of Education: N/A   Occupational History  . Not on file.   Social History Main Topics  . Smoking status: Current Every Day Smoker -- 1.0 packs/day for 52 years    Types: Cigarettes  . Smokeless tobacco: Never Used  . Alcohol Use: 16.8 oz/week    28 Shots of liquor per week     Comment: 08/03/2012 states he drinks 4-5 drinks of liquor daily- last drink 07/30/12  . Drug Use: No  . Sexually Active: Not Currently   Other Topics Concern  . Not on file   Social History Narrative  . No narrative on file    PHYSICAL EXAM:  Vital signs in last 24 hours: Temp:  [97.4 F (36.3 C)-98.3 F (36.8 C)] 98.1 F (36.7 C) (11/13 0740) Pulse Rate:  [100-111] 102  (11/13 0740) Resp:  [11-19] 17  (11/13 0740) BP: (87-134)/(60-82) 98/65 mmHg (11/13 0740) SpO2:  [93 %-98 %] 94 % (11/13 0740) FiO2 (%):  [2 %] 2 % (11/12 1940) Weight:  [75 kg (165 lb 5.5 oz)] 75 kg (165 lb 5.5 oz) (11/13 0500)  General: unwell, unkempt, cachectic,acutely ill elderly WM.  Diaphoretic. Head:  No signs of trauma or swelling, drool stains at corners of mouth  Eyes:  No icterus, +arcus senilis and cloudy lens. Ears:  Seems HOH  Nose:  No discharge Mouth:  Dry MM, beefy red tongue with white thin, adherent plaque Neck:  No mass or JVD Lungs:  ronchi B.  Breathing mildly labored and has upper airway, wet/gurgling sounds Heart: Regular, slightly tachy Abdomen:  Tender on left.  No guard or rebound.  BS minimal, slight distention.  Firm, golf ball sized nodule in left groin GU:  No penile or scrotal edema. Rectal: deferred   Musc/Skeltl: no joint deformity Extremities:  Pedal and LE edema B.  Reddened patches, legs, feet are tender and warm to touch  Neurologic:  Oriented x 3.  Lethargic after getting pain meds.  Moves all 4s.  Slow speech.  Skin:  See foot/leg exam above. Two small telangectasia on his upper front chest Tattoos:  none Nodes:  No inguinal or cervical  adenopathy   Psych:  Cooperative, drowsy, not agitated.   Intake/Output from previous day: 11/12 0701 - 11/13 0700 In: 972.5 [I.V.:460; IV Piggyback:512.5] Out: 225 [Urine:225] Intake/Output this shift: Total I/O In: -  Out: 125 [Urine:125]  LAB RESULTS:  Basename 08/11/12 0425 08/21/2012 1845 08/16/2012 1319  WBC 23.0* 18.6* 24.6*  HGB 13.5 13.5 16.6  HCT 37.3* 38.4* 45.7  PLT 278 235 273   BMET Lab Results  Component Value Date   NA 130* 08/11/2012   NA 128* 08/16/2012   NA 134* 08/06/2012   K 4.2 08/11/2012   K 5.3* 08/21/2012   K 3.2* 08/06/2012   CL 96 08/11/2012   CL 93* 08/11/2012   CL 98 08/06/2012   CO2 17* 08/11/2012   CO2 18* 07/30/2012   CO2 21 08/06/2012   GLUCOSE 110* 08/11/2012   GLUCOSE 140* 08/14/2012   GLUCOSE 106* 08/06/2012   BUN 51* 08/11/2012   BUN 49* 08/20/2012   BUN 21 08/06/2012   CREATININE 1.71* 08/11/2012   CREATININE 1.70* 08/13/2012   CREATININE 1.64* 08/12/2012   CALCIUM 7.5* 08/11/2012   CALCIUM 9.1 08/14/2012   CALCIUM 8.6 08/06/2012   LFT  Basename 08/11/12 0425 08/07/2012 1333  PROT 5.7* 7.0  ALBUMIN 1.6* 1.9*  AST 71* 96*  ALT 44 60*  ALKPHOS 109 133*  BILITOT 2.3* 2.9*  BILIDIR -- --  IBILI -- --   PT/INR Lab Results  Component Value Date   INR 1.65* 08/11/2012   INR 1.56* 08/03/2012   INR 1.20 08/03/2012   RADIOLOGY STUDIES: Ct Abdomen Pelvis Wo Contrast 08/20/2012  *RADIOLOGY REPORT*  Clinical Data: Abdominal pain, weakness, abdominal distention and pancreatitis.  CT ABDOMEN AND PELVIS WITHOUT CONTRAST  Technique:  Multidetector CT imaging of the abdomen and pelvis was performed following the standard protocol without intravenous contrast.  Comparison: CT of the abdomen and pelvis dated 08/03/2012 and abdominal ultrasound earlier today.  Findings: Visualized lung bases show a small right pleural effusion and atelectasis of the  right lower lobe.  Consistent with ultrasound findings, there is significant enlargement of  loculated perihepatic fluid collections which may be subcapsular and are exerting significant mass effect on the liver. Adjacent peripancreatic fluid collections are also identified extending into the porta hepatis and findings are most likely related to enlarging loculated pseudocysts.  There is no suggestion of abscess with internal fluid density suggesting simple fluid.  No air is identified in any of the loculated fluid collections.  Coarse calcifications in the head of the pancreas are again identified consistent with chronic pancreatitis.  No significant peripancreatic inflammatory changes are identified.  The gallbladder contains high density material consistent with gallstones and sludge identified by ultrasound.  There is no evidence of biliary ductal dilatation.  The spleen is normal in size.  The kidneys are unremarkable.  There is no evidence of bowel obstruction. A small amount of ascites is present in the pelvis.  There is stranding and nodularity present in the mesentery and omentum which is nonspecific, and may simply relate to edema and prominent vessels.  A component of potential carcinomatosis cannot be entirely excluded, especially given more normal appearance of the mesenteric and omental fat on a prior CT in 2012. No discrete masses or enlarged lymph nodes are identified. There is a stable left inguinal hernia.  No evidence of incarcerated bowel.  The bladder is unremarkable.  IMPRESSION:  1.  Significant enlargement of peripancreatic fluid collections which appear at least partially subcapsular and exert significant mass effect on both right and left lobes of the liver.  Given history of pancreatitis and appearance of the prior CT 7 days ago, these are suggestive of enlarging pseudocysts. 2.  Nonspecific nodularity and edema within the mesentery and omentum without discrete focal mass.  This may simply relate to edema and hypoproteinemia.  However, follow-up would be indicated to exclude any  progressive evidence of carcinomatosis.  3.  Stable left inguinal hernia.   Original Report Authenticated By: Irish Lack, M.D.    US Abdomen Complete 08/26/2012  *RADIOLOGY REPORT*  Clinical Data:  Pancreatitis.  ABDOMEN ULTRASOUND  Technique:  Complete abdominal ultrasound examination was performed including evaluation of the liver, gallbladder, bile ducts, pancreas, kidneys, spleen, IVC, and abdominal aorta.  Comparison:  CT of the abdomen dated 08/03/2012  Findings:  Gallbladder:  The gallbladder is contracted and contains echogenic material, likely representing a combination of gallstones and biliary sludge.  There is no associated gallbladder wall thickening, sonographic Murphy's sign or focal pericholecystic fluid.  Common Bile Duct:  Normal caliber of 4 mm.  Liver:  The liver is echogenic and shows characteristics of potential early cirrhosis without focal mass.  IVC:  Patent throughout its visualized course in the abdomen.  Pancreas:  There is limited visualization of the pancreas. Pancreatic ductal caliber of 3 mm is mildly prominent in the head of the pancreas.  Spleen:  The spleen is of normal echotexture and size.  Kidneys:  Both kidneys measure approximately 12 cm and are unremarkable by ultrasound, demonstrating no evidence of hydronephrosis, significant atrophy or focal lesions.  Abdominal Aorta:  Normal caliber abdominal aorta.  Moderate amount of ascites is present, predominately surrounding the liver.  Overall volume appears somewhat more prominent compared to the recent CT study one week ago.  Fluid may be partially loculated and does appear to be exerting some mass effect on the capsule of the liver.  Part of this fluid could also conceivably represent evolving pseudocyst formation given the history pancreatitis and evidence  of chronic pancreatitis by prior CT.  IMPRESSION:  1.  The gallbladder contains echogenic material, likely a combination of gallstones and echogenic sludge. No  evidence to suggest acute cholecystitis or biliary obstruction. 2.  Probable increase in ascites compared to the CT examination 7 days ago.  Fluid visualized by ultrasound is predominately around the liver and does appear to be exerting some mass effect on the capsule of the liver.  Given appearance, this could also potentially represent evolving pseudocyst formation given the presence of pancreatitis.   Original Report Authenticated By: Irish Lack, M.D.    Dg Chest Port 1 View 08-24-2012   IMPRESSION: Worsening right basilar atelectasis with associated volume loss. Medial left lower lung opacity, likely atelectasis.  Follow-up is recommended as above.   Original Report Authenticated By: Irish Lack, M.D.     ENDOSCOPIC STUDIES: none  IMPRESSION: *  Acute on chronic pancreatitis.  ? Biliary vs etoh related? Has gb sludge and stones on CT but CBD not diated.  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 *  Alcoholism.  Acute withdrawal last week during admission.  *  Renal insufficiency, dehydration likely.  *  Right lung atx.  *  Recent dental extractions x 4. *  Hyperglycemia, no hx DM *  Protein malnutrition, low albumin. *  Coagulopathy, mild.   *   Resp insufficiency, treated for bronchitis 2 weeks ago.    PLAN: *  Broad antibiotic coverage:  Vanc and Zosyn.  anelgesics as needed.  *  Fluids currently at 150 ml/hour.    LOS: 1 day   Jennye Moccasin  08/11/2012, 10:42 AM Pager: [phone]  GI ATTENDING  History, laboratories, and x-rays reviewed. Patient seen and examined. Chronic alcoholic with chronic calcific pancreatitis. Presents now with acute pancreatitis. Suspect secondary to alcohol. Significant changes on CT with severe interstitial inflammation and edema, but no necrosis. Elevated white blood cell count noted. Also noted cholelithiasis and abnormal liver tests. Cannot rule out biliary pancreatitis. However, his liver tests are more consistent with alcohol-related  liver consult. He has multiple electrolyte probations. Clearly with inadequate intravascular volume resuscitation as evidenced by elevated BUN and hemoconcentration. Examination is remarkable for a distended abdomen and decreased breath sounds at the bases. Otherwise negative. Recommend the following...  #1. Aggressive volume resuscitation. Increase IV fluids to 250 cc per hour. Recheck labs in a.m. #2. Need to correct his electrolyte abnormalities. Also, watch magnesium and phosphorus in this alcoholic patient #3. No clear role for antibiotics. #4. Would start NG tube feedings via Dobbhoff tube or equivalent, as I do not anticipate him resuming significant by mouth intake within a week #5. Watch for alcohol withdrawal #6. Pain control and incentive spirometer #7. Elective laparoscopic cholecystectomy with intraoperative cholangiogram when medically fit quite reasonable #8. Some drinking alcohol. Needs rehabilitation or some other program  Discussed with patient. Will follow. Thanks  [name]. [name]., M.D. [facility] Division of Gastroenterology

## 2012-08-11 NOTE — Progress Notes (Signed)
Clinical Social Work Department CLINICAL SOCIAL WORK PLACEMENT NOTE 08/11/2012  Patient:  Gary [name], Gary [name]  Account Number:  [id number] Admit date:  08/09/2012  Clinical Social Worker:  Unk Lightning, LCSW  Date/time:  08/11/2012 03:45 PM  Clinical Social Work is seeking post-discharge placement for this patient at the following level of care:   SKILLED NURSING   (*CSW will update this form in Epic as items are completed)   08/11/2012  Patient/family provided with [facility] Department of Clinical Social Work's list of facilities offering this level of care within the geographic area requested by the patient (or if unable, by the patient's family).  08/11/2012  Patient/family informed of their freedom to choose among providers that offer the needed level of care, that participate in Medicare, Medicaid or managed care program needed by the patient, have an available bed and are willing to accept the patient.  08/11/2012  Patient/family informed of [facility]' ownership interest in [facility], as well as of the fact that they are under no obligation to receive care at this facility.  PASARR submitted to EDS on  PASARR number received from EDS on   FL2 transmitted to all facilities in geographic area requested by pt/family on   FL2 transmitted to all facilities within larger geographic area on   Patient informed that his/her managed care company has contracts with or will negotiate with  certain facilities, including the following:     Patient/family informed of bed offers received:   Patient chooses bed at  Physician recommends and patient chooses bed at    Patient to be transferred to  on   Patient to be transferred to facility by   The following physician request were entered in Epic:   Additional Comments:

## 2012-08-11 NOTE — Progress Notes (Signed)
VASCULAR LAB PRELIMINARY  ARTERIAL  ABI completed:  Unable to obtain pedal pressures/calculate ABI due to extreme pain at ankles and feet.  Patient could not take cuff inflation.  Waveforms suggest some arterial disease but unable to quantify without pedal pressures.  Waveforms easily obtained.      RIGHT    LEFT    PRESSURE WAVEFORM  PRESSURE WAVEFORM  BRACHIAL 101 Triphasic  BRACHIAL 99 Triphasic   DP  Monophasic  DP  Monophasic   AT   AT    PT  Monophasic  PT  Triphasic   PER   PER    GREAT TOE  NA GREAT TOE  NA    RIGHT LEFT  ABI       Gary [name], RVT 08/11/2012, 11:23 AM

## 2012-08-11 NOTE — Progress Notes (Signed)
Subjective: No complaints of abdominal pain, complains of leg pain and feeling bad  Objective: Vital signs in last 24 hours: Temp:  [97.4 F (36.3 C)-98.3 F (36.8 C)] 98.1 F (36.7 C) (11/13 0740) Pulse Rate:  [100-111] 102  (11/13 0740) Resp:  [11-19] 17  (11/13 0740) BP: (87-134)/(60-82) 98/65 mmHg (11/13 0740) SpO2:  [93 %-98 %] 94 % (11/13 0740) FiO2 (%):  [2 %] 2 % (11/12 1940) Weight:  [165 lb 5.5 oz (75 kg)] 165 lb 5.5 oz (75 kg) (11/13 0500) Last BM Date: 08/09/12  Intake/Output from previous day: 11/12 0701 - 11/13 0700 In: 972.5 [I.V.:460; IV Piggyback:512.5] Out: 225 [Urine:225] Intake/Output this shift: Total I/O In: -  Out: 125 [Urine:125]  General appearance: cachectic GI: abdomen is nontender, has has moderately tender lih, right groin tenderness without hernia  Lab Results:   Basename 08/11/12 0425 08/17/2012 1845  WBC 23.0* 18.6*  HGB 13.5 13.5  HCT 37.3* 38.4*  PLT 278 235   BMET  Basename 08/11/12 0425 08/07/2012 1845 08/21/2012 1333  NA 130* -- 128*  K 4.2 -- 5.3*  CL 96 -- 93*  CO2 17* -- 18*  GLUCOSE 110* -- 140*  BUN 51* -- 49*  CREATININE 1.71* 1.70* --  CALCIUM 7.5* -- 9.1   PT/INR  Basename 08/11/12 0425 08/16/2012 1845  LABPROT 19.0* 18.2*  INR 1.65* 1.56*   ABG No results found for this basename: PHART:2,PCO2:2,PO2:2,HCO3:2 in the last 72 hours  Studies/Results: Ct Abdomen Pelvis Wo Contrast  08/07/2012  *RADIOLOGY REPORT*  Clinical Data: Abdominal pain, weakness, abdominal distention and pancreatitis.  CT ABDOMEN AND PELVIS WITHOUT CONTRAST  Technique:  Multidetector CT imaging of the abdomen and pelvis was performed following the standard protocol without intravenous contrast.  Comparison: CT of the abdomen and pelvis dated 08/03/2012 and abdominal ultrasound earlier today.  Findings: Visualized lung bases show a small right pleural effusion and atelectasis of the right lower lobe.  Consistent with ultrasound findings, there is  significant enlargement of loculated perihepatic fluid collections which may be subcapsular and are exerting significant mass effect on the liver. Adjacent peripancreatic fluid collections are also identified extending into the porta hepatis and findings are most likely related to enlarging loculated pseudocysts.  There is no suggestion of abscess with internal fluid density suggesting simple fluid.  No air is identified in any of the loculated fluid collections.  Coarse calcifications in the head of the pancreas are again identified consistent with chronic pancreatitis.  No significant peripancreatic inflammatory changes are identified.  The gallbladder contains high density material consistent with gallstones and sludge identified by ultrasound.  There is no evidence of biliary ductal dilatation.  The spleen is normal in size.  The kidneys are unremarkable.  There is no evidence of bowel obstruction. A small amount of ascites is present in the pelvis.  There is stranding and nodularity present in the mesentery and omentum which is nonspecific, and may simply relate to edema and prominent vessels.  A component of potential carcinomatosis cannot be entirely excluded, especially given more normal appearance of the mesenteric and omental fat on a prior CT in 2012. No discrete masses or enlarged lymph nodes are identified. There is a stable left inguinal hernia.  No evidence of incarcerated bowel.  The bladder is unremarkable.  IMPRESSION:  1.  Significant enlargement of peripancreatic fluid collections which appear at least partially subcapsular and exert significant mass effect on both right and left lobes of the liver.  Given history of  pancreatitis and appearance of the prior CT 7 days ago, these are suggestive of enlarging pseudocysts. 2.  Nonspecific nodularity and edema within the mesentery and omentum without discrete focal mass.  This may simply relate to edema and hypoproteinemia.  However, follow-up would be  indicated to exclude any progressive evidence of carcinomatosis.  3.  Stable left inguinal hernia.   Original Report Authenticated By: Irish Lack, M.D.    US Abdomen Complete  08/11/2012  *RADIOLOGY REPORT*  Clinical Data:  Pancreatitis.  ABDOMEN ULTRASOUND  Technique:  Complete abdominal ultrasound examination was performed including evaluation of the liver, gallbladder, bile ducts, pancreas, kidneys, spleen, IVC, and abdominal aorta.  Comparison:  CT of the abdomen dated 08/03/2012  Findings:  Gallbladder:  The gallbladder is contracted and contains echogenic material, likely representing a combination of gallstones and biliary sludge.  There is no associated gallbladder wall thickening, sonographic Murphy's sign or focal pericholecystic fluid.  Common Bile Duct:  Normal caliber of 4 mm.  Liver:  The liver is echogenic and shows characteristics of potential early cirrhosis without focal mass.  IVC:  Patent throughout its visualized course in the abdomen.  Pancreas:  There is limited visualization of the pancreas. Pancreatic ductal caliber of 3 mm is mildly prominent in the head of the pancreas.  Spleen:  The spleen is of normal echotexture and size.  Kidneys:  Both kidneys measure approximately 12 cm and are unremarkable by ultrasound, demonstrating no evidence of hydronephrosis, significant atrophy or focal lesions.  Abdominal Aorta:  Normal caliber abdominal aorta.  Moderate amount of ascites is present, predominately surrounding the liver.  Overall volume appears somewhat more prominent compared to the recent CT study one week ago.  Fluid may be partially loculated and does appear to be exerting some mass effect on the capsule of the liver.  Part of this fluid could also conceivably represent evolving pseudocyst formation given the history pancreatitis and evidence of chronic pancreatitis by prior CT.  IMPRESSION:  1.  The gallbladder contains echogenic material, likely a combination of gallstones and  echogenic sludge. No evidence to suggest acute cholecystitis or biliary obstruction. 2.  Probable increase in ascites compared to the CT examination 7 days ago.  Fluid visualized by ultrasound is predominately around the liver and does appear to be exerting some mass effect on the capsule of the liver.  Given appearance, this could also potentially represent evolving pseudocyst formation given the presence of pancreatitis.   Original Report Authenticated By: Irish Lack, M.D.    Dg Chest Port 1 View  08/02/2012  *RADIOLOGY REPORT*  Clinical Data: Left-sided chest pain and shortness of breath.  PORTABLE CHEST - 1 VIEW  Comparison: 08/05/2012  Findings: There is increase in atelectasis at the right lung base with associated volume loss and elevation of the right hemidiaphragm.  Opacity at the medial left lung base present which is felt to most likely represent atelectasis.  However, underlying infiltrate or even pulmonary nodule is not completely excluded. Recommend follow-up PA and lateral chest films at a later time.  IMPRESSION: Worsening right basilar atelectasis with associated volume loss. Medial left lower lung opacity, likely atelectasis.  Follow-up is recommended as above.   Original Report Authenticated By: Irish Lack, M.D.      Assessment/Plan: Pancreatitis LIH  His pancreatitis may be alcohol related as he has evidence of chronic disease on his ct but certainly gb can be a source as well.  He should likely have his gb removed at some point  but medically right now I think this is not a good option.  His pancreatitis and other issues need to be resolved.  He will also need LIH repaired. Would not do anything except follow the pancreatic collections for now.  LOS: 1 day    [facility] [place] 08/11/2012

## 2012-08-11 NOTE — Care Management Note (Signed)
  Page 1 of 1   08/11/2012     10:44:07 AM   CARE MANAGEMENT NOTE 08/11/2012  Patient:  Gary [name], Gary [name]   Account Number:  [id number]  Date Initiated:  08/11/2012  Documentation initiated by:  Alvira Philips Assessment:   66 yr-old male adm with dx of pancreatitis; lives with spouse, has rolling walker     HH arranged  HH-1 RN      [facility] agency  [facility]   Per UR Regulation:  Reviewed for med. necessity/level of care/duration of stay  Comments:  PCP:  Dr. Benedetto Goad  08/11/12 1040 Kinslei Labine RN BSN MSN CCM Pt was referred to [facility] for home health RN when d/c'd on 11/9.  Per [facility] liaison, SOC was scheduled for tomorrow.  They will begin services when pt d/c'd.

## 2012-08-11 NOTE — Progress Notes (Signed)
[facility] Progress Note [place] TEAM 1 - Stepdown/ICU TEAM   Gary [name] ZOX:[id number] DOB: 05-18-1946 DOA: 08/17/2012 PCP: Pamelia Hoit, MD  Brief narrative: 66 year old male patient with multiple medical problems including alcohol abuse. Recently discharged from the hospital after being treated for abdominal pain secondary to an incarcerated left inguinal hernia. The hernia was manually reduced by general surgery at that time. That hospitalization was complicated by acute delirium related to alcohol withdrawal. At that time the surgery team felt the patient was too debilitated to undergo an elective surgical procedure. Since discharge to home patient has not thrived. Two days after discharge he developed a cough without fever and later developed progressive abdominal pain. Because of his symptoms his wife encouraged him to follow up with his primary care doctor who referred him to the emergency room. In the emergency room the patient was found to be severely dehydrated and there was a question of a new pneumonia on his chest x-ray. Laboratory data revealed markedly elevated lipase greater than 2000 which was consistent with possible acute pancreatitis. In addition the patient has developed new focal lower extremity edema in the bilateral feet increasing to just above the ankles. Patient is also reporting significant pain in the feet. He apparently has chronic plantar surface pain and numbness. He was subsequently admitted to the step down unit for further monitoring and treatment of acute pancreatitis.  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 The pancreatitis was described as chronic calcific  which is consistent with alcoholic pancreatitis *Will benefit from insertion of PICC and initiation of parenteral nutrition - will plan for 11/14 *Continue supportive care with hydration and pain management *Monitor for ileus - patient reports has frequent flatus  Cholelithiases *Ultrasound revealed gallstones without evidence of acute cholecystitis or biliary obstruction *Once pseudocyst resolved patient can be reevaluated by surgery for elective cholecystectomy. Hopefully this can be done in conjunction with his left and right hernia repair  ARF (acute renal failure)/Dehydration *Baseline creatinine was 0.88 on 08/06/2012. Current creatinine has slightly increased to 1.71 with a BUN of 51 after admission so suspect despite IV fluids remains inadequately hydrated *Continue high rate IV fluids  Leukocytosis *Likely reactive and secondary to acute pancreatitis with pseudocyst *Follow CBC  Acute respiratory failure with hypoxia/?  HCAP (healthcare-associated pneumonia) *Likely has multifactorial etiology:    -Mechanical alterations in ventilation from abdominal distention   -When comparing chest x-ray to CT of the abdomen, the lung views on CT appear more consistent with a reactive pleural effusion secondary to the very large pseudocyst *Currently on broad-spectrum antibiotics - will complete a short 5 day course   HTN (hypertension) *Actually has a relative hypotension related to dehydration *Home lisinopril remains on hold  Alcohol abuse *Underwent complete detoxification during last admission *Wife confirms patient had only one alcoholic beverage after discharge to home *Patient counseled against further alcohol ingestion in the setting of chronic calcific pancreatitis with acute pseudocyst  Hyperkalemia *Resolved *Follow electrolytes  Lower extremity pain, bilateral/ Candidal dermatitis *Unclear etiology but suspect is neuropathic in nature *Pulses are easily palpable and the  feet are warm *B12 level is normal but we'll check a folate level *Also has skin changes consistent with candida dermatitis so we'll begin topical treatment *Supportive care with pain  medications-use fentanyl since blood pressure suboptimal  Tobacco abuse  Hyponatremia *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 Continues to awaken from sleep and yell out during our exam with complaints of neck and bilateral foot pain.   Objective: Blood pressure 98/65, pulse 102, temperature 98.1 F (36.7 C), temperature source Axillary, resp. rate 17, height 5\' 8"  (1.727 m), weight 75 kg (165 lb 5.5 oz), SpO2 94.00%.  Intake/Output Summary (Last 24 hours) at 08/11/12 1209 Last data filed at 08/11/12 0858  Gross per 24 hour  Intake  972.5 ml  Output    350 ml  Net  622.5 ml     Exam: General: No acute respiratory distress - easily agitated secondary to pain Lungs: Clear to auscultation bilaterally without wheezes but diminished in the bases bilaterally with crackles in the right base Cardiovascular: Regular rate and rhythm without murmur gallop or rub normal S1 and S2, low systolic blood pressure, IV fluid infusing at 150 cc per hour, has focal lower extremity edema bilaterally from just above the ankles to the toes, peripheral pulses are palpable Abdomen: Nontender, nondistended, soft, bowel sounds positive, no rebound, no ascites, no appreciable mass Musculoskeletal: No significant cyanosis, clubbing of bilateral lower extremities Neurological:  Patient is arousable and once aroused is quite alert but drifts off to sleep easily, seems quite agitated with expressed foot pain, moves all extremities x4 but is weak estimated to be 4/5, exam nonfocal Skin: Skin is normal except for erythema in a pattern on both lower extremities consistent with contact from previous socks-the erythema becomes more scattered on the feet that appears consistent with fungus  Data Reviewed: Basic Metabolic Panel:  Lab 08/11/12 1478 08/12/2012 1845 07/30/2012 1333 08/06/12 0515 08/05/12 1013  NA 130* -- 128* 134* 132*  K 4.2 -- 5.3* 3.2* 3.5  CL 96 -- 93* 98 95*  CO2 17* -- 18* 21 21  GLUCOSE 110* -- 140* 106* 91  BUN 51* -- 49* 21 15  CREATININE 1.71* 1.70* 1.64* 0.88 0.85  CALCIUM 7.5* -- 9.1 8.6 8.7  MG -- -- -- -- --  PHOS -- -- -- -- --   Liver Function Tests:  Lab 08/11/12 0425 08/18/2012 1333  AST 71* 96*  ALT 44 60*  ALKPHOS 109 133*  BILITOT 2.3* 2.9*  PROT 5.7* 7.0  ALBUMIN 1.6* 1.9*    Lab 08/11/12 0425 08/21/2012 1333  LIPASE 1448* 2151*  AMYLASE -- --   CBC:  Lab 08/11/12 0425 08/26/2012 1845 08/19/2012 1319 08/06/12 0515 08/05/12 1013  WBC 23.0* 18.6* 24.6* 13.9* 10.2  NEUTROABS -- -- 22.6* -- --  HGB 13.5 13.5 16.6 15.6 16.2  HCT 37.3* 38.4* 45.7 42.6 44.4  MCV 99.7 101.1* 101.6* 99.8 100.7*  PLT 278 235 273 181 146*   CBG:  Lab 08/11/12 0811 08/11/12 0634 08/11/12 0414 08/11/12 0210 08/07/2012 2345  GLUCAP 121* 132* 124* 116* 122*    Recent Results (from the past 240 hour(s))  URINE CULTURE     Status: Normal   Collection Time   08/03/12 11:19 AM      Component Value Range Status Comment  Specimen Description URINE, CLEAN CATCH   Final    Special Requests NONE   Final    Culture  Setup Time 08/04/2012 01:55   Final    Colony Count NO GROWTH   Final    Culture NO GROWTH   Final    Report Status 08/04/2012 FINAL   Final   CULTURE, BLOOD (ROUTINE X 2)     Status: Normal   Collection Time   08/03/12  1:00 PM      Component  Value Range Status Comment   Specimen Description Blood   Final    Special Requests NONE   Final    Culture  Setup Time 08/03/2012 15:36   Final    Culture NO GROWTH 5 DAYS   Final    Report Status 08/09/2012 FINAL   Final   CULTURE, BLOOD (ROUTINE X 2)     Status: Normal   Collection Time   08/03/12  1:05 PM      Component Value Range Status Comment   Specimen Description Blood   Final    Special Requests NONE   Final    Culture  Setup Time 08/03/2012 15:36   Final    Culture NO GROWTH 5 DAYS   Final    Report Status 08/09/2012 FINAL   Final   CULTURE, BLOOD (ROUTINE X 2)     Status: Normal (Preliminary result)   Collection Time   08/06/2012  3:41 PM      Component Value Range Status Comment   Specimen Description BLOOD HAND RIGHT   Final    Special Requests BOTTLES DRAWN AEROBIC ONLY 5CC   Final    Culture  Setup Time 08/21/2012 22:36   Final    Culture     Final    Value:        BLOOD CULTURE RECEIVED NO GROWTH TO DATE CULTURE WILL BE HELD FOR 5 DAYS BEFORE ISSUING A FINAL NEGATIVE REPORT   Report Status PENDING   Incomplete   CULTURE, BLOOD (ROUTINE X 2)     Status: Normal (Preliminary result)   Collection Time   08/14/2012  3:50 PM      Component Value Range Status Comment   Specimen Description BLOOD HAND LEFT   Final    Special Requests BOTTLES DRAWN AEROBIC ONLY 6CC   Final    Culture  Setup Time 08/11/2012 22:37   Final    Culture     Final    Value:        BLOOD CULTURE RECEIVED NO GROWTH TO DATE CULTURE WILL BE HELD FOR 5 DAYS BEFORE ISSUING A FINAL NEGATIVE REPORT   Report Status PENDING   Incomplete      Studies:  Recent x-ray studies have been reviewed in detail by the Attending Physician  Scheduled Meds:  Reviewed in detail by the Attending Physician   Junious Silk, ANP [facility] Office  [phone] Pager [phone]  On-Call/Text Page:      [email or web address]      password [email or web address]  If 7PM-7AM, please contact night-coverage [email or web address] Password  [email or web address] 08/11/2012, 12:09 PM   LOS: 1 day   I have personally examined this patient and reviewed the entire database. I have reviewed the above note, made any necessary editorial changes, and agree with its content.  Lonia Blood, MD [facility]

## 2012-08-11 NOTE — Progress Notes (Signed)
Echocardiogram 2D Echocardiogram has been performed.  Ryna Beckstrom 08/11/2012, 11:56 AM

## 2012-08-11 NOTE — Progress Notes (Signed)
Clinical Social Work Department BRIEF PSYCHOSOCIAL ASSESSMENT 08/11/2012  Patient:  Gary [name], Gary [name]     Account Number:  [id number]     Admit date:  08/18/2012  Clinical Social Worker:  Dennison Bulla  Date/Time:  08/11/2012 03:45 PM  Referred by:  Physician  Date Referred:  08/11/2012 Referred for  Substance Abuse  SNF Placement   Other Referral:   Interview type:  Patient Other interview type:   Wife present    PSYCHOSOCIAL DATA Living Status:  FAMILY Admitted from facility:   Level of care:   Primary support name:  Debbie Primary support relationship to patient:  SPOUSE Degree of support available:   Adequate    CURRENT CONCERNS Current Concerns  Substance Abuse  Post-Acute Placement   Other Concerns:    SOCIAL WORK ASSESSMENT / PLAN CSW received referral to assist with substance abuse counseling. When CSW was reviewing chart, CSW observed that PT is recommending SNF placement. CSW met with patient and wife at bedside. Patient agreeable to wife involvement.    CSW introduced myself and explained role. Patient and wife reported feeling overwhelmed and stressed about hospitalization. Wife reports that patient was recently discharged from the hospital and then was readmitted. CSW spoke with patient regarding CSW role to assist with dc planning. Wife agreeable that she and patient need to discuss options. CSW briefly discussed SNF options and provided wife with SNF list. Wife reports that it would be better for CSW to follow up tomorrow. CSW left wife with CSW contact information and will follow up at a later time.    CSW completed FL2 and saved information in TLC. CSW can fax out patient once receiving permission.   Assessment/plan status:  Psychosocial Support/Ongoing Assessment of Needs Other assessment/ plan:   Will complete SBIRT at later time   Information/referral to community resources:   SNF list    PATIENT'S/FAMILY'S RESPONSE TO PLAN OF CARE: Patient  alert but disengaged. Patient reports he is in a lot of pain and is open to CSW consult at this time. Wife reports feeling stressed and feels it would be better to finish consult tomorrow. CSW to complete full assessment and SBIRT at later time.

## 2012-08-11 NOTE — Evaluation (Signed)
Physical Therapy Evaluation Patient Details Name: Gary [name] MRN: [id number] DOB: 11/23/1945 Today's Date: 08/11/2012 Time: 9528-4132 PT Time Calculation (min): 38 min  PT Assessment / Plan / Recommendation Clinical Impression  pt presents with Pancreatitis, PNA, Inguinal Hernia, and history of Etoh.  pt very painful in Bil feet and limits activity secondary to pain.  pt with decreased cognition, but question what is pt's baseline cognition.  pt would benefit from SNF at D/C to maximize Independence and decrease burden of care.      PT Assessment  Patient needs continued PT services    Follow Up Recommendations  SNF    Does the patient have the potential to tolerate intense rehabilitation      Barriers to Discharge None      Equipment Recommendations  None recommended by PT    Recommendations for Other Services     Frequency Min 3X/week    Precautions / Restrictions Precautions Precautions: Fall Restrictions Weight Bearing Restrictions: No   Pertinent Vitals/Pain Bil feet very painful, but pt does not rate.      Mobility  Bed Mobility Bed Mobility: Supine to Sit;Sitting - Scoot to Edge of Bed;Sit to Supine Supine to Sit: 3: Mod assist;With rails Sitting - Scoot to Edge of Bed: 2: Max assist Sit to Supine: 5: Supervision Details for Bed Mobility Assistance: pt needs max encouragement to participate and to initiate activity Transfers Transfers: Not assessed Ambulation/Gait Ambulation/Gait Assistance: Not tested (comment) Stairs: No Wheelchair Mobility Wheelchair Mobility: No    Shoulder Instructions     Exercises     PT Diagnosis: Difficulty walking;Generalized weakness  PT Problem List: Decreased strength;Decreased activity tolerance;Decreased balance;Decreased mobility;Decreased cognition;Decreased knowledge of use of DME;Decreased safety awareness;Pain PT Treatment Interventions: DME instruction;Gait training;Stair training;Functional mobility  training;Patient/family education;Therapeutic activities;Therapeutic exercise;Balance training   PT Goals Acute Rehab PT Goals PT Goal Formulation: With patient/family Time For Goal Achievement: 08/25/12 Potential to Achieve Goals: Good Pt will go Supine/Side to Sit: with modified independence PT Goal: Supine/Side to Sit - Progress: Goal set today Pt will go Sit to Supine/Side: with modified independence PT Goal: Sit to Supine/Side - Progress: Goal set today Pt will go Sit to Stand: with supervision PT Goal: Sit to Stand - Progress: Goal set today Pt will go Stand to Sit: with supervision PT Goal: Stand to Sit - Progress: Goal set today Pt will Ambulate: 51 - 150 feet;with supervision;with least restrictive assistive device PT Goal: Ambulate - Progress: Goal set today Pt will Go Up / Down Stairs: 3-5 stairs;with min assist;with least restrictive assistive device PT Goal: Up/Down Stairs - Progress: Goal set today  Visit Information  Last PT Received On: 08/11/12 Assistance Needed: +1    Subjective Data  Subjective: You can't come back tomorrow.   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 Communication Communication: Expressive difficulties (pt jumbling words and trouble with word finding.  )    Cognition  Overall Cognitive Status: Impaired Area of Impairment: Attention;Memory;Following commands;Safety/judgement;Problem solving Arousal/Alertness: Awake/alert Orientation Level: Disoriented to;Time;Situation Behavior  During Session: [facility] for tasks performed Current Attention Level: Focused Memory Deficits:  Decreased recall of past few weeks and of conversation with MD just prior to PT.   Following Commands: Follows one step commands with increased time Safety/Judgement: Decreased awareness of safety precautions;Decreased safety judgement for tasks assessed;Decreased awareness of need for assistance Awareness of Errors: Assistance required to identify errors made;Assistance required to correct errors made Problem Solving: pt slow to process and initiate    Extremity/Trunk Assessment Right Lower Extremity Assessment RLE ROM/Strength/Tone: Deficits RLE ROM/Strength/Tone Deficits: Generally weak, but unable to assess secondary to very painful below knees Left Lower Extremity Assessment LLE ROM/Strength/Tone: Deficits LLE ROM/Strength/Tone Deficits: Generally weak, but unable to assess secondary to very painful below knee.     Balance    End of Session PT - End of Session Activity Tolerance: Patient limited by pain Patient left: in bed;with call bell/phone within reach;with family/visitor present Nurse Communication: Mobility status  GP     Sunny Schlein, [place] [phone] 08/11/2012, 2:18 PM

## 2012-08-12 ENCOUNTER — Inpatient Hospital Stay (HOSPITAL_COMMUNITY): Payer: Medicare Other

## 2012-08-12 DIAGNOSIS — K862 Cyst of pancreas: Secondary | ICD-10-CM

## 2012-08-12 DIAGNOSIS — K4090 Unilateral inguinal hernia, without obstruction or gangrene, not specified as recurrent: Secondary | ICD-10-CM

## 2012-08-12 LAB — URINE CULTURE
Colony Count: NO GROWTH
Culture: NO GROWTH

## 2012-08-12 LAB — CBC
HCT: 39.9 % (ref 39.0–52.0)
Hemoglobin: 13.9 g/dL (ref 13.0–17.0)
MCH: 36.2 pg — ABNORMAL HIGH (ref 26.0–34.0)
MCV: 103.9 fL — ABNORMAL HIGH (ref 78.0–100.0)
RBC: 3.84 MIL/uL — ABNORMAL LOW (ref 4.22–5.81)
WBC: 17.2 10*3/uL — ABNORMAL HIGH (ref 4.0–10.5)

## 2012-08-12 LAB — COMPREHENSIVE METABOLIC PANEL
AST: 77 U/L — ABNORMAL HIGH (ref 0–37)
Albumin: 1.4 g/dL — ABNORMAL LOW (ref 3.5–5.2)
BUN: 46 mg/dL — ABNORMAL HIGH (ref 6–23)
Creatinine, Ser: 1.4 mg/dL — ABNORMAL HIGH (ref 0.50–1.35)
Total Protein: 5.9 g/dL — ABNORMAL LOW (ref 6.0–8.3)

## 2012-08-12 LAB — GLUCOSE, CAPILLARY
Glucose-Capillary: 136 mg/dL — ABNORMAL HIGH (ref 70–99)
Glucose-Capillary: 159 mg/dL — ABNORMAL HIGH (ref 70–99)
Glucose-Capillary: 134 mg/dL — ABNORMAL HIGH (ref 70–99)
Glucose-Capillary: 126 mg/dL — ABNORMAL HIGH (ref 70–99)

## 2012-08-12 LAB — MAGNESIUM: Magnesium: 2.6 mg/dL — ABNORMAL HIGH (ref 1.5–2.5)

## 2012-08-12 MED ORDER — VITAL AF 1.2 CAL PO LIQD
1000.0000 mL | ORAL | Status: DC
  Administered 2012-08-12: 1000 mL
  Filled 2012-08-12 (×5): qty 1000

## 2012-08-12 MED ORDER — SODIUM CHLORIDE 0.9 % IJ SOLN: 10.0000 mL | INTRAMUSCULAR | Status: DC | PRN

## 2012-08-12 MED ORDER — SODIUM CHLORIDE 0.9 % IJ SOLN
10.0000 mL | Freq: Two times a day (BID) | INTRAMUSCULAR | Status: DC
  Administered 2012-08-12 – 2012-08-22 (×12): 10 mL

## 2012-08-12 NOTE — Progress Notes (Signed)
Peripherally Inserted Central Catheter/Midline Placement  The IV Nurse has discussed with the patient and/or persons authorized to consent for the patient, the purpose of this procedure and the potential benefits and risks involved with this procedure.  The benefits include less needle sticks, lab draws from the catheter and patient may be discharged home with the catheter.  Risks include, but not limited to, infection, bleeding, blood clot (thrombus formation), and puncture of an artery; nerve damage and irregular heat beat.  Alternatives to this procedure were also discussed.  PICC/Midline Placement Documentation  PICC / Midline Double Lumen 08/12/12 PICC Right Basilic (Active)    Telephone consent   Stacie Glaze Horton 08/12/2012, 5:29 PM

## 2012-08-12 NOTE — Progress Notes (Addendum)
[facility] Progress Note [place] TEAM 1 - Stepdown/ICU TEAM   Gary [name] ION:[id number] DOB: Jul 13, 1946 DOA: 08/13/2012 PCP: Pamelia Hoit, MD  Brief narrative: 66 year old male patient with multiple medical problems including alcohol abuse. Recently discharged from the hospital after being treated for abdominal pain secondary to an incarcerated left inguinal hernia. The hernia was manually reduced by general surgery at that time. That hospitalization was complicated by acute delirium related to alcohol withdrawal. At that time the surgery team felt the patient was too debilitated to undergo an elective surgical procedure. Since discharge to home patient has not thrived. Two days after discharge he developed a cough without fever and later developed progressive abdominal pain. Because of his symptoms his wife encouraged him to follow up with his primary care doctor who referred him to the emergency room. In the emergency room the patient was found to be severely dehydrated and there was a question of a new pneumonia on his chest x-ray. Laboratory data revealed markedly elevated lipase greater than 2000 which was consistent with possible acute pancreatitis. In addition the patient has developed new focal lower extremity edema in the bilateral feet increasing to just above the ankles. Patient is also reporting significant pain in the feet. He apparently has chronic plantar surface pain and numbness. He was subsequently admitted to the step down unit for further monitoring and treatment of acute pancreatitis.  Assessment/Plan:  Pancreatitis, alcoholic, acute on chronic / Pancreatic pseudocyst/cyst *Appreciate GI and general surgery assistance-GI advises to start NG tube feeds.  *Lipase as well as total bilirubin continue to trend downward with bowel rest and hydration  Cholelithiases *Ultrasound revealed gallstones without evidence of acute cholecystitis or biliary obstruction *Once  pseudocyst resolved patient can be reevaluated by surgery for elective cholecystectomy. Hopefully this can be done in conjunction with his left inguinal hernia repair  Protein calorie malnutrition *Abd XR shows ? Early ileus *GI states OK to begin TF and does no have to be post pyloric *Insert PANDA  *Nutrition consult for TF rate/goals  ARF (acute renal failure)/Dehydration *Baseline creatinine was 0.88 on 08/06/2012. Current creatinine has slightly increased to 1.71 with a BUN of 51 after admission so suspect despite IV fluids remains inadequately hydrated *Continue high rate IV fluids  Acute delirium/? Undiagnosed Alcoholic dementia *? If precipitated by narcotics in setting of acute illness and possible underlying dementia *Supportive care  Leukocytosis *Likely reactive and secondary to acute pancreatitis with pseudocyst *Follow CBC *DC antibiotics  Acute respiratory failure with hypoxia/?  HCAP (healthcare-associated pneumonia) *Likely has multifactorial etiology:    -Mechanical alterations in ventilation from abdominal distention   -When comparing chest x-ray to CT of the abdomen, the lung views on CT appear more consistent with a reactive pleural effusion secondary to the very large pseudocyst *Currently on broad-spectrum antibiotics-dc to avoid C. Diff colitis and monitor for worsening resp sx's   HTN (hypertension) *Actually has a relative hypotension related to dehydration *Home lisinopril remains on hold  Alcohol abuse *Underwent complete detoxification during last admission *Wife confirms patient had only one alcoholic beverage after discharge to home *Patient counseled against further alcohol ingestion in the setting of chronic calcific pancreatitis with acute pseudocyst  Hyperkalemia *Resolved *Follow electrolytes  Lower extremity pain, bilateral/ Candidal dermatitis *Unclear etiology but suspect is neuropathic in nature *Pulses are easily palpable and the feet  are warm *B12 level is normal but we'll check a folate level *Also has skin changes consistent with candida dermatitis so we'll begin topical  treatment *Supportive care with pain medications-use fentanyl since blood pressure suboptimal  Tobacco abuse  Hyponatremia *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 Continues to awaken from sleep and yell out during our exam with complaints of neck and bilateral foot pain.   Objective: Blood pressure 102/77, pulse 108, temperature 97.7 F (36.5 C), temperature source Oral, resp. rate 19, height 5\' 8"  (1.727 m), weight 78.5 kg (173 lb 1 oz), SpO2 98.00%.  Intake/Output Summary (Last 24 hours) at 08/12/12 1154 Last data filed at 08/12/12 1100  Gross per 24 hour  Intake 1040.5 ml  Output    700 ml  Net  340.5 ml     Exam: General: No acute respiratory distress - easily agitated secondary to pain Lungs: Clear to auscultation bilaterally without wheezes but diminished in the bases bilaterally with crackles in the right base Cardiovascular: Regular rate and rhythm without murmur gallop or rub normal S1 and S2, low systolic blood pressure, IV fluid infusing at 150 cc per hour, has focal lower extremity edema bilaterally from just above the ankles to the toes, peripheral pulses are palpable Abdomen: Nontender, nondistended, soft, bowel sounds positive, no rebound, no ascites, no appreciable mass Musculoskeletal: No significant cyanosis, clubbing of bilateral lower extremities Neurological: Patient is  arousable and once aroused is quite alert but drifts off to sleep easily, seems quite agitated with expressed foot pain, moves all extremities x4 but is weak estimated to be 4/5, exam nonfocal Skin: Skin is normal except for erythema in a pattern on both lower extremities consistent with contact from previous socks-the erythema becomes more scattered on the feet that appears consistent with fungus  Data Reviewed: Basic Metabolic Panel:  Lab 08/12/12 1610 08/11/12 0425 08/14/2012 1845 08/06/2012 1333 08/06/12 0515  NA 131* 130* -- 128* 134*  K 4.9 4.2 -- 5.3* 3.2*  CL 98 96 -- 93* 98  CO2 12* 17* -- 18* 21  GLUCOSE 139* 110* -- 140* 106*  BUN 46* 51* -- 49* 21  CREATININE 1.40* 1.71* 1.70* 1.64* 0.88  CALCIUM 7.7* 7.5* -- 9.1 8.6  MG 2.6* -- -- -- --  PHOS 4.5 -- -- -- --   Liver Function Tests:  Lab 08/12/12 0510 08/11/12 0425 08/26/2012 1333  AST 77* 71* 96*  ALT 39 44 60*  ALKPHOS 125* 109 133*  BILITOT 2.4* 2.3* 2.9*  PROT 5.9* 5.7* 7.0  ALBUMIN 1.4* 1.6* 1.9*    Lab 08/12/12 0510 08/11/12 0425 08/28/2012 1333  LIPASE 1191* 1448* 2151*  AMYLASE -- -- --   CBC:  Lab 08/12/12 0510 08/11/12 0425 08/15/2012 1845 08/09/2012 1319 08/06/12 0515  WBC 17.2* 23.0* 18.6* 24.6* 13.9*  NEUTROABS -- -- -- 22.6* --  HGB 13.9 13.5 13.5 16.6 15.6  HCT 39.9 37.3* 38.4* 45.7 42.6  MCV 103.9* 99.7 101.1* 101.6* 99.8  PLT 243 278 235 273 181   CBG:  Lab 08/12/12 0713 08/12/12 0215 08/11/12 0811 08/11/12 0634 08/11/12 0414  GLUCAP 159* 136* 121* 132* 124*    Recent Results (from the past 240 hour(s))  URINE CULTURE     Status: Normal   Collection Time  08/03/12 11:19 AM      Component Value Range Status Comment   Specimen Description URINE, CLEAN CATCH   Final    Special Requests NONE   Final    Culture  Setup Time 08/04/2012 01:55   Final    Colony Count NO GROWTH   Final    Culture NO GROWTH   Final    Report Status 08/04/2012 FINAL   Final   CULTURE, BLOOD (ROUTINE X 2)     Status:  Normal   Collection Time   08/03/12  1:00 PM      Component Value Range Status Comment   Specimen Description Blood   Final    Special Requests NONE   Final    Culture  Setup Time 08/03/2012 15:36   Final    Culture NO GROWTH 5 DAYS   Final    Report Status 08/09/2012 FINAL   Final   CULTURE, BLOOD (ROUTINE X 2)     Status: Normal   Collection Time   08/03/12  1:05 PM      Component Value Range Status Comment   Specimen Description Blood   Final    Special Requests NONE   Final    Culture  Setup Time 08/03/2012 15:36   Final    Culture NO GROWTH 5 DAYS   Final    Report Status 08/09/2012 FINAL   Final   CULTURE, BLOOD (ROUTINE X 2)     Status: Normal (Preliminary result)   Collection Time   2012/08/12  3:41 PM      Component Value Range Status Comment   Specimen Description BLOOD HAND RIGHT   Final    Special Requests BOTTLES DRAWN AEROBIC ONLY 5CC   Final    Culture  Setup Time 08/12/2012 22:36   Final    Culture     Final    Value:        BLOOD CULTURE RECEIVED NO GROWTH TO DATE CULTURE WILL BE HELD FOR 5 DAYS BEFORE ISSUING A FINAL NEGATIVE REPORT   Report Status PENDING   Incomplete   CULTURE, BLOOD (ROUTINE X 2)     Status: Normal (Preliminary result)   Collection Time   August 12, 2012  3:50 PM      Component Value Range Status Comment   Specimen Description BLOOD HAND LEFT   Final    Special Requests BOTTLES DRAWN AEROBIC ONLY 6CC   Final    Culture  Setup Time August 12, 2012 22:37   Final    Culture     Final    Value:        BLOOD CULTURE RECEIVED NO GROWTH TO DATE CULTURE WILL BE HELD FOR 5 DAYS BEFORE ISSUING A FINAL NEGATIVE REPORT   Report Status PENDING   Incomplete   URINE CULTURE     Status: Normal   Collection Time   08/11/12 12:43 PM      Component Value Range Status Comment   Specimen Description URINE, RANDOM   Final    Special Requests NONE   Final    Culture  Setup Time 08/11/2012 14:45   Final    Colony Count NO GROWTH   Final    Culture NO GROWTH   Final     Report Status 08/12/2012 FINAL   Final      Studies:  Recent x-ray studies have been reviewed in detail by the Attending Physician  Scheduled Meds:  Reviewed in detail by the Attending Physician   Calvert Cantor, MD  [phone]  If 7PM-7AM, please contact night-coverage [email or web address] Password [email or web address] 08/12/2012, 11:54 AM   LOS: 2 days

## 2012-08-12 NOTE — Progress Notes (Signed)
Urine output minimal (175 in shift). MD Rizwan notified. Bladder scan ordered. Will cont to monitor pt. Gary [name] L

## 2012-08-12 NOTE — Progress Notes (Addendum)
Pt bladder scan showed > 600 cc urine. T. Callahan,NP notified. New orders received to In and Out Cath. Will continue to monitor patient.

## 2012-08-12 NOTE — Progress Notes (Signed)
During shift change, Marcelino Duster ,RN noted pt to be confused. On assessment Pt AxO x 4. 2100 Pt disoriented to situation. Inattention and difficulty focusing. Initiated Delirium Plan of Care. Kirby,NP made aware. No new orders received.

## 2012-08-12 NOTE — Evaluation (Signed)
Occupational Therapy Evaluation Patient Details Name: Gary [name] MRN: [id number] DOB: [birth year] Today's Date: 08/12/2012 Time: 9562-1308 OT Time Calculation (min): 22 min  OT Assessment / Plan / Recommendation Clinical Impression  This 66 y.o. male admitted with with Pancreatitis, PNA, Inguinal Hernia, and history of Etoh.  Pt. very confused at time of eval with very limited participation.  Pt. will benefit from acute OT to address the below listed deficits.  Pt. will likely require SNF level rehab at discharge    OT Assessment  Patient needs continued OT Services    Follow Up Recommendations  Supervision/Assistance - 24 hour;SNF    Barriers to Discharge Decreased caregiver support    Equipment Recommendations  None recommended by PT;None recommended by OT    Recommendations for Other Services    Frequency  Min 2X/week    Precautions / Restrictions Precautions Precautions: Fall Restrictions Weight Bearing Restrictions: No       ADL  Eating/Feeding: NPO Grooming: Wash/dry hands;Wash/dry face;Teeth care;+1 Total assistance Where Assessed - Grooming: Supine, head of bed up;Unsupported sitting Upper Body Bathing: +1 Total assistance Where Assessed - Upper Body Bathing: Supine, head of bed up Lower Body Bathing: +1 Total assistance Where Assessed - Lower Body Bathing: Supine, head of bed up;Rolling right and/or left Upper Body Dressing: +1 Total assistance Where Assessed - Upper Body Dressing: Supine, head of bed up Lower Body Dressing: +1 Total assistance Where Assessed - Lower Body Dressing: Supine, head of bed up;Rolling right and/or left Toilet Transfer: +1 Total assistance (Pt. unable) Toileting - Clothing Manipulation and Hygiene: +1 Total assistance Where Assessed - Toileting Clothing Manipulation and Hygiene: Rolling right and/or left Tub/Shower Transfer: +1 Total assistance (pt. unable) Transfers/Ambulation Related to ADLs: Pt. unable.  Only tolerated EOB  sitting ADL Comments: Pt. with bil. mittens on hands to prevent pulling at lines and tubes.  Pt.  very confused and unsafe to remove mitts.  Pt. with c/o Bil. LE pain - bil. ankles and feet red and swollen    OT Diagnosis: Generalized weakness;Acute pain;Cognitive deficits  OT Problem List: Decreased strength;Decreased activity tolerance;Impaired balance (sitting and/or standing);Decreased cognition;Decreased safety awareness;Decreased knowledge of use of DME or AE;Pain OT Treatment Interventions: Self-care/ADL training;DME and/or AE instruction;Therapeutic activities;Patient/family education;Balance training   OT Goals Acute Rehab OT Goals OT Goal Formulation: Patient unable to participate in goal setting Time For Goal Achievement: 08/26/12 Potential to Achieve Goals: Good ADL Goals Pt Will Perform Grooming: with min assist;Sitting, edge of bed ADL Goal: Grooming - Progress: Goal set today Pt Will Perform Upper Body Bathing: with min assist;Sitting, edge of bed;Sitting, chair ADL Goal: Upper Body Bathing - Progress: Goal set today Pt Will Transfer to Toilet: with mod assist;with DME ADL Goal: Toilet Transfer - Progress: Goal set today Pt Will Perform Toileting - Clothing Manipulation: with mod assist;Standing ADL Goal: Toileting - Clothing Manipulation - Progress: Goal set today Additional ADL Goal #1: Pt. will sustain attention to familiar ADL tasks x 2 mins with no more than one cue ADL Goal: Additional Goal #1 - Progress: Goal set today  Visit Information  Last OT Received On: 08/12/12 Assistance Needed: +1    Subjective Data  Subjective: Put on my gown and let me go home (pt already in a gown Patient Stated Goal: Pt unable    Prior Functioning     Home Living Lives With: Spouse Available Help at Discharge: Family;Skilled Nursing Facility (Per chart, SNF) Type of Home: House Home Access: Stairs to enter [employer]  of Steps: 3-4 Entrance Stairs-Rails:  Right Home Layout: Two level;Able to live on main level with bedroom/bathroom Home Adaptive Equipment: Walker - rolling Prior Function Level of Independence: Independent Able to Take Stairs?: Yes Driving: Yes Vocation: Retired Comments: Worked at [employer] Communication:  (difficult to understand)         Vision/Perception Vision - Assessment Vision Assessment: Vision not tested Additional Comments: Pt. unable to participate   Cognition  Overall Cognitive Status: Impaired Area of Impairment: Attention;Following commands Arousal/Alertness: Lethargic Orientation Level: Disoriented to;Place;Time;Situation Behavior During Session: Lethargic Current Attention Level: Focused Following Commands: Follows one step commands with increased time Cognition - Other Comments: Difficult to accurately asses cognition due to confusion    Extremity/Trunk Assessment Right Upper Extremity Assessment RUE ROM/Strength/Tone: Within functional levels Left Upper Extremity Assessment LUE ROM/Strength/Tone: Within functional levels     Mobility Bed Mobility Bed Mobility: Supine to Sit;Sitting - Scoot to Edge of Bed;Sit to Supine Supine to Sit: 3: Mod assist;With rails Sitting - Scoot to Edge of Bed: 2: Max assist Sit to Supine: 2: Max assist Details for Bed Mobility Assistance:  Pt. Requires step by step instruction and physical assist for LEs and UB     Shoulder Instructions     Exercise     Balance Balance Balance Assessed: Yes Static Sitting Balance Static Sitting - Balance Support: Bilateral upper extremity supported Static Sitting - Level of Assistance: 4: Min assist Static Sitting - Comment/# of Minutes: 4 mins   End of Session OT - End of Session Activity Tolerance: Patient limited by fatigue;Patient limited by pain Patient left: in bed;with call bell/phone within reach  GO     Tristy Udovich M 08/12/2012, 10:02 AM

## 2012-08-12 NOTE — Progress Notes (Signed)
Clinical Social Work  CSW attempted to meet with patient at bedside. Patient sleeping and unable to be aroused to participate in session. No family present. CSW attempted to call wife but no response. CSW will continue to follow to complete SBIRT and discuss SNF placement.  [place], [place] [phone]

## 2012-08-12 NOTE — Progress Notes (Addendum)
Upon assessment new finding of two fluid filled blisters on RLE. Also, in report day nurse Meredith,RN notified me of 175 cc total urine output per shift. Notified T.Callahan,NP. Verbal order to do bladder scan in four hours.

## 2012-08-12 NOTE — Progress Notes (Signed)
Subjective: delirious  Objective: Vital signs in last 24 hours: Temp:  [96.9 F (36.1 C)-98.4 F (36.9 C)] 97.7 F (36.5 C) (11/14 0433) Pulse Rate:  [98-121] 117  (11/14 0400) Resp:  [14-23] 20  (11/14 0500) BP: (89-130)/(55-116) 101/62 mmHg (11/14 0500) SpO2:  [88 %-100 %] 100 % (11/14 0400) Weight:  [173 lb 1 oz (78.5 kg)] 173 lb 1 oz (78.5 kg) (11/14 0406) Last BM Date: 08/09/12  Intake/Output from previous day: 11/13 0701 - 11/14 0700 In: 253 [I.V.:3; IV Piggyback:250] Out: 700 [Urine:700] Intake/Output this shift:    GI: mildly distended, nontender, lih present reduced again today, really nontender today  Lab Results:   [facility] 08/12/12 0510 08/11/12 0425  WBC 17.2* 23.0*  HGB 13.9 13.5  HCT 39.9 37.3*  PLT 243 278   BMET  Basename 08/12/12 0510 08/11/12 0425  NA 131* 130*  K 4.9 4.2  CL 98 96  CO2 12* 17*  GLUCOSE 139* 110*  BUN 46* 51*  CREATININE 1.40* 1.71*  CALCIUM 7.7* 7.5*   PT/INR  Basename 08/11/12 0425 05-Sep-2012 1845  LABPROT 19.0* 18.2*  INR 1.65* 1.56*   ABG No results found for this basename: PHART:2,PCO2:2,PO2:2,HCO3:2 in the last 72 hours  Studies/Results: Ct Abdomen Pelvis Wo Contrast  09-05-2012  *RADIOLOGY REPORT*  Clinical Data: Abdominal pain, weakness, abdominal distention and pancreatitis.  CT ABDOMEN AND PELVIS WITHOUT CONTRAST  Technique:  Multidetector CT imaging of the abdomen and pelvis was performed following the standard protocol without intravenous contrast.  Comparison: CT of the abdomen and pelvis dated 08/03/2012 and abdominal ultrasound earlier today.  Findings: Visualized lung bases show a small right pleural effusion and atelectasis of the right lower lobe.  Consistent with ultrasound findings, there is significant enlargement of loculated perihepatic fluid collections which may be subcapsular and are exerting significant mass effect on the liver. Adjacent peripancreatic fluid collections are also identified  extending into the porta hepatis and findings are most likely related to enlarging loculated pseudocysts.  There is no suggestion of abscess with internal fluid density suggesting simple fluid.  No air is identified in any of the loculated fluid collections.  Coarse calcifications in the head of the pancreas are again identified consistent with chronic pancreatitis.  No significant peripancreatic inflammatory changes are identified.  The gallbladder contains high density material consistent with gallstones and sludge identified by ultrasound.  There is no evidence of biliary ductal dilatation.  The spleen is normal in size.  The kidneys are unremarkable.  There is no evidence of bowel obstruction. A small amount of ascites is present in the pelvis.  There is stranding and nodularity present in the mesentery and omentum which is nonspecific, and may simply relate to edema and prominent vessels.  A component of potential carcinomatosis cannot be entirely excluded, especially given more normal appearance of the mesenteric and omental fat on a prior CT in 2012. No discrete masses or enlarged lymph nodes are identified. There is a stable left inguinal hernia.  No evidence of incarcerated bowel.  The bladder is unremarkable.  IMPRESSION:  1.  Significant enlargement of peripancreatic fluid collections which appear at least partially subcapsular and exert significant mass effect on both right and left lobes of the liver.  Given history of pancreatitis and appearance of the prior CT 7 days ago, these are suggestive of enlarging pseudocysts. 2.  Nonspecific nodularity and edema within the mesentery and omentum without discrete focal mass.  This may simply relate to edema and hypoproteinemia.  However, follow-up would be indicated to exclude any progressive evidence of carcinomatosis.  3.  Stable left inguinal hernia.   Original Report Authenticated By: Irish Lack, M.D.    US Abdomen Complete  08/17/2012  *RADIOLOGY  REPORT*  Clinical Data:  Pancreatitis.  ABDOMEN ULTRASOUND  Technique:  Complete abdominal ultrasound examination was performed including evaluation of the liver, gallbladder, bile ducts, pancreas, kidneys, spleen, IVC, and abdominal aorta.  Comparison:  CT of the abdomen dated 08/03/2012  Findings:  Gallbladder:  The gallbladder is contracted and contains echogenic material, likely representing a combination of gallstones and biliary sludge.  There is no associated gallbladder wall thickening, sonographic Murphy's sign or focal pericholecystic fluid.  Common Bile Duct:  Normal caliber of 4 mm.  Liver:  The liver is echogenic and shows characteristics of potential early cirrhosis without focal mass.  IVC:  Patent throughout its visualized course in the abdomen.  Pancreas:  There is limited visualization of the pancreas. Pancreatic ductal caliber of 3 mm is mildly prominent in the head of the pancreas.  Spleen:  The spleen is of normal echotexture and size.  Kidneys:  Both kidneys measure approximately 12 cm and are unremarkable by ultrasound, demonstrating no evidence of hydronephrosis, significant atrophy or focal lesions.  Abdominal Aorta:  Normal caliber abdominal aorta.  Moderate amount of ascites is present, predominately surrounding the liver.  Overall volume appears somewhat more prominent compared to the recent CT study one week ago.  Fluid may be partially loculated and does appear to be exerting some mass effect on the capsule of the liver.  Part of this fluid could also conceivably represent evolving pseudocyst formation given the history pancreatitis and evidence of chronic pancreatitis by prior CT.  IMPRESSION:  1.  The gallbladder contains echogenic material, likely a combination of gallstones and echogenic sludge. No evidence to suggest acute cholecystitis or biliary obstruction. 2.  Probable increase in ascites compared to the CT examination 7 days ago.  Fluid visualized by ultrasound is  predominately around the liver and does appear to be exerting some mass effect on the capsule of the liver.  Given appearance, this could also potentially represent evolving pseudocyst formation given the presence of pancreatitis.   Original Report Authenticated By: Irish Lack, M.D.    Dg Chest Port 1 View  08/07/2012  *RADIOLOGY REPORT*  Clinical Data: Left-sided chest pain and shortness of breath.  PORTABLE CHEST - 1 VIEW  Comparison: 08/05/2012  Findings: There is increase in atelectasis at the right lung base with associated volume loss and elevation of the right hemidiaphragm.  Opacity at the medial left lung base present which is felt to most likely represent atelectasis.  However, underlying infiltrate or even pulmonary nodule is not completely excluded. Recommend follow-up PA and lateral chest films at a later time.  IMPRESSION: Worsening right basilar atelectasis with associated volume loss. Medial left lower lung opacity, likely atelectasis.  Follow-up is recommended as above.   Original Report Authenticated By: Irish Lack, M.D.     Anti-infectives: Anti-infectives     Start     Dose/Rate Route Frequency Ordered Stop   08/12/12 1700   levofloxacin (LEVAQUIN) IVPB 750 mg        750 mg 100 mL/hr over 90 Minutes Intravenous Every 48 hours 08/11/2012 1749     08/12/12 0600   vancomycin (VANCOCIN) IVPB 1000 mg/200 mL premix        1,000 mg 200 mL/hr over 60 Minutes Intravenous Every 24 hours 08/11/12 1141  Sep 05, 2012 2200   piperacillin-tazobactam (ZOSYN) IVPB 3.375 g        3.375 g 12.5 mL/hr over 240 Minutes Intravenous Every 8 hours September 05, 2012 1749     2012/09/05 1800   vancomycin (VANCOCIN) 750 mg in sodium chloride 0.9 % 150 mL IVPB  Status:  Discontinued        750 mg 150 mL/hr over 60 Minutes Intravenous Every 12 hours 2012/09/05 1749 08/11/12 1141   2012/09/05 1530   vancomycin (VANCOCIN) IVPB 1000 mg/200 mL premix  Status:  Discontinued        1,000 mg 200 mL/hr over 60  Minutes Intravenous  Once 09-05-2012 1528 05-Sep-2012 1808   Sep 05, 2012 1530   piperacillin-tazobactam (ZOSYN) IVPB 3.375 g        3.375 g 100 mL/hr over 30 Minutes Intravenous  Once 09-05-2012 1528 Sep 05, 2012 1630   09/05/2012 1530   levofloxacin (LEVAQUIN) IVPB 750 mg  Status:  Discontinued        750 mg 100 mL/hr over 90 Minutes Intravenous  Once 09-05-12 1528 2012-09-05 1808          Assessment/Plan: Pancreatitis, has gallstones LI[facility]  He will need both of these addressed operatively once resolves.    [facility] 08/12/2012

## 2012-08-12 NOTE — Progress Notes (Addendum)
INITIAL ADULT NUTRITION ASSESSMENT Date: 08/12/2012   Time: 2:49 PM  INTERVENTION:  Initiate Vital AF 1.2 formula at 20 ml/hr and increase by 10 ml every 8 hours to goal rate of 70 ml/hr to provide 2016 kcals, 125 gm protein, 1362 ml of free water  Monitor Mg, Phos and K+ levels  RD to follow for nutrition care plan  DOCUMENTATION CODES Per approved criteria  -Severe malnutrition in the context of acute illness or injury   Reason for Assessment: Consult  ASSESSMENT: Male 66 y.o.  Dx: Pancreatitis, alcoholic, acute  Hx:  Past Medical History  Diagnosis Date  . Hypertension   . GERD (gastroesophageal reflux disease)   . Pneumonia     "5-7 years ago" (08/03/2012)  . Exertional dyspnea     "just recently" (08/03/2012)  . Delirium, acute 08/03/2012  . Prostate cancer     Related Meds:     . albuterol  2.5 mg Nebulization Q6H  . docusate sodium  100 mg Oral BID  . enoxaparin (LOVENOX) injection  40 mg Subcutaneous Q24H  . ipratropium  0.5 mg Nebulization Q6H  . ketoconazole   Topical BID  . sodium chloride  3 mL Intravenous Q12H  . [DISCONTINUED] levofloxacin (LEVAQUIN) IV  750 mg Intravenous Q48H  . [DISCONTINUED] piperacillin-tazobactam (ZOSYN)  IV  3.375 g Intravenous Q8H  . [DISCONTINUED] vancomycin  1,000 mg Intravenous Q24H    Ht: 5\' 8"  (172.7 cm)  Wt: 173 lb 1 oz (78.5 kg)   Wt Readings from Last 10 Encounters:  08/12/12 173 lb 1 oz (78.5 kg)  08/07/12 165 lb 12.6 oz (75.2 kg)  08/07/12 165 lb 12.6 oz (75.2 kg)  04/08/12 158 lb 4 oz (71.782 kg)  03/01/12 157 lb 12.8 oz (71.578 kg)  03/01/12 157 lb 12.8 oz (71.578 kg)  12/24/11 162 lb 9.6 oz (73.755 kg)  11/28/11 162 lb 3.2 oz (73.573 kg)  10/08/11 167 lb 6 oz (75.921 kg)    Ideal Wt: 70 kg % Ideal Wt: 112%  Usual Wt: 72 kg % Usual Wt: 97%  Body mass index is 26.31 kg/(m^2).  Food/Nutrition Related Hx: no triggers per admission nutrition screen  Labs:  CMP     Component Value Date/Time   NA  131* 08/12/2012 0510   K 4.9 08/12/2012 0510   CL 98 08/12/2012 0510   CO2 12* 08/12/2012 0510   GLUCOSE 139* 08/12/2012 0510   BUN 46* 08/12/2012 0510   CREATININE 1.40* 08/12/2012 0510   CALCIUM 7.7* 08/12/2012 0510   PROT 5.9* 08/12/2012 0510   ALBUMIN 1.4* 08/12/2012 0510   AST 77* 08/12/2012 0510   ALT 39 08/12/2012 0510   ALKPHOS 125* 08/12/2012 0510   BILITOT 2.4* 08/12/2012 0510   GFRNONAA 50* 08/12/2012 0510   GFRAA 59* 08/12/2012 0510    Phosphorus  Date Value Range Status  08/12/2012 4.5  2.3 - 4.6 mg/dL Final    Magnesium  Date Value Range Status  08/12/2012 2.6* 1.5 - 2.5 mg/dL Final     Intake/Output Summary (Last 24 hours) at 08/12/12 1450 Last data filed at 08/12/12 1300  Gross per 24 hour  Intake 1640.5 ml  Output    425 ml  Net 1215.5 ml    CBG (last 3)   Basename 08/12/12 1238 08/12/12 1114 08/12/12 0713  GLUCAP 134* 126* 159*    Diet Order: NPO  Supplements/Tube Feeding: N/A  IVF:    sodium chloride Last Rate: 200 mL/hr at 08/12/12 1144  Estimated Nutritional Needs:   Kcal: 2000-2200 Protein: 115-130 gm Fluid: 2.0-2.2 L  Patient admitted from his primary care MD's office after found to be dehydrated, suspected new pneumonia on chest X-ray; also found to have laboratory data consistent with acute pancreatitis.  GI note reviewed 11/14; RD spoke to patient's family -- reports he has not "eaten anything" in over 1 week; unsure if patient has lost weight; NGT placed per IR -- report unavailable at present; RD consulted for EN initiation & management.  Patient meets criteria for severe malnutrition in the context of acute illness due to < 50% intake of estimated energy requirement for > 5 days and moderate fluid accumulation to lower extremities.  Patient is at risk for refeeding syndrome with EN initiation given malnutrition diagnosis.  NUTRITION DIAGNOSIS: -Inadequate oral intake (NI-2.1).  Status: Ongoing  RELATED TO: acute on  chronic pancreatitis  AS EVIDENCE BY: NPO status  MONITORING/EVALUATION(Goals): Goal: EN to meet >/= 90% of estimated nutrition needs Monitor: EN regimen & tolerance, weight, labs, I/O's  EDUCATION NEEDS: -No education needs identified at this time  Kirkland Hun, RD, LDN Pager #: [phone] After-Hours Pager #: [phone]

## 2012-08-12 NOTE — Progress Notes (Signed)
[name] 08/12/2012, 10:38 AM  SUBJECTIVE:       Note tachycardia. Note that recorded urine for prior 24 hours was only 700 cc. No abdominal pain, just pain in lower legs No nausea.  No BMs.  No analgesics since Fentanyl at 0400 today. Confused, saying " Milk, milk".  When questioned says "I have a fetish for milk" .   OBJECTIVE:         Vital signs in last 24 hours:    Temp:  [96.9 F (36.1 C)-98.4 F (36.9 C)] 97.7 F (36.5 C) (11/14 0816) Pulse Rate:  [98-121] 108  (11/14 0816) Resp:  [14-23] 19  (11/14 0816) BP: (89-130)/(55-116) 102/77 mmHg (11/14 0816) SpO2:  [88 %-100 %] 98 % (11/14 0940) Weight:  [78.5 kg (173 lb 1 oz)] 78.5 kg (173 lb 1 oz) (11/14 0406) Last BM Date: 08/09/12 General: Acutely ill looking, diaphoretic, dishevelled.  Heart: tachy, regular Chest: clear but reduced BS Abdomen: ND, soft, active, non-tinkling BS.  Extremities: persistent B LE edema but less redness.  Neuro/Psych:  Confused, not fully oriented but lucent at times and follows commands for me.   Intake/Output from previous day: 11/13 0701 - 11/14 0700 In: 415.5 [I.V.:153; IV Piggyback:262.5] Out: 700 [Urine:700]  Intake/Output this shift: Total I/O In: 325 [I.V.:300; IV Piggyback:25] Out: 125 [Urine:125]  Lab Results:  [facility] 08/12/12 0510 08/11/12 0425 08-25-2012 1845  WBC 17.2* 23.0* 18.6*  HGB 13.9 13.5 13.5  HCT 39.9 37.3* 38.4*  PLT 243 278 235   BMET  Basename 08/12/12 0510 08/11/12 0425 2012/08/25 1845 25-Aug-2012 1333  NA 131* 130* -- 128*  K 4.9 4.2 -- 5.3*  CL 98 96 -- 93*  CO2 12* 17* -- 18*  GLUCOSE 139* 110* -- 140*  BUN 46* 51* -- 49*  CREATININE 1.40* 1.71* 1.70* --  CALCIUM 7.7* 7.5* -- 9.1   LFT  Basename 08/12/12 0510 08/11/12 0425 August 25, 2012 1333  PROT 5.9* 5.7* 7.0  ALBUMIN 1.4* 1.6* 1.9*  AST 77* 71* 96*  ALT 39 44 60*  ALKPHOS 125* 109 133*  BILITOT 2.4* 2.3* 2.9*  BILIDIR -- -- --  IBILI -- -- --   PT/INR  Basename  08/11/12 0425 08/25/12 1845  LABPROT 19.0* 18.2*  INR 1.65* 1.56*   Hepatitis Panel No results found for this basename: HEPBSAG,HCVAB,HEPAIGM,HEPBIGM in the last 72 hours  Studies/Results: Ct Abdomen Pelvis Wo Contrast 08-25-12    IMPRESSION:  1.  Significant enlargement of peripancreatic fluid collections which appear at least partially subcapsular and exert significant mass effect on both right and left lobes of the liver.  Given history of pancreatitis and appearance of the prior CT 7 days ago, these are suggestive of enlarging pseudocysts. 2.  Nonspecific nodularity and edema within the mesentery and omentum without discrete focal mass.  This may simply relate to edema and hypoproteinemia.  However, follow-up would be indicated to exclude any progressive evidence of carcinomatosis.  3.  Stable left inguinal hernia.   Original Report Authenticated By: Irish Lack, M.D.    US Abdomen Complete 08-25-2012   IMPRESSION:  1.  The gallbladder contains echogenic material, likely a combination of gallstones and echogenic sludge. No evidence to suggest acute cholecystitis or biliary obstruction. 2.  Probable increase in ascites compared to the CT examination 7 days ago.  Fluid visualized by ultrasound is predominately around the liver and does appear to be exerting some mass effect on the capsule of the liver.  Given appearance, this could also potentially represent evolving pseudocyst formation given the presence of pancreatitis.   Original Report Authenticated By: Irish Lack, M.D.    Dg Chest Port 1 View 08/12/2012  .  IMPRESSION: Worsening right basilar atelectasis with associated volume loss. Medial left lower lung opacity, likely atelectasis.  Follow-up is recommended as above.   Original Report Authenticated By: Irish Lack, M.D.     ASSESMENT: * Acute on chronic pancreatitis.  Biliary vs etoh related.  Suspect the later is cause.   Has GB sludge and stones on CT but CBD not diated.  AST and Alk Phos elevated but also suspect element of alcoholic hepatitis. Lap chole planned when pancreatitis resolved.  Likely to be done in coordination with left inguinal herniorrhaphy.  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 No evidence that this is infected. No fever.  WBCs are often elevated in setting of acute pancreatitis.  Fluids returned to 150 ml/hour.  NPO.  Still on Levaquin, Zosyn and Vanco.  * Alcoholism. Acute withdrawal last week during admission. Encephalopathy currently, narcotics may be contributing.  * Renal insufficiency, dehydration likely.  BUN/Creat improved.   *  Hyponatremia. Improved, not resolved.  *  LE edema.  * Left inguinal hernia.  Surgery had been planned for some time in future, but date had not been set.  * Right lung atx.  * Recent dental extractions x 4.  * Hyperglycemia, no hx DM  * Protein malnutrition, low albumin. Peripheral edema is likely from this and from 3rd spacing of fluid.  * Coagulopathy, mild. Platelets normal.  * Resp insufficiency, treated for bronchitis 2 weeks ago. No fever. ATX on chest films.     PLAN: *  Stop Vanco,Zosyn and levaquin. Discussed with hospitalist.  *  Before using TNA would trial post pyloric or gastric tube feedings, the mass effect is on liver, not the stomach or intestine.  D/w hospitalist and wait on the 2 view abdominal  X ray reading before finalizing nutrtion decisions.  * I am going to increase fluids to 200 ml per hour.     LOS: 2 days   Gary [name]  08/12/2012, 10:38 AM Pager: [phone]   Addendum of 2 PM 2 view Abdominal films: Nonspecific bowel gas pattern without plain film evidence of bowel  obstruction or free intraperitoneal air.  CT noted loculated perihepatic fluid not well delineated by plain  film exam.  Pt is ok to begin feeding either po or via tube, note dietary/nutrition consult was ordered.   GI ATTENDING  Interval laboratories and x-rays reviewed. Patient seen and  examined. Somnolent this afternoon and having PICC line placed. It's not clear to me why his IV fluids were decreased after we increase them yesterday. He needs aggressive hydration. His abdomen actually feels a bit better. In addition to aggressive hydration, correction of electrolytes, alcohol withdrawal precautions, and supportive care, I agree with starting enteral feeding. Please elevate the head of the bed 45 while feeding. Repeat laboratory studies tomorrow.  [name]. [name]., M.D. [facility] Division of Gastroenterology

## 2012-08-13 ENCOUNTER — Inpatient Hospital Stay (HOSPITAL_COMMUNITY): Payer: Medicare Other

## 2012-08-13 DIAGNOSIS — J189 Pneumonia, unspecified organism: Secondary | ICD-10-CM

## 2012-08-13 DIAGNOSIS — M7989 Other specified soft tissue disorders: Secondary | ICD-10-CM

## 2012-08-13 DIAGNOSIS — F1010 Alcohol abuse, uncomplicated: Secondary | ICD-10-CM

## 2012-08-13 DIAGNOSIS — D72829 Elevated white blood cell count, unspecified: Secondary | ICD-10-CM

## 2012-08-13 DIAGNOSIS — M79609 Pain in unspecified limb: Secondary | ICD-10-CM

## 2012-08-13 LAB — BLOOD GAS, ARTERIAL
Acid-base deficit: 8.6 mmol/L — ABNORMAL HIGH (ref 0.0–2.0)
O2 Saturation: 90.1 %
TCO2: 15.8 mmol/L (ref 0–100)
pCO2 arterial: 23.9 mmHg — ABNORMAL LOW (ref 35.0–45.0)

## 2012-08-13 LAB — BASIC METABOLIC PANEL
BUN: 52 mg/dL — ABNORMAL HIGH (ref 6–23)
Calcium: 8.2 mg/dL — ABNORMAL LOW (ref 8.4–10.5)
Calcium: 6.8 mg/dL — ABNORMAL LOW (ref 8.4–10.5)
GFR calc Af Amer: 62 mL/min — ABNORMAL LOW (ref >90)
GFR calc non Af Amer: 48 mL/min — ABNORMAL LOW (ref >90)
GFR calc non Af Amer: 53 mL/min — ABNORMAL LOW (ref >90)
Glucose, Bld: 140 mg/dL — ABNORMAL HIGH (ref 70–99)
Potassium: 3 mEq/L — ABNORMAL LOW (ref 3.5–5.1)
Sodium: 139 mEq/L (ref 135–145)

## 2012-08-13 LAB — COMPREHENSIVE METABOLIC PANEL
AST: 70 U/L — ABNORMAL HIGH (ref 0–37)
Albumin: 1.5 g/dL — ABNORMAL LOW (ref 3.5–5.2)
Calcium: 8 mg/dL — ABNORMAL LOW (ref 8.4–10.5)
Creatinine, Ser: 1.46 mg/dL — ABNORMAL HIGH (ref 0.50–1.35)
Total Protein: 6 g/dL (ref 6.0–8.3)

## 2012-08-13 LAB — PROCALCITONIN: Procalcitonin: 3.86 ng/mL

## 2012-08-13 LAB — GLUCOSE, CAPILLARY
Glucose-Capillary: 128 mg/dL — ABNORMAL HIGH (ref 70–99)
Glucose-Capillary: 115 mg/dL — ABNORMAL HIGH (ref 70–99)
Glucose-Capillary: 149 mg/dL — ABNORMAL HIGH (ref 70–99)

## 2012-08-13 LAB — CBC
Hemoglobin: 12.3 g/dL — ABNORMAL LOW (ref 13.0–17.0)
MCH: 35.4 pg — ABNORMAL HIGH (ref 26.0–34.0)
MCV: 101.2 fL — ABNORMAL HIGH (ref 78.0–100.0)
RBC: 3.47 MIL/uL — ABNORMAL LOW (ref 4.22–5.81)

## 2012-08-13 LAB — PROTIME-INR: Prothrombin Time: 19.6 seconds — ABNORMAL HIGH (ref 11.6–15.2)

## 2012-08-13 LAB — LACTATE DEHYDROGENASE: LDH: 346 U/L — ABNORMAL HIGH (ref 94–250)

## 2012-08-13 MED ORDER — SODIUM CHLORIDE 0.9 % IV SOLN
750.0000 mL | INTRAVENOUS | Status: DC | PRN
  Administered 2012-08-13 (×2): 750 mL via INTRAVENOUS

## 2012-08-13 MED ORDER — PANTOPRAZOLE SODIUM 40 MG IV SOLR
40.0000 mg | INTRAVENOUS | Status: DC
  Administered 2012-08-13 – 2012-08-21 (×9): 40 mg via INTRAVENOUS
  Filled 2012-08-13 (×10): qty 40

## 2012-08-13 MED ORDER — SODIUM BICARBONATE 8.4 % IV SOLN
INTRAVENOUS | Status: DC
  Administered 2012-08-13 – 2012-08-15 (×5): via INTRAVENOUS
  Filled 2012-08-13 (×15): qty 150

## 2012-08-13 MED ORDER — SODIUM CHLORIDE 0.9 % IV SOLN: INTRAVENOUS | Status: DC

## 2012-08-13 MED ORDER — MIDAZOLAM HCL 2 MG/2ML IJ SOLN
1.0000 mg | INTRAMUSCULAR | Status: DC | PRN
  Administered 2012-08-14 – 2012-08-16 (×7): 2 mg via INTRAVENOUS
  Filled 2012-08-13 (×8): qty 2

## 2012-08-13 MED ORDER — MIDAZOLAM HCL 2 MG/2ML IJ SOLN
4.0000 mg | Freq: Once | INTRAMUSCULAR | Status: AC
  Administered 2012-08-13: 4 mg via INTRAVENOUS

## 2012-08-13 MED ORDER — POTASSIUM CHLORIDE 20 MEQ/15ML (10%) PO LIQD: 40.0000 meq | Freq: Once | ORAL | Status: AC

## 2012-08-13 MED ORDER — FENTANYL CITRATE 0.05 MG/ML IJ SOLN
25.0000 ug | INTRAMUSCULAR | Status: DC | PRN
  Administered 2012-08-14 – 2012-08-15 (×4): 50 ug via INTRAVENOUS
  Administered 2012-08-15: 100 ug via INTRAVENOUS
  Administered 2012-08-15 – 2012-08-16 (×10): 50 ug via INTRAVENOUS
  Filled 2012-08-13 (×13): qty 2

## 2012-08-13 MED ORDER — IMIPENEM-CILASTATIN 500 MG IV SOLR
500.0000 mg | Freq: Three times a day (TID) | INTRAVENOUS | Status: DC
  Administered 2012-08-13 – 2012-08-15 (×6): 500 mg via INTRAVENOUS
  Filled 2012-08-13 (×8): qty 500

## 2012-08-13 MED ORDER — CHLORHEXIDINE GLUCONATE 0.12 % MT SOLN
15.0000 mL | Freq: Two times a day (BID) | OROMUCOSAL | Status: DC
  Administered 2012-08-13 – 2012-08-22 (×18): 15 mL via OROMUCOSAL
  Filled 2012-08-13 (×19): qty 15

## 2012-08-13 MED ORDER — VITAL AF 1.2 CAL PO LIQD
1000.0000 mL | ORAL | Status: DC
  Administered 2012-08-13 – 2012-08-14 (×2): 1000 mL
  Filled 2012-08-13 (×6): qty 1000

## 2012-08-13 MED ORDER — SODIUM CHLORIDE 0.9 % IV BOLUS (SEPSIS)
500.0000 mL | Freq: Once | INTRAVENOUS | Status: AC
  Administered 2012-08-13: 500 mL via INTRAVENOUS

## 2012-08-13 MED ORDER — SODIUM CHLORIDE 0.9 % IV BOLUS (SEPSIS)
1000.0000 mL | Freq: Once | INTRAVENOUS | Status: AC
  Administered 2012-08-13: 1000 mL via INTRAVENOUS

## 2012-08-13 MED ORDER — LORAZEPAM 2 MG/ML IJ SOLN
0.5000 mg | Freq: Four times a day (QID) | INTRAMUSCULAR | Status: DC | PRN
  Administered 2012-08-13 – 2012-08-16 (×3): 0.5 mg via INTRAVENOUS
  Filled 2012-08-13 (×3): qty 1

## 2012-08-13 MED ORDER — VANCOMYCIN HCL 1000 MG IV SOLR
750.0000 mg | Freq: Two times a day (BID) | INTRAVENOUS | Status: DC
  Administered 2012-08-13 – 2012-08-15 (×4): 750 mg via INTRAVENOUS
  Filled 2012-08-13 (×5): qty 750

## 2012-08-13 MED ORDER — INSULIN ASPART 100 UNIT/ML ~~LOC~~ SOLN: 0.0000 [IU] | Freq: Three times a day (TID) | SUBCUTANEOUS | Status: DC

## 2012-08-13 MED ORDER — BIOTENE DRY MOUTH MT LIQD
15.0000 mL | Freq: Four times a day (QID) | OROMUCOSAL | Status: DC
  Administered 2012-08-14 – 2012-08-22 (×34): 15 mL via OROMUCOSAL

## 2012-08-13 MED ORDER — FENTANYL CITRATE 0.05 MG/ML IJ SOLN
150.0000 ug | Freq: Once | INTRAMUSCULAR | Status: AC
  Administered 2012-08-13: 150 ug via INTRAVENOUS

## 2012-08-13 MED ORDER — POTASSIUM CHLORIDE 20 MEQ/15ML (10%) PO LIQD
ORAL | Status: AC
  Administered 2012-08-13: 40 meq
  Filled 2012-08-13: qty 30

## 2012-08-13 NOTE — Progress Notes (Signed)
ETT pulled back 1 cm to 23 per radioology

## 2012-08-13 NOTE — Progress Notes (Signed)
*  PRELIMINARY RESULTS* Vascular Ultrasound Lower extremity venous duplex has been completed.  Preliminary findings: Bilateral:  No evidence of DVT, superficial thrombosis, or Baker's Cyst.    Farrel Demark, RDMS, RVT 08/13/2012, 11:30 AM

## 2012-08-13 NOTE — Progress Notes (Signed)
Pt to transfer to 2914. HR 110-120s. BP 109/65. Pt lethargic, diffuse rhochi. Report given at bedside to [place], [place].

## 2012-08-13 NOTE — Progress Notes (Signed)
CVP not done after 15:00 due to IV team placing and discussing exchange of PICC line to triple lumen.  Will resume CVP monitoring and fluid boluses per order when PICC line placement verified with X-Ray.

## 2012-08-13 NOTE — Progress Notes (Addendum)
[facility] [place] TEAM 1 - Stepdown/ICU TEAM   Late Entry - pt examined at ~9:50 AM  Pt was examined in rounds earlier this morning.  Exam suggested progressive resp decline with worsening encephalopathy.  CXR was obtained, and suggested progressive volume loss in B bases.  Clinically the pt appeared to be declining gradually, and rapidly approaching the point at which resp arrest would be a significant likelihood.  As a result, I consulted PCCM who has agreed to transfer the pt to the ICU for threatened intubation.  Lonia Blood, MD [facility] Office  [phone] Pager [phone]  On-Call/Text Page:      [email or web address]      password [facility]

## 2012-08-13 NOTE — Progress Notes (Addendum)
Nutrition Follow-up  Intervention:    Re-initiate Vital AF 1.2 formula at 20 ml/hr and increase by 10 ml every 8 hours to goal rate of 60 ml/hr to provide 1728 kcals, 108 gm protein, 1168 ml of free water  Monitor Mg, Phos, K+ levels RD to follow for nutrition care plan  Assessment:   Patient currently intubated on ventilator support MV: 7.1 Temp: 36.9  Patient transferred from 2600.  Initial nutrition assessment completed 11/14.  Vital AF 1.2 formula via NGT (tip in distal stomach) held this AM due to concern for aspiration.  Per CCM note, now aspiration risk decreased due to intubation.  RD spoke with RN -- will resume EN orders.  Diet Order:  NPO  Meds: Scheduled Meds:   . albuterol  2.5 mg Nebulization Q6H  . enoxaparin (LOVENOX) injection  40 mg Subcutaneous Q24H  . [COMPLETED] fentaNYL  150 mcg Intravenous Once  . imipenem-cilastatin  500 mg Intravenous Q8H  . ketoconazole   Topical BID  . [COMPLETED] midazolam  4 mg Intravenous Once  . [COMPLETED] sodium chloride  1,000 mL Intravenous Once  . [COMPLETED] sodium chloride  500 mL Intravenous Once  . sodium chloride  10-40 mL Intracatheter Q12H  . sodium chloride  3 mL Intravenous Q12H  . vancomycin  750 mg Intravenous Q12H  . [DISCONTINUED] docusate sodium  100 mg Oral BID  . [DISCONTINUED] ipratropium  0.5 mg Nebulization Q6H   Continuous Infusions:   .  sodium bicarbonate infusion 1000 mL 125 mL/hr at 08/13/12 1150  . [DISCONTINUED] sodium chloride 50 mL/hr at 08/13/12 1046  . [DISCONTINUED] feeding supplement (VITAL AF 1.2 CAL) 1,000 mL (08/12/12 2000)  . [DISCONTINUED] sodium chloride 0.9 % 1,000 mL infusion     PRN Meds:.sodium chloride, fentaNYL, LORazepam, midazolam, ondansetron (ZOFRAN) IV, ondansetron, [DISCONTINUED] albuterol, [DISCONTINUED] fentaNYL, [DISCONTINUED] guaiFENesin-dextromethorphan, [DISCONTINUED] LORazepam, [DISCONTINUED] sodium chloride   CMP     Component Value Date/Time   NA 139 08/13/2012  1200   K 3.7 08/13/2012 1200   CL 109 08/13/2012 1200   CO2 17* 08/13/2012 1200   GLUCOSE 140* 08/13/2012 1200   BUN 54* 08/13/2012 1200   CREATININE 1.45* 08/13/2012 1200   CALCIUM 8.2* 08/13/2012 1200   PROT 6.0 08/13/2012 0454   ALBUMIN 1.5* 08/13/2012 0454   AST 70* 08/13/2012 0454   ALT 34 08/13/2012 0454   ALKPHOS 173* 08/13/2012 0454   BILITOT 2.2* 08/13/2012 0454   GFRNONAA 48* 08/13/2012 1200   GFRAA 56* 08/13/2012 1200    Magnesium  Date Value Range Status  08/12/2012 2.6* 1.5 - 2.5 mg/dL Final    Phosphorus  Date Value Range Status  08/12/2012 4.5  2.3 - 4.6 mg/dL Final    CBG (last 3)   Basename 08/13/12 1433 08/13/12 1159 08/13/12 0809  GLUCAP 149* 145* 115*     Intake/Output Summary (Last 24 hours) at 08/13/12 1452 Last data filed at 08/13/12 1300  Gross per 24 hour  Intake   5060 ml  Output    535 ml  Net   4525 ml    Weight Status:  81.6 kg (11/15) -- trending up  Re-estimated needs:  1700-1800 kcals, 105-120 gm protein  Nutrition Dx:  Inadequate Oral Intake r/t acute on chronic pancreatitis as evidenced by NPO status, ongoing  Goal:  EN to meet >/= 90% of estimated nutrition needs, currently unmet  Monitor:  EN regimen & tolerance, respiratory status, weight, labs, I/O's  Kirkland Hun, RD, LDN Pager #: [phone] After-Hours Pager #:  [phone]    

## 2012-08-13 NOTE — Progress Notes (Signed)
[facility] Daily Rounding Note 08/13/2012, 8:56 AM  SUBJECTIVE:       Remains obtunded.  Agitated at times, got 2 doses of Ativan yesterday.   OBJECTIVE:         Vital signs in last 24 hours:    Temp:  [97.7 F (36.5 C)-98.7 F (37.1 C)] 98.1 F (36.7 C) (11/15 0744) Pulse Rate:  [98-116] 108  (11/15 0744) Resp:  [18-23] 19  (11/15 0744) BP: (98-112)/(60-86) 110/78 mmHg (11/15 0744) SpO2:  [6 %-99 %] 99 % (11/15 0744) Weight:  [80.2 kg (176 lb 12.9 oz)] 80.2 kg (176 lb 12.9 oz) (11/15 0500) Last BM Date: 08/11/12 General: Labored breathing,   Heart: tachy , regular. Chest: labored breathing, wet airway/oral secretions that he is not clearing Abdomen: soft,  NT, no mass.  BS hypoactive.  Extremities: 2 to 3 + edema, knees down, this is tender. Neuro/Psych:  Confused, not able to follow commands.  Intake/Output from previous day: 11/14 0701 - 11/15 0700 In: 4945 [I.V.:4600; NG/GT:320; IV Piggyback:25] Out: 410 [Urine:400; Emesis/NG output:10]  Intake/Output this shift: Total I/O In: 60 [I.V.:20; NG/GT:40] Out: -   Lab Results:  Basename 08/13/12 0454 08/12/12 0510 08/11/12 0425  WBC 16.7* 17.2* 23.0*  HGB 12.3* 13.9 13.5  HCT 35.1* 39.9 37.3*  PLT 303 243 278   BMET  Basename 08/13/12 0454 08/12/12 0510 08/11/12 0425  NA 135 131* 130*  K 3.8 4.9 4.2  CL 104 98 96  CO2 16* 12* 17*  GLUCOSE 166* 139* 110*  BUN 52* 46* 51*  CREATININE 1.46* 1.40* 1.71*  CALCIUM 8.0* 7.7* 7.5*   LFT  Basename 08/13/12 0454 08/12/12 0510 08/11/12 0425  PROT 6.0 5.9* 5.7*  ALBUMIN 1.5* 1.4* 1.6*  AST 70* 77* 71*  ALT 34 39 44  ALKPHOS 173* 125* 109  BILITOT 2.2* 2.4* 2.3*  BILIDIR -- -- --  IBILI -- -- --  Lipase        1264              1191               1448   PT/INR  Basename 08/11/12 0425 08/21/2012 1845  LABPROT 19.0* 18.2*  INR 1.65* 1.56*   Hepatitis Panel No results found for this basename: HEPBSAG,HCVAB,HEPAIGM,HEPBIGM in the last 72  hours  Studies/Results: Dg Abd 1 View 08/12/2012   Findings: NG tube within the stomach, tip is within the distal antropyloric channel.  IMPRESSION: NG tube in the distal stomach.   Original Report Authenticated By: Judie Petit. Shick, M.D.    Dg Abd Portable 2v 08/12/2012   IMPRESSION: Nonspecific bowel gas pattern without plain film evidence of bowel obstruction or free intraperitoneal air.  CT noted loculated perihepatic fluid not well delineated by plain film exam.   Original Report Authenticated By: Lacy Duverney, M.D.         ASSESMENT: * Acute on chronic pancreatitis. Biliary vs etoh related.  Likely etoh. Lipase rising.  GB sludge and stones on CT but CBD not diated. AST, Alk Phos elevated but suspect element of alcoholic hepatitis. Lap chole when pancreatitis resolved. Likely to be done in coordination with left inguinal herniorrhaphy.  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 No evidence that this is infected. No fever. WBCs improved.  Fluids returned to 150 ml/hour. NPO.  ABX discontinued 08/12/2012 Feeding NG tube placed 11/14.  TF at 40 per hour * Alcoholism. Acute withdrawal last week during admission. Encephalopathy  currently, narcotics may be contributing.  * Renal insufficiency.  No  Further improvement in BUN/creat. Oliguric. * Hyponatremia, resolved.  * LE edema. EF is 50 to 55% per echo.  * Left inguinal hernia. Surgery had been planned for some time in future, date had not been set.  * Right lung atx.  * Recent dental extractions x 4.  * Hyperglycemia, no hx DM  * Protein malnutrition, low albumin. Peripheral edema is likely from this and from 3rd spacing of fluid.  * Coagulopathy, mild. Platelets normal.  * Resp insufficiency, treated for bronchitis 2 weeks ago. No fever. ATX on chest films.     PLAN: *  Bolus with one liter of NS. *  Would involve Critical care as he looks like he will need ETT to protect airway.  *  Will stop TF for now, to lower risk of  aspiration.  *  coags today.    LOS: 3 days   Jennye Moccasin  08/13/2012, 8:56 AM Pager: [phone]   GI ATTENDING  Seen and examined. Laboratories reviewed. Agree with above. Tenuous respiratory status and mental status. Renal insufficiency persists. Agree with critical care medicine consult and transfer to the ICU.  [name]. [name]., M.D. [facility] Division of Gastroenterology

## 2012-08-13 NOTE — Progress Notes (Signed)
Subjective: Asleep this am, not communicating  Objective: Vital signs in last 24 hours: Temp:  [97.7 F (36.5 C)-98.7 F (37.1 C)] 98.1 F (36.7 C) (11/15 0744) Pulse Rate:  [98-116] 108  (11/15 0744) Resp:  [18-23] 19  (11/15 0744) BP: (98-112)/(60-86) 110/78 mmHg (11/15 0744) SpO2:  [6 %-99 %] 99 % (11/15 0744) Weight:  [176 lb 12.9 oz (80.2 kg)] 176 lb 12.9 oz (80.2 kg) (11/15 0500) Last BM Date: 08/11/12  Intake/Output from previous day: 11/14 0701 - 11/15 0700 In: 4945 [I.V.:4600; NG/GT:320; IV Piggyback:25] Out: 410 [Urine:400; Emesis/NG output:10] Intake/Output this shift: Total I/O In: 60 [I.V.:20; NG/GT:40] Out: -   GI: moderately distended, tender upper abdomen, lih present nontender  Lab Results:   [facility] 08/13/12 0454 08/12/12 0510  WBC 16.7* 17.2*  HGB 12.3* 13.9  HCT 35.1* 39.9  PLT 303 243   BMET  Basename 08/13/12 0454 08/12/12 0510  NA 135 131*  K 3.8 4.9  CL 104 98  CO2 16* 12*  GLUCOSE 166* 139*  BUN 52* 46*  CREATININE 1.46* 1.40*  CALCIUM 8.0* 7.7*   PT/INR  Basename 08/11/12 0425 2012/08/11 1845  LABPROT 19.0* 18.2*  INR 1.65* 1.56*   ABG No results found for this basename: PHART:2,PCO2:2,PO2:2,HCO3:2 in the last 72 hours  Studies/Results: Dg Abd 1 View  08/12/2012  *RADIOLOGY REPORT*  Clinical Data: Feeding tube placement  ABDOMEN - 1 VIEW  Comparison: 08/12/2012  Findings: NG tube within the stomach, tip is within the distal antropyloric channel.  IMPRESSION: NG tube in the distal stomach.   Original Report Authenticated By: Judie Petit. Miles Costain, M.D.    Dg Abd Portable 2v  08/12/2012  *RADIOLOGY REPORT*  Clinical Data: Abdominal pain.  Pancreatitis.  Question ileus.  PORTABLE ABDOMEN - 2 VIEW  Comparison: 2012-08-11 CT.  Findings: Nonspecific bowel gas pattern without plain film evidence of bowel obstruction or free intraperitoneal air.  CT noted loculated perihepatic fluid not well delineated by plain film exam.  IMPRESSION: Nonspecific  bowel gas pattern without plain film evidence of bowel obstruction or free intraperitoneal air.  CT noted loculated perihepatic fluid not well delineated by plain film exam.   Original Report Authenticated By: Lacy Duverney, M.D.    Dg Naso G Tube Plc W/fl-no Rad  08/12/2012  CLINICAL DATA: place NG for feeds- prefer small bore   NASO G TUBE PLACEMENT WITH FLUORO  Fluoroscopy was utilized by the requesting physician.  No radiographic  interpretation.      Anti-infectives: Anti-infectives     Start     Dose/Rate Route Frequency Ordered Stop   08/12/12 1700   levofloxacin (LEVAQUIN) IVPB 750 mg  Status:  Discontinued        750 mg 100 mL/hr over 90 Minutes Intravenous Every 48 hours 11-Aug-2012 1749 08/12/12 1130   08/12/12 0600   vancomycin (VANCOCIN) IVPB 1000 mg/200 mL premix  Status:  Discontinued        1,000 mg 200 mL/hr over 60 Minutes Intravenous Every 24 hours 08/11/12 1141 08/12/12 1130   08/11/2012 2200   piperacillin-tazobactam (ZOSYN) IVPB 3.375 g  Status:  Discontinued        3.375 g 12.5 mL/hr over 240 Minutes Intravenous Every 8 hours 08/11/12 1749 08/12/12 1130   11-Aug-2012 1800   vancomycin (VANCOCIN) 750 mg in sodium chloride 0.9 % 150 mL IVPB  Status:  Discontinued        750 mg 150 mL/hr over 60 Minutes Intravenous Every 12 hours August 11, 2012 1749 08/11/12 1141  Aug 21, 2012 1530   vancomycin (VANCOCIN) IVPB 1000 mg/200 mL premix  Status:  Discontinued        1,000 mg 200 mL/hr over 60 Minutes Intravenous  Once 08-21-12 1528 08-21-2012 1808   2012-08-21 1530   piperacillin-tazobactam (ZOSYN) IVPB 3.375 g        3.375 g 100 mL/hr over 30 Minutes Intravenous  Once 08-21-2012 1528 08/21/2012 1630   Aug 21, 2012 1530   levofloxacin (LEVAQUIN) IVPB 750 mg  Status:  Discontinued        750 mg 100 mL/hr over 90 Minutes Intravenous  Once 21-Aug-2012 1528 08/21/12 1808          Assessment/Plan: Pancreatitis LIH Continue conservative mgt for now, will eventually need surgery for both  issues  [facility] 08/13/2012

## 2012-08-13 NOTE — Progress Notes (Signed)
eLink Physician-Brief Progress Note Patient Name: JAQUARIUS SEDER DOB: [birth year] MRN: [id number]  Date of Service  08/13/2012   HPI/Events of Note   Hyperglycemia   eICU Interventions  SSI ordered      [facility] 08/13/2012, 11:59 PM

## 2012-08-13 NOTE — Progress Notes (Signed)
ANTIBIOTIC CONSULT NOTE - INITIAL  Pharmacy Consult for vanc/imipenem Indication: rule out pneumonia  No Known Allergies  Patient Measurements: Height: 5\' 8"  (172.7 cm) Weight: 176 lb 12.9 oz (80.2 kg) IBW/kg (Calculated) : 68.4   Vital Signs: Temp: 98.3 F (36.8 C) (11/15 1126) Temp src: Axillary (11/15 1126) BP: 109/65 mmHg (11/15 1126) Pulse Rate: 111  (11/15 1126) Intake/Output from previous day: 11/14 0701 - 11/15 0700 In: 4945 [I.V.:4600; NG/GT:320; IV Piggyback:25] Out: 410 [Urine:400; Emesis/NG output:10] Intake/Output from this shift: Total I/O In: 790 [I.V.:750; NG/GT:40] Out: 125 [Urine:125]  Labs:  [facility] 08/13/12 0454 08/12/12 0510 08/11/12 1243 08/11/12 0425  WBC 16.7* 17.2* -- 23.0*  HGB 12.3* 13.9 -- 13.5  PLT 303 243 -- 278  LABCREA -- -- 107.33 --  CREATININE 1.46* 1.40* -- 1.71*   Estimated Creatinine Clearance: 47.5 ml/min (by C-G formula based on Cr of 1.46).  Medical History: Past Medical History  Diagnosis Date  . Hypertension   . GERD (gastroesophageal reflux disease)   . Pneumonia     "5-7 years ago" (08/03/2012)  . Exertional dyspnea     "just recently" (08/03/2012)  . Delirium, acute 08/03/2012  . Prostate cancer     Medications:  Prescriptions prior to admission  Medication Sig Dispense Refill  . aspirin EC 81 MG tablet Take 162 mg by mouth daily.      . cholecalciferol (VITAMIN D) 1000 UNITS tablet Take 1,000 Units by mouth daily.      . ciprofloxacin (CIPRO) 500 MG tablet Take 1 tablet (500 mg total) by mouth 2 (two) times daily.  10 tablet  0  . ciprofloxacin (CIPRO) 500 MG tablet Take 500 mg by mouth 2 (two) times daily. For 10 days starting on 08/07/12      . feeding supplement (ENSURE COMPLETE) LIQD Take 237 mLs by mouth 2 (two) times daily between meals.  60 Bottle  0  . folic acid (FOLVITE) 1 MG tablet Take 1 tablet (1 mg total) by mouth daily.  30 tablet  0  . guaiFENesin-dextromethorphan (ROBITUSSIN DM) 100-10 MG/5ML  syrup Take 5 mLs by mouth every 4 (four) hours as needed for cough.  118 mL    . lisinopril (PRINIVIL,ZESTRIL) 10 MG tablet Take 10 mg by mouth daily.      . metroNIDAZOLE (FLAGYL) 500 MG tablet Take 500 mg by mouth 3 (three) times daily. For 10 days starting on 08/07/12      . Multiple Vitamin (MULTIVITAMIN WITH MINERALS) TABS Take 1 tablet by mouth daily.  30 tablet  0  . omeprazole (PRILOSEC) 20 MG capsule Take 20 mg by mouth daily.      Marland Kitchen thiamine 100 MG tablet Take 1 tablet (100 mg total) by mouth daily.  30 tablet  0  . vitamin E 400 UNIT capsule Take 800 Units by mouth daily.       Assessment: 66 yo male with progressive respiratory distress  With possible PNA, pancreatitis with evolving pseudocyst and lower extremity cellulitis. Patient ordered imipenem and vancomycin. Last dose of vancomycin was 1000mg  on 08/12/12 at 0600.  SCr=1.45 and CrCl~ 50 ml/min, WBC=16.7 and patient currently afebrile.  11/13 urine culture: Negative.  11/12 blood culture x2>>>  Vanc 11/12 >>11/14 Zosyn 11/12 >>11/15 Levaquin 11/12 >> 11/13  11/12 Blood x 2>> pending 11/13 Urine >> neg  Goal of Therapy:  Vancomycin trough level 15-20 mcg/ml  Plan:  -Vancomycin 750mg  IV q12h -Imipenem 500mg  IV q8hr -Will follow cultures and renal function  Harland German, Pharm D 08/13/2012 12:54 PM

## 2012-08-13 NOTE — Progress Notes (Signed)
eLink Physician-Brief Progress Note Patient Name: Gary [name] DOB: November 17, 1945 MRN: [id number]  Date of Service  08/13/2012   HPI/Events of Note   PPI ordered for sup  eICU Interventions  Best practice   Intervention Category Intermediate Interventions: Best-practice therapies (e.g. DVT, beta blocker, etc.)  Shan Levans 08/13/2012, 4:18 PM

## 2012-08-13 NOTE — Consult Note (Signed)
PULMONARY  / CRITICAL CARE MEDICINE  Name: Gary [name] MRN: [id number] DOB: 1946-05-20    LOS: 3  REFERRING MD :  [name]  CHIEF COMPLAINT: encephalopathy, respiratory insufficiency  BRIEF PATIENT DESCRIPTION:  66 year old white male admitted 11/12 for abdominal pain in the setting of acute pancreatitis an incarcerated left inguinal hernia. Initially admitted to medicine service. Critical care asked to see on 11/15 for encephalopathy, and concern for progressive respiratory distress and inability to protect airway.  LINES / TUBES: right W. PICC line 11/14>>>  CULTURES: 11/13 urine culture: Negative. 11/12 blood culture x2>>>  ANTIBIOTICS: 11/12 levofloxacin: 1 dose. Zosyn 11/12>>>11/14 Primaxin 11/15>>> Vancomycin 11/12>>>11/14>>> 11/15>>>  SIGNIFICANT EVENTS:  CT abdomen 11/12: Enlargement of. Pancreatic fluid collection with significant mass effect on both right and left lobe of liver. Suggesting enlarging pseudocysts. Nonspecific nodularity edema of the mesentery with no focal mass. Stable left inguinal hernia. Abdominal ultrasound 11/12: gallstone with echogenic sludge, but no evidence of biliary obstruction or acute cholecystitis. Increased ascites compared to CT 7 days prior fluid around the liver with mass effect. ECHO 11/12: Systolic function was normal. The estimated ejection fraction was in the range of 50% to 55%. There was an increased relative contribution of atrial contraction to ventricular filling.   Bilateral LE ultrasound in 11/15>>>> LEVEL OF CARE:  Step down unit PRIMARY SERVICE:  [facility] CONSULTANTS:  PC CM CODE STATUS full code DIET:  N.p.o. DVT Px:  Subcutaneous heparin GI Px:    HISTORY OF PRESENT ILLNESS:   This is 67 year old male patient with a history of prostate cancer, hypertension, and alcohol abuse. Recently discharged from the hospital for alcohol withdrawal and abdominal pain secondary to incarcerated left inguinal hernia which was manually  reduced by surgery. He was discharged home with planned followup for potential elective hernia repair. Her spouse has had one alcoholic beverage since discharge. Presented to the emergency room on 11/12 with chief complaint of generalized weakness, and increasing fatigue. Also nonproductive cough. Also still complaining of abdominal pain both intermittently upper abdomen, and lower abdomen. Diagnostic evaluation was consistent with pancreatitis CT abdomen demonstrated what appears to be progressive pseudocyst he was admitted by medical services for supportive care. In addition he's had progressive bilateral lower extremity swelling and discomfort with erythema felt to be consistent with candida dermatitis. Critical care services were asked to see on 11/15, as the patient has become progressively lethargic, with poor cough mechanics, worsening abdominal pain, and decreased level of consciousness. Therapeutic interventions to date have included IV antibiotics, both surgical and GI consult services, as well as IV fluid resuscitation. Chest x-ray evaluation on 1112 demonstrates progressive bibasilar right greater than left volume loss.  PAST MEDICAL HISTORY :  Past Medical History  Diagnosis Date  . Hypertension   . GERD (gastroesophageal reflux disease)   . Pneumonia     "5-7 years ago" (08/03/2012)  . Exertional dyspnea     "just recently" (08/03/2012)  . Delirium, acute 08/03/2012  . Prostate cancer    Past Surgical History  Procedure Date  . Prostate surgery   . Hydrocele excision / repair 2010  . Inguinal hernia repair 12/08/2011    Procedure: HERNIA REPAIR INGUINAL ADULT;  Surgeon: Liz Malady, MD;  Location: [facility] OR;  Service: General;  Laterality: Right;  repair right inguinal hernia with mesh  . Irrigation and debridement abscess 03/01/2012    Procedure: IRRIGATION AND DEBRIDEMENT ABSCESS;  Surgeon: Ernestene Mention, MD;  Location: [facility] OR;  Service: General;  Laterality:  Right;  Incision and  drainage with debridement right breast abscess  . Tonsillectomy   . Cataract extraction w/ intraocular lens  implant, bilateral 2008   Prior to Admission medications   Medication Sig Start Date End Date Taking? Authorizing Provider  aspirin EC 81 MG tablet Take 162 mg by mouth daily.   Yes Historical Provider, MD  cholecalciferol (VITAMIN D) 1000 UNITS tablet Take 1,000 Units by mouth daily.   Yes Historical Provider, MD  ciprofloxacin (CIPRO) 500 MG tablet Take 1 tablet (500 mg total) by mouth 2 (two) times daily. 08/07/12  Yes Shanker Levora Dredge, MD  ciprofloxacin (CIPRO) 500 MG tablet Take 500 mg by mouth 2 (two) times daily. For 10 days starting on 08/07/12   Yes Historical Provider, MD  feeding supplement (ENSURE COMPLETE) LIQD Take 237 mLs by mouth 2 (two) times daily between meals. 08/07/12  Yes Shanker Levora Dredge, MD  folic acid (FOLVITE) 1 MG tablet Take 1 tablet (1 mg total) by mouth daily. 08/07/12  Yes Shanker Levora Dredge, MD  guaiFENesin-dextromethorphan (ROBITUSSIN DM) 100-10 MG/5ML syrup Take 5 mLs by mouth every 4 (four) hours as needed for cough. 08/07/12  Yes Shanker Levora Dredge, MD  lisinopril (PRINIVIL,ZESTRIL) 10 MG tablet Take 10 mg by mouth daily.   Yes Historical Provider, MD  metroNIDAZOLE (FLAGYL) 500 MG tablet Take 500 mg by mouth 3 (three) times daily. For 10 days starting on 08/07/12   Yes Historical Provider, MD  Multiple Vitamin (MULTIVITAMIN WITH MINERALS) TABS Take 1 tablet by mouth daily. 08/07/12  Yes Shanker Levora Dredge, MD  omeprazole (PRILOSEC) 20 MG capsule Take 20 mg by mouth daily.   Yes Historical Provider, MD  thiamine 100 MG tablet Take 1 tablet (100 mg total) by mouth daily. 08/07/12  Yes Shanker Levora Dredge, MD  vitamin E 400 UNIT capsule Take 800 Units by mouth daily.   Yes Historical Provider, MD   No Known Allergies  FAMILY HISTORY:  Family History  Problem Relation Age of Onset  . Heart disease Father    SOCIAL HISTORY:  reports that he has been smoking  Cigarettes.  He has a 52 pack-year smoking history. He has never used smokeless tobacco. He reports that he drinks about 16.8 ounces of alcohol per week. He reports that he does not use illicit drugs.  REVIEW OF SYSTEMS:  Unable to obtain due to to altered sensorium   INTERVAL HISTORY:  Currently in step down sitting VITAL SIGNS: Temp:  [97.7 F (36.5 C)-98.7 F (37.1 C)] 98.1 F (36.7 C) (11/15 0744) Pulse Rate:  [98-116] 108  (11/15 0744) Resp:  [18-23] 19  (11/15 0744) BP: (98-112)/(60-86) 110/78 mmHg (11/15 0744) SpO2:  [6 %-99 %] 94 % (11/15 1007) Weight:  [80.2 kg (176 lb 12.9 oz)] 80.2 kg (176 lb 12.9 oz) (11/15 0500) Room air HEMODYNAMICS:   VENTILATOR SETTINGS:   INTAKE / OUTPUT: Intake/Output      11/14 0701 - 11/15 0700 11/15 0701 - 11/16 0700   I.V. (mL/kg) 4600 (57.4) 525 (6.5)   NG/GT 320 40   IV Piggyback 25    Total Intake(mL/kg) 4945 (61.7) 565 (7)   Urine (mL/kg/hr) 400 (0.2)    Emesis/NG output 10    Total Output 410    Net +4535 +565        Urine Occurrence 1 x      PHYSICAL EXAMINATION: General:  Chronically ill-appearing 66 year old male appears older than stated age. Not currently in acute distress, however physical  exam is marginal at best. Neuro:  Phonation notable for wet rhonchorous noises, he is oriented x2, he is profoundly weak in all extremities HEENT:  Normal systolic, no JVD Cardiovascular:  No murmur rub or gallop Lungs:  Scattered rhonchi Abdomen:  Distended, firm, generalized discomfort to palpation. Has shifting dullness to percussion. Positive bowel sounds Musculoskeletal:  Generalized weakness but intact Skin:  Lower extremities are both swollen and edematous. Erythemic, warm to touch, and patient reports significant discomfort to gentle palpation bilaterally   LABS:  Lab 08/13/12 0955 08/13/12 0454 08/12/12 0510 08/11/12 0425 Aug 30, 2012 1845  HGB -- 12.3* 13.9 13.5 --  WBC -- 16.7* 17.2* 23.0* --  PLT -- 303 243 278 --  NA --  135 131* 130* --  K -- 3.8 4.9 -- --  CL -- 104 98 96 --  CO2 -- 16* 12* 17* --  GLUCOSE -- 166* 139* 110* --  BUN -- 52* 46* 51* --  CREATININE -- 1.46* 1.40* 1.71* --  CALCIUM -- 8.0* 7.7* 7.5* --  MG -- -- 2.6* -- --  PHOS -- -- 4.5 -- --  AST -- 70* 77* 71* --  ALT -- 34 39 44 --  ALKPHOS -- 173* 125* 109 --  BILITOT -- 2.2* 2.4* 2.3* --  PROT -- 6.0 5.9* 5.7* --  ALBUMIN -- 1.5* 1.4* 1.6* --  APTT -- -- -- -- --  INR -- -- -- 1.65* 1.56*  LATICACIDVEN -- -- -- -- --  TROPONINI -- -- -- -- --  PROCALCITON -- -- -- -- --  PROBNP -- -- -- -- --  O2SATVEN -- -- -- -- --  PHART 7.416 -- -- -- --  PCO2ART 23.9* -- -- -- --  PO2ART 61.8* -- -- -- --    Lab 08/13/12 0809 08/13/12 0401 08/13/12 0010 08/12/12 2018 08/12/12 1731  GLUCAP 115* 128* 136* 126* 130*   Lipase     Component Value Date/Time   LIPASE 1264* 08/13/2012 0454   IMAGING: PCXR: Progressive bibasilar volume loss right greater than left consistent with worsening atelectasis  ECG:  DIAGNOSES: Principal Problem:  *Pancreatitis, alcoholic, acute Active Problems:  HTN (hypertension)  Alcohol abuse  Tobacco abuse  HCAP (healthcare-associated pneumonia)  ARF (acute renal failure)  Hyperkalemia  Hyponatremia  Hernia, inguinal  Cholelithiases  Leukocytosis  Pancreatic pseudocyst/cyst  Lower extremity pain, bilateral  Candidal dermatitis  Dehydration  Acute respiratory failure with hypoxia  Alcoholic liver damage  Nonspecific elevation of levels of transaminase or lactic acid dehydrogenase (LDH)   ASSESSMENT / PLAN:  PULMONARY  ASSESSMENT: Bibasilar right greater than left atelectasis Currently gas exchange and oxygenation are adequate. He has progression of bibasilar volume loss right greater than left. This could reflect healthcare associated pneumonia versus aspiration pneumonia,but would favor progressive atelectasis. He has a decent cough when requested, however not able to expectorate  secretions. Certainly at risk for inability to protect airway. PLAN:   Placed nasal trumpet Pulmonary hygiene escalation with vibravest, bronchodilators, and NT suctioning as needed Will evaluate later today, his current mental status may require intubation for airway protection, however would like to try less invasive approach first. We'll move to the intensive care in anticipation of heart level of support needed in the near future  CARDIOVASCULAR  ASSESSMENT:  Systemic inflammatory response syndrome/Sirs and sepsis: in the setting of pancreatitis with evolving pseudocyst. potential sources: being infected pseudocyst, lower extremity cellulitis. Also possible aspiration versus hospital associated pneumonia Lower extremity edema: Bilateral likely reflects cellulitis,  however need to rule out deep vein thrombosis PLAN:  Continue broad-spectrum antibiotics Check lactate Continue IV hydration Central venous pressure monitoring  RENAL  ASSESSMENT:   Acute renal failure Probably multi-factorial in the setting of fluid volume depletion/dehydration, further complicated by ACE inhibitor antihypertensive therapy Metabolic acidosis: this appears to exhibit both positive and a non-gap properties PLAN:   We will check lactate Central venous pressure reading Continue fluid volume resuscitation Hold the ACE inhibitor Renal dose medications Change IV fluids to D5 water with 3 amps of bicarbonate  GASTROINTESTINAL  ASSESSMENT:   Acute pancreatitis with pseudocyst: still in marked abdominal discomfort, progressive ascites, worsening abdominal distention. Tube feeds held currently due to concern for aspiration Incarcerated left inguinal hernia: surgery planned for future, but currently elective Cholelithiasis: no evidence of cholecystitis Abnormal LFTs in the setting of alcoholic hepatitis Protein calorie malnutrition PLAN:   GI and surgical services are following Currently gastric feedings  on hold due to concern for aspiration N.p.o. For now, is intubated would resume tube feeds as aspiration risk would then be decreased Continue empiric antibiotics Will defer timing of neck CT scan to surgery  HEMATOLOGIC  ASSESSMENT: Hemodilution related anemia: no current evidence of blood loss Coagulopathy: Mild, probably related to alcohol hepatitis PLAN:  Repeat coags Continue LMWH (h/o prostate CA) Transfuse for hemoglobin less than 7 and last he developed shock, then goal directed therapy protocol will drive transfusion trigger  INFECTIOUS  ASSESSMENT:   Sirs/sepsis: Multiple potential sources as follows: Infected pseudocyst, HCAP Versus aspiration pneumonia and lower extremity cellulitis. Possible candida dermatitis both lower extremities. PLAN:   We will resume broad-spectrum antibiotics Follow pending cultures Send Procalcitonin Also need to check lower extremity ultrasound to rule out DVT Continue Nizoral cream to lower extremities  ENDOCRINE  ASSESSMENT:   hyperglycemia PLAN:   Cont CBG, currently not needing SSI  NEUROLOGIC  ASSESSMENT:   Acute encephalopathy: Likely in the setting of sepsis. Additionally concerned about alcohol withdrawal PLAN:   Supportive care  CLINICAL SUMMARY:  66 year old male patient admitted with abdominal pain and progressive weakness pancreatitis, left inguinal hernia (currently reduced), dehydration and renal failure. Initially admitted to the medical ward with supportive therapy including IV fluids, GI and surgical consultations, and empiric antibiotics. He's had progressive weakness, decreased level of consciousness, and now his ability to adequately protect airway is in question. He currently has a decent cough when instructed to cough, however his chest x-ray demonstrates significant decreased right lung volumes. We will move him to the intensive care, initially we will try aggressive pulmonary hygiene measures, this will include  bronchodilator therapy, NTS suctioning, and chest physiotherapy. Deeply concerned that he will require intubation given his decreased level of consciousness and profound weakness. We will re-institute antibiotics given concern for possible aspiration versus healthcare associated pneumonia. Check lactic acid to rule out occult sepsis. Also need to obtain lower extremity ultrasounds to be certain lower extremity swelling is not related to deep vein thrombosis. Mr. Salmons condition is very guarded, with a very high likelihood he will require intubation in the next 24 hours.  I have personally obtained a history, examined the patient, evaluated laboratory and imaging results, formulated the assessment and plan and placed orders.  CRITICAL CARE: The patient is critically ill with multiple organ systems failure and requires high complexity decision making for assessment and support, frequent evaluation and titration of therapies, application of advanced monitoring technologies and extensive interpretation of multiple databases. Critical Care Time devoted to patient care services  described in this note is 45 minutes.   Will transfer patient to the ICU, intubate and mechanically ventilate.  Continue primaxin and vanc, the patient has not had a drink since has been in the hospital so risk of DTs is minimal.  AMS is likely due to sepsis.  Patient has nuchal rigidity to suggest meningitis, will continue treatment with abx and will re-image the abdomen in AM when patient is more stable.  Alyson Reedy, M.D. [facility] Pulmonary/Critical Care Medicine. Pager: [phone]. After hours pager: [phone].

## 2012-08-13 NOTE — Progress Notes (Signed)
PT Cancellation Note  Patient Details Name: Gary [name] MRN: [id number] DOB: Apr 03, 1946   Cancelled Treatment:    Reason Eval/Treat Not Completed: Medical issues which prohibited therapy (Dr.Mcclung exiting room on arrival and request hold stating pt with increased difficulty with pancreatitis and does not look well today.) Will check back on Monday for appropriateness. Delaney Meigs, PT [phone]

## 2012-08-13 NOTE — Procedures (Signed)
Intubation Procedure Note KIVON MARCELLUS [id number] 04-Dec-1945  Procedure: Intubation Indications: Airway protection and maintenance  Procedure Details Consent: Risks of procedure as well as the alternatives and risks of each were explained to the (patient/caregiver).  Consent for procedure obtained. Time Out: Verified patient identification, verified procedure, site/side was marked, verified correct patient position, special equipment/implants available, medications/allergies/relevent history reviewed, required imaging and test results available.  Performed  Maximum sterile technique was used including gloves, hand hygiene and mask.  MAC    Evaluation Hemodynamic Status: BP stable throughout; O2 sats: stable throughout Patient's Current Condition: stable Complications: No apparent complications Patient did tolerate procedure well. Chest X-ray ordered to verify placement.  CXR: pending.   YACOUB,WESAM 08/13/2012

## 2012-08-13 NOTE — Progress Notes (Signed)
Pt becoming increasingly lethargic and unable to cough up secretions. Nasal trumpet placed by RT for NTS. TF stopped, CCM to consult. Family at bedside. Will cont to monitor pt. Gary [name]

## 2012-08-13 NOTE — Progress Notes (Signed)
Clinical Social Work  CSW observed that patient moved to 2900. CSW went to bedside but patient unable to participate in session with CSW. No visitors present. CSW called wife to offer support and allowed wife time to express her feelings of being overwhelmed. CSW encouraged wife to contact CSW with any needs that arise. Wife was appreciative of phone call. CSW will continue to follow.  [employer], [place] [phone]

## 2012-08-14 LAB — BASIC METABOLIC PANEL
BUN: 54 mg/dL — ABNORMAL HIGH (ref 6–23)
BUN: 61 mg/dL — ABNORMAL HIGH (ref 6–23)
CO2: 20 mEq/L (ref 19–32)
Calcium: 7.8 mg/dL — ABNORMAL LOW (ref 8.4–10.5)
Chloride: 101 mEq/L (ref 96–112)
Chloride: 106 mEq/L (ref 96–112)
Creatinine, Ser: 1.68 mg/dL — ABNORMAL HIGH (ref 0.50–1.35)
Creatinine, Ser: 2.13 mg/dL — ABNORMAL HIGH (ref 0.50–1.35)
Glucose, Bld: 533 mg/dL — ABNORMAL HIGH (ref 70–99)
Glucose, Bld: 181 mg/dL — ABNORMAL HIGH (ref 70–99)
Potassium: 3.1 mEq/L — ABNORMAL LOW (ref 3.5–5.1)

## 2012-08-14 LAB — COMPREHENSIVE METABOLIC PANEL
ALT: 42 U/L (ref 0–53)
AST: 109 U/L — ABNORMAL HIGH (ref 0–37)
Albumin: 1.2 g/dL — ABNORMAL LOW (ref 3.5–5.2)
CO2: 18 mEq/L — ABNORMAL LOW (ref 19–32)
Chloride: 108 mEq/L (ref 96–112)
GFR calc non Af Amer: 41 mL/min — ABNORMAL LOW (ref >90)
Potassium: 3.9 mEq/L (ref 3.5–5.1)
Sodium: 138 mEq/L (ref 135–145)
Total Bilirubin: 1.7 mg/dL — ABNORMAL HIGH (ref 0.3–1.2)

## 2012-08-14 LAB — CBC
Platelets: 252 10*3/uL (ref 150–400)
RBC: 3.28 MIL/uL — ABNORMAL LOW (ref 4.22–5.81)
RDW: 15.5 % (ref 11.5–15.5)
WBC: 13.3 10*3/uL — ABNORMAL HIGH (ref 4.0–10.5)

## 2012-08-14 LAB — BLOOD GAS, ARTERIAL
Acid-base deficit: 6.6 mmol/L — ABNORMAL HIGH (ref 0.0–2.0)
Drawn by: 232811
FIO2: 0.4 %
MECHVT: 470 mL
O2 Saturation: 97.8 %
PEEP: 5 cmH2O
RATE: 15 resp/min
pCO2 arterial: 28.3 mmHg — ABNORMAL LOW (ref 35.0–45.0)
pO2, Arterial: 98.6 mmHg (ref 80.0–100.0)

## 2012-08-14 LAB — HEMOGLOBIN A1C: Hgb A1c MFr Bld: 5.9 % — ABNORMAL HIGH (ref <5.7)

## 2012-08-14 LAB — GLUCOSE, CAPILLARY
Glucose-Capillary: 180 mg/dL — ABNORMAL HIGH (ref 70–99)
Glucose-Capillary: 184 mg/dL — ABNORMAL HIGH (ref 70–99)

## 2012-08-14 LAB — CARBOXYHEMOGLOBIN
Methemoglobin: 0.9 % (ref 0.0–1.5)
O2 Saturation: 61.7 %
Total hemoglobin: 10.7 g/dL — ABNORMAL LOW (ref 13.5–18.0)

## 2012-08-14 LAB — PROCALCITONIN: Procalcitonin: 4.36 ng/mL

## 2012-08-14 LAB — LIPASE, BLOOD: Lipase: 1558 U/L — ABNORMAL HIGH (ref 11–59)

## 2012-08-14 MED ORDER — DOBUTAMINE IN D5W 4-5 MG/ML-% IV SOLN: 2.5000 ug/kg/min | INTRAVENOUS | Status: DC | PRN

## 2012-08-14 MED ORDER — SODIUM CHLORIDE 0.9 % IV SOLN
750.0000 mL | INTRAVENOUS | Status: DC | PRN
  Administered 2012-08-14 – 2012-08-22 (×5): 750 mL via INTRAVENOUS

## 2012-08-14 MED ORDER — SODIUM CHLORIDE 0.9 % IV BOLUS (SEPSIS)
500.0000 mL | Freq: Once | INTRAVENOUS | Status: AC
  Administered 2012-08-14: 500 mL via INTRAVENOUS

## 2012-08-14 MED ORDER — HYDROCORTISONE SOD SUCCINATE 100 MG IJ SOLR
50.0000 mg | Freq: Four times a day (QID) | INTRAMUSCULAR | Status: DC
  Administered 2012-08-14 – 2012-08-22 (×31): 50 mg via INTRAVENOUS
  Filled 2012-08-14 (×35): qty 1

## 2012-08-14 MED ORDER — INSULIN ASPART 100 UNIT/ML ~~LOC~~ SOLN
0.0000 [IU] | SUBCUTANEOUS | Status: DC
  Administered 2012-08-14: 2 [IU] via SUBCUTANEOUS
  Administered 2012-08-14: 3 [IU] via SUBCUTANEOUS
  Administered 2012-08-14 – 2012-08-15 (×7): 2 [IU] via SUBCUTANEOUS
  Administered 2012-08-15 (×2): 3 [IU] via SUBCUTANEOUS
  Administered 2012-08-15: 2 [IU] via SUBCUTANEOUS
  Administered 2012-08-16: 1 [IU] via SUBCUTANEOUS
  Administered 2012-08-16: 2 [IU] via SUBCUTANEOUS
  Administered 2012-08-16 (×3): 1 [IU] via SUBCUTANEOUS
  Administered 2012-08-16: 2 [IU] via SUBCUTANEOUS
  Administered 2012-08-17: 1 [IU] via SUBCUTANEOUS
  Administered 2012-08-17 (×3): 2 [IU] via SUBCUTANEOUS
  Administered 2012-08-17: 1 [IU] via SUBCUTANEOUS
  Administered 2012-08-17: 2 [IU] via SUBCUTANEOUS
  Administered 2012-08-18 – 2012-08-19 (×5): 1 [IU] via SUBCUTANEOUS
  Administered 2012-08-19: 2 [IU] via SUBCUTANEOUS
  Administered 2012-08-19: 1 [IU] via SUBCUTANEOUS
  Administered 2012-08-19: 3 [IU] via SUBCUTANEOUS
  Administered 2012-08-20 (×2): 2 [IU] via SUBCUTANEOUS
  Administered 2012-08-20: 3 [IU] via SUBCUTANEOUS
  Administered 2012-08-20 – 2012-08-21 (×4): 2 [IU] via SUBCUTANEOUS
  Administered 2012-08-21: 3 [IU] via SUBCUTANEOUS
  Administered 2012-08-21: 1 [IU] via SUBCUTANEOUS
  Administered 2012-08-21: 2 [IU] via SUBCUTANEOUS
  Administered 2012-08-21: 1 [IU] via SUBCUTANEOUS
  Administered 2012-08-22: 3 [IU] via SUBCUTANEOUS

## 2012-08-14 MED ORDER — SODIUM CHLORIDE 0.9 % IV SOLN
INTRAVENOUS | Status: DC
  Administered 2012-08-14: 16:00:00 via INTRAVENOUS
  Administered 2012-08-22: 10 mL/h via INTRAVENOUS

## 2012-08-14 MED ORDER — POTASSIUM CHLORIDE 10 MEQ/50ML IV SOLN
10.0000 meq | INTRAVENOUS | Status: AC
  Administered 2012-08-14 (×4): 10 meq via INTRAVENOUS

## 2012-08-14 MED ORDER — SODIUM CHLORIDE 0.9 % IV SOLN
INTRAVENOUS | Status: DC
Start: 2012-08-14 — End: 2012-08-23
  Administered 2012-08-14: 16:00:00 via INTRAVENOUS

## 2012-08-14 MED ORDER — ALBUMIN HUMAN 5 % IV SOLN
25.0000 g | Freq: Once | INTRAVENOUS | Status: AC
  Administered 2012-08-14: 25 g via INTRAVENOUS
  Filled 2012-08-14 (×2): qty 250

## 2012-08-14 MED ORDER — NOREPINEPHRINE BITARTRATE 1 MG/ML IJ SOLN
2.0000 ug/min | INTRAVENOUS | Status: DC | PRN
  Filled 2012-08-14 (×2): qty 4

## 2012-08-14 MED ORDER — SODIUM CHLORIDE 0.9 % IV BOLUS (SEPSIS): 500.0000 mL | INTRAVENOUS | Status: DC | PRN

## 2012-08-14 MED ORDER — POTASSIUM CHLORIDE 10 MEQ/50ML IV SOLN
INTRAVENOUS | Status: AC
  Administered 2012-08-14: 10 meq via INTRAVENOUS
  Filled 2012-08-14: qty 250

## 2012-08-14 MED ORDER — DEXTROSE 5 % IV SOLN
2.0000 ug/min | INTRAVENOUS | Status: DC
  Administered 2012-08-14: 5 ug/min via INTRAVENOUS
  Filled 2012-08-14: qty 4

## 2012-08-14 NOTE — Progress Notes (Signed)
Dr. Molli Knock notified of low blood pressure no urine output and low CVP.  Orders received.  Will continue to monitor patient closely.

## 2012-08-14 NOTE — Progress Notes (Signed)
Nutrition Follow-up  Intervention:   1.  Enteral nutrition; continue current management as pt has reached goal TF meeting 100% of estimated kcal and protein needs with tolerance.  Assessment:   Pt admitted with generalized weakness and fatigue found to have respiratory insufficiency, SIRS, acute on chronic pancreatitis, and lower extremity edema.  Pt recently admitted for delirium from alcohol withdrawal and abdominal pain from incarcerated left inguinal hernia alleviated with surgical management.  Lipase     Component Value Date/Time   LIPASE 1558* 08/14/2012 1610    Patient currently intubated during respiratory insufficiency and is on ventilator support MV: 10.2 Temp (24hrs), Avg:97.9 F (36.6 C), Min:97.5 F (36.4 C), Max:98.3 F (36.8 C) Residuals: 5-10 mL q 4 hrs. On IVF.  Pt has resumed TF and has reached goal rate of Vital 1.2 @ 60 mL/hr to provide 1780 kcal, 108g protein, 1168 mL free water.  Pt dx with severe malnutrition of acute illness which is ongoing.  Diet Order:  NPO  Meds: Scheduled Meds:    . albuterol  2.5 mg Nebulization Q6H  . antiseptic oral rinse  15 mL Mouth Rinse QID  . chlorhexidine  15 mL Mouth/Throat BID  . enoxaparin (LOVENOX) injection  40 mg Subcutaneous Q24H  . [COMPLETED] fentaNYL  150 mcg Intravenous Once  . imipenem-cilastatin  500 mg Intravenous Q8H  . insulin aspart  0-9 Units Subcutaneous Q4H  . ketoconazole   Topical BID  . [COMPLETED] midazolam  4 mg Intravenous Once  . pantoprazole (PROTONIX) IV  40 mg Intravenous Q24H  . [COMPLETED] potassium chloride  40 mEq Per Tube Once  . [COMPLETED] potassium chloride      . sodium chloride  10-40 mL Intracatheter Q12H  . sodium chloride  3 mL Intravenous Q12H  . vancomycin  750 mg Intravenous Q12H  . [DISCONTINUED] insulin aspart  0-9 Units Subcutaneous TID WC   Continuous Infusions:    . feeding supplement (VITAL AF 1.2 CAL) 1,000 mL (08/14/12 0934)  .  sodium bicarbonate infusion  1000 mL 125 mL/hr at 08/13/12 1150   PRN Meds:.sodium chloride, fentaNYL, LORazepam, midazolam, ondansetron (ZOFRAN) IV, ondansetron, [DISCONTINUED] sodium chloride, [DISCONTINUED] albuterol, [DISCONTINUED] fentaNYL, [DISCONTINUED] guaiFENesin-dextromethorphan, [DISCONTINUED] LORazepam, [DISCONTINUED] sodium chloride   CMP     Component Value Date/Time   NA 138 08/14/2012 0512   K 3.9 08/14/2012 0512   CL 108 08/14/2012 0512   CO2 18* 08/14/2012 0512   GLUCOSE 203* 08/14/2012 0512   BUN 58* 08/14/2012 0512   CREATININE 1.67* 08/14/2012 0512   CALCIUM 7.7* 08/14/2012 0512   PROT 5.5* 08/14/2012 0512   ALBUMIN 1.2* 08/14/2012 0512   AST 109* 08/14/2012 0512   ALT 42 08/14/2012 0512   ALKPHOS 187* 08/14/2012 0512   BILITOT 1.7* 08/14/2012 0512   GFRNONAA 41* 08/14/2012 0512   GFRAA 47* 08/14/2012 0512    Magnesium  Date Value Range Status  08/12/2012 2.6* 1.5 - 2.5 mg/dL Final    Phosphorus  Date Value Range Status  08/12/2012 4.5  2.3 - 4.6 mg/dL Final    CBG (last 3)   Basename 08/14/12 0750 08/14/12 0402 08/14/12 0237  GLUCAP 180* 187* 202*     Intake/Output Summary (Last 24 hours) at 08/14/12 1131 Last data filed at 08/14/12 1000  Gross per 24 hour  Intake   8355 ml  Output    500 ml  Net   7855 ml  Last BM 11/13  Weight Status:  81.6 kg (11/15) -- trending up  Restatement  of needs:  1700-1800 kcals, 105-120 gm protein  Nutrition Dx:  Inadequate Oral Intake r/t acute on chronic pancreatitis as evidenced by NPO status, ongoing  Goal:  EN to meet >/= 90% of estimated nutrition needs, currently unmet  Monitor:  EN regimen & tolerance, respiratory status, weight, labs, I/O's  Loyce Dys, MS RD LDN Clinical Inpatient Dietitian Pager: [phone] Weekend/After hours pager: [phone]

## 2012-08-14 NOTE — Procedures (Signed)
Arterial Catheter Insertion Procedure Note Gary [name] [id number] 11-22-1945  Procedure: Insertion of Arterial Catheter  Indications: Blood pressure monitoring and Frequent blood sampling  Procedure Details Consent: Risks of procedure as well as the alternatives and risks of each were explained to the (patient/caregiver).  Consent for procedure obtained. Time Out: Verified patient identification, verified procedure, site/side was marked, verified correct patient position, special equipment/implants available, medications/allergies/relevent history reviewed, required imaging and test results available.  Performed  Maximum sterile technique was used including antiseptics, gloves, gown, hand hygiene, mask and sheet. Skin prep: Chlorhexidine; local anesthetic administered 20 gauge catheter was inserted into right radial artery using the Seldinger technique.  Evaluation Blood flow good; BP tracing good. Complications: No apparent complications Dr. Vassie [name] gave permission to place line in restricted Right Radial.   Gary [name] Gary [name] 08/14/2012

## 2012-08-14 NOTE — Progress Notes (Signed)
eLink Physician-Brief Progress Note Patient Name: Gary [name] DOB: 10/08/1945 MRN: [id number]  Date of Service  08/14/2012   HPI/Events of Note   The patient continues to be in shock, +18L, decreased urine out, pressor dependent; CVP 8.   eICU Interventions  Add sepsis phase II; insert aline, stress dose steroids and albumin.       Tanner Yeley 08/14/2012, 5:23 PM

## 2012-08-14 NOTE — Progress Notes (Signed)
PULMONARY  / CRITICAL CARE MEDICINE  Name: Gary [name] MRN: [id number] DOB: [birth year]    LOS: 4  REFERRING MD :  [name]  CHIEF COMPLAINT: encephalopathy, respiratory insufficiency  BRIEF PATIENT DESCRIPTION:  66 year old white male admitted 11/12 for abdominal pain in the setting of acute pancreatitis an incarcerated left inguinal hernia. Initially admitted to medicine service. Critical care asked to see on 11/15 for encephalopathy, and concern for progressive respiratory distress and inability to protect airway.  LINES / TUBES: right W. PICC line 11/14>>>  CULTURES: 11/13 urine culture: Negative. 11/12 blood culture x2>>>  ANTIBIOTICS: 11/12 levofloxacin: 1 dose. Zosyn 11/12>>>11/14 Primaxin 11/15>>> Vancomycin 11/12>>>11/14>>> 11/15>>>  SIGNIFICANT EVENTS:  CT abdomen 11/12: Enlargement of. Pancreatic fluid collection with significant mass effect on both right and left lobe of liver. Suggesting enlarging pseudocysts. Nonspecific nodularity edema of the mesentery with no focal mass. Stable left inguinal hernia. Abdominal ultrasound 11/12: gallstone with echogenic sludge, but no evidence of biliary obstruction or acute cholecystitis. Increased ascites compared to CT 7 days prior fluid around the liver with mass effect. ECHO 11/12: Systolic function was normal. The estimated ejection fraction was in the range of 50% to 55%. There was an increased relative contribution of atrial contraction to ventricular filling.   Bilateral LE ultrasound in 11/15>>>> LEVEL OF CARE:  Step down unit PRIMARY SERVICE:  [facility] CONSULTANTS:  PC CM CODE STATUS full code DIET:  N.p.o. DVT Px:  Subcutaneous heparin GI Px:    HISTORY OF PRESENT ILLNESS:   This is 66 year old male patient with a history of prostate cancer, hypertension, and alcohol abuse. Recently discharged from the hospital for alcohol withdrawal and abdominal pain secondary to incarcerated left inguinal hernia which was manually  reduced by surgery. He was discharged home with planned followup for potential elective hernia repair. Her spouse has had one alcoholic beverage since discharge. Presented to the emergency room on 11/12 with chief complaint of generalized weakness, and increasing fatigue. Also nonproductive cough. Also still complaining of abdominal pain both intermittently upper abdomen, and lower abdomen. Diagnostic evaluation was consistent with pancreatitis CT abdomen demonstrated what appears to be progressive pseudocyst he was admitted by medical services for supportive care. In addition he's had progressive bilateral lower extremity swelling and discomfort with erythema felt to be consistent with candida dermatitis. Critical care services were asked to see on 11/15, as the patient has become progressively lethargic, with poor cough mechanics, worsening abdominal pain, and decreased level of consciousness. Therapeutic interventions to date have included IV antibiotics, both surgical and GI consult services, as well as IV fluid resuscitation. Chest x-ray evaluation on 1112 demonstrates progressive bibasilar right greater than left volume loss.  VITAL SIGNS: Temp:  [97.5 F (36.4 C)-98.3 F (36.8 C)] 97.9 F (36.6 C) (11/16 0754) Pulse Rate:  [29-115] 112  (11/16 1017) Resp:  [15-25] 21  (11/16 1017) BP: (74-123)/(51-86) 74/51 mmHg (11/16 1017) SpO2:  [82 %-100 %] 100 % (11/16 1017) FiO2 (%):  [35 %-50 %] 40 % (11/16 1017) Weight:  [81.6 kg (179 lb 14.3 oz)-88 kg (194 lb 0.1 oz)] 88 kg (194 lb 0.1 oz) (11/16 0500) Room air HEMODYNAMICS: CVP:  [2 mmHg-13 mmHg] 4 mmHg VENTILATOR SETTINGS: Vent Mode:  [-] PSV;CPAP FiO2 (%):  [35 %-50 %] 40 % Set Rate:  [15 bmp] 15 bmp Vt Set:  [470 mL] 470 mL PEEP:  [5 cmH20] 5 cmH20 Pressure Support:  [5 cmH20] 5 cmH20 Plateau Pressure:  [19 cmH20] 19 cmH20 INTAKE / OUTPUT: Intake/Output  11/15 0701 - 11/16 0700 11/16 0701 - 11/17 0700   I.V. (mL/kg) 7450 (84.7) 435  (4.9)   NG/GT 760    IV Piggyback 500    Total Intake(mL/kg) 8710 (99) 435 (4.9)   Urine (mL/kg/hr) 625 (0.3)    Emesis/NG output     Total Output 625    Net +8085 +435        Urine Occurrence 1 x      PHYSICAL EXAMINATION: General:  Chronically ill-appearing 66 year old male appears older than stated age.  Neuro:  Sedated and intubated, following simple commands. HEENT:  Normal systolic, no JVD. Cardiovascular:  No murmur rub or gallop. Lungs: Coarse BS diffusely. Abdomen:  Distended, firm, generalized discomfort to palpation. Has shifting dullness to percussion. Positive bowel sounds. Musculoskeletal:  Generalized weakness but intact Skin:  Lower extremities are both swollen and edematous. Erythemic, warm to touch, and patient reports significant discomfort to gentle palpation bilaterally   LABS:  Lab 08/14/12 0512 08/14/12 0354 08/13/12 2007 08/13/12 1200 08/13/12 1130 08/13/12 0955 08/13/12 0454 08/12/12 0510 08/11/12 0425 August 25, 2012 1845  HGB 11.8* -- -- -- -- -- 12.3* 13.9 -- --  WBC 13.3* -- -- -- -- -- 16.7* 17.2* -- --  PLT 252 -- -- -- -- -- 303 243 -- --  NA 138 -- 137 139 -- -- -- -- -- --  K 3.9 -- 3.0* -- -- -- -- -- -- --  CL 108 -- 102 109 -- -- -- -- -- --  CO2 18* -- 26 17* -- -- -- -- -- --  GLUCOSE 203* -- 417* 140* -- -- -- -- -- --  BUN 58* -- 52* 54* -- -- -- -- -- --  CREATININE 1.67* -- 1.34 1.45* -- -- -- -- -- --  CALCIUM 7.7* -- 6.8* 8.2* -- -- -- -- -- --  MG -- -- -- -- -- -- -- 2.6* -- --  PHOS -- -- -- -- -- -- -- 4.5 -- --  AST 109* -- -- -- -- -- 70* 77* -- --  ALT 42 -- -- -- -- -- 34 39 -- --  ALKPHOS 187* -- -- -- -- -- 173* 125* -- --  BILITOT 1.7* -- -- -- -- -- 2.2* 2.4* -- --  PROT 5.5* -- -- -- -- -- 6.0 5.9* -- --  ALBUMIN 1.2* -- -- -- -- -- 1.5* 1.4* -- --  APTT -- -- -- -- -- -- -- -- -- --  INR -- -- -- 1.72* -- -- -- -- 1.65* 1.56*  LATICACIDVEN 2.9* -- -- -- -- -- -- -- -- --  TROPONINI -- -- -- -- -- -- -- -- -- --    PROCALCITON 4.36 -- -- -- 3.86 -- -- -- -- --  PROBNP -- -- -- -- -- -- -- -- -- --  O2SATVEN -- -- -- -- -- -- -- -- -- --  PHART -- 7.402 -- -- -- 7.416 -- -- -- --  PCO2ART -- 28.3* -- -- -- 23.9* -- -- -- --  PO2ART -- 98.6 -- -- -- 61.8* -- -- -- --    Lab 08/14/12 0750 08/14/12 0402 08/14/12 0237 08/13/12 2346 08/13/12 1956  GLUCAP 180* 187* 202* 173* 159*   Lipase     Component Value Date/Time   LIPASE 1558* 08/14/2012 0512    Intake/Output Summary (Last 24 hours) at 08/14/12 1040 Last data filed at 08/14/12 1000  Gross per 24 hour  Intake   8455  ml  Output    625 ml  Net   7830 ml   IMAGING: PCXR: Progressive bibasilar volume loss right greater than left consistent with worsening atelectasis  ECG:  DIAGNOSES: Principal Problem:  *Pancreatitis, alcoholic, acute Active Problems:  HTN (hypertension)  Alcohol abuse  Tobacco abuse  HCAP (healthcare-associated pneumonia)  ARF (acute renal failure)  Hyperkalemia  Hyponatremia  Hernia, inguinal  Cholelithiases  Leukocytosis  Pancreatic pseudocyst/cyst  Lower extremity pain, bilateral  Candidal dermatitis  Dehydration  Acute respiratory failure with hypoxia  Alcoholic liver damage  Nonspecific elevation of levels of transaminase or lactic acid dehydrogenase (LDH)   ASSESSMENT / PLAN:  PULMONARY  ASSESSMENT: Bibasilar right greater than left atelectasis Currently gas exchange and oxygenation are adequate. He has progression of bibasilar volume loss right greater than left. This could reflect healthcare associated pneumonia versus aspiration pneumonia,but would favor progressive atelectasis. He has a decent cough when requested, however not able to expectorate secretions. Certainly at risk for inability to protect airway. PLAN:   Intubated, begin PS trials. Titrate O2. Hydrate gently with bicarb drip. Start TF.  CARDIOVASCULAR  ASSESSMENT:  Systemic inflammatory response syndrome/Sirs and sepsis: in  the setting of pancreatitis with evolving pseudocyst. potential sources: being infected pseudocyst, lower extremity cellulitis. Also possible aspiration versus hospital associated pneumonia Lower extremity edema: Bilateral likely reflects cellulitis, however need to rule out deep vein thrombosis PLAN:  Continue broad-spectrum antibiotics. Continue IV hydration with sodium bicarb. Central venous pressure monitoring noted.  RENAL  ASSESSMENT:   Acute renal failure Probably multi-factorial in the setting of fluid volume depletion/dehydration, further complicated by ACE inhibitor antihypertensive therapy Metabolic acidosis: this appears to exhibit both positive and a non-gap properties PLAN:   Central venous pressure noted. Continue fluid volume resuscitation. Hold the ACE inhibitor. Renal dose medications. IV fluids D5 water with 3 amps of bicarbonate at 125.  GASTROINTESTINAL  ASSESSMENT:   Acute pancreatitis with pseudocyst: still in marked abdominal discomfort, progressive ascites, worsening abdominal distention. Tube feeds held currently due to concern for aspiration Incarcerated left inguinal hernia: surgery planned for future, but currently elective Cholelithiasis: no evidence of cholecystitis Abnormal LFTs in the setting of alcoholic hepatitis Protein calorie malnutrition PLAN:   GI and surgical services are following TF per nutrition. Continue empiric antibiotics. Will defer timing of neck CT scan to surgery.  HEMATOLOGIC  ASSESSMENT: Hemodilution related anemia: no current evidence of blood loss Coagulopathy: Mild, probably related to alcohol hepatitis PLAN:  Repeat coags Continue LMWH (h/o prostate CA) Transfuse for hemoglobin less than 7 and last he developed shock, then goal directed therapy protocol will drive transfusion trigger  INFECTIOUS  ASSESSMENT:   Sirs/sepsis: Multiple potential sources as follows: Infected pseudocyst, HCAP Versus aspiration pneumonia  and lower extremity cellulitis. Possible candida dermatitis both lower extremities. PLAN:   We will resume broad-spectrum antibiotics. Follow pending cultures. No DVT in lower ext noted on doppler. Continue Nizoral cream to lower extremities.  ENDOCRINE  ASSESSMENT:   hyperglycemia PLAN:   Cont CBG, currently not needing SSI.  NEUROLOGIC  ASSESSMENT:   Acute encephalopathy: Likely in the setting of sepsis. Additionally concerned about alcohol withdrawal PLAN:   PRN sedation only.  CLINICAL SUMMARY:  66 year old male patient admitted with abdominal pain and progressive weakness pancreatitis, left inguinal hernia (currently reduced), dehydration and renal failure. Initially admitted to the medical ward with supportive therapy including IV fluids, GI and surgical consultations, and empiric antibiotics. He's had progressive weakness, decreased level of consciousness, and now his ability  to adequately protect airway is in question. He currently has a decent cough when instructed to cough, however his chest x-ray demonstrates significant decreased right lung volumes. We will move him to the intensive care, initially we will try aggressive pulmonary hygiene measures, this will include bronchodilator therapy, NTS suctioning, and chest physiotherapy. Deeply concerned that he will require intubation given his decreased level of consciousness and profound weakness. We will re-institute antibiotics given concern for possible aspiration versus healthcare associated pneumonia. Check lactic acid to rule out occult sepsis. Also need to obtain lower extremity ultrasounds to be certain lower extremity swelling is not related to deep vein thrombosis. Mr. Vandunk condition is very guarded, with a very high likelihood he will require intubation in the next 24 hours.  I have personally obtained a history, examined the patient, evaluated laboratory and imaging results, formulated the assessment and plan and placed  orders.  CRITICAL CARE: The patient is critically ill with multiple organ systems failure and requires high complexity decision making for assessment and support, frequent evaluation and titration of therapies, application of advanced monitoring technologies and extensive interpretation of multiple databases. Critical Care Time devoted to patient care services described in this note is 35 minutes.   Alyson Reedy, M.D. [facility]. Pager: [phone]. After hours pager: [phone].

## 2012-08-14 NOTE — Progress Notes (Signed)
Notified Dr. Frederico Hamman of improved blood pressure with levophed but CVP still not within desired range and no urine output.  Orders received, will continue to monitor patient closely.

## 2012-08-14 NOTE — Progress Notes (Signed)
Subjective: Intubated yesterday  Objective: Vital signs in last 24 hours: Temp:  [97.5 F (36.4 C)-98.3 F (36.8 C)] 97.9 F (36.6 C) (11/16 0754) Pulse Rate:  [29-115] 112  (11/16 0800) Resp:  [15-25] 25  (11/16 0800) BP: (85-123)/(55-86) 89/59 mmHg (11/16 0800) SpO2:  [82 %-100 %] 99 % (11/16 0800) FiO2 (%):  [35 %-50 %] 40 % (11/16 0600) Weight:  [179 lb 14.3 oz (81.6 kg)-194 lb 0.1 oz (88 kg)] 194 lb 0.1 oz (88 kg) (11/16 0500) Last BM Date: 08/11/12  Intake/Output from previous day: 11/15 0701 - 11/16 0700 In: 8565 [I.V.:7305; NG/GT:760; IV Piggyback:500] Out: 625 [Urine:625] Intake/Output this shift:    GI: moderately distended, lih reducible  Lab Results:   [facility] 08/14/12 0512 08/13/12 0454  WBC 13.3* 16.7*  HGB 11.8* 12.3*  HCT 33.9* 35.1*  PLT 252 303   BMET  Basename 08/14/12 0512 08/13/12 2007  NA 138 137  K 3.9 3.0*  CL 108 102  CO2 18* 26  GLUCOSE 203* 417*  BUN 58* 52*  CREATININE 1.67* 1.34  CALCIUM 7.7* 6.8*   PT/INR  Basename 08/13/12 1200  LABPROT 19.6*  INR 1.72*   ABG  Basename 08/14/12 0354 08/13/12 0955  PHART 7.402 7.416  HCO3 17.4* 15.1*    Studies/Results: Dg Abd 1 View  08/12/2012  *RADIOLOGY REPORT*  Clinical Data: Feeding tube placement  ABDOMEN - 1 VIEW  Comparison: 08/12/2012  Findings: NG tube within the stomach, tip is within the distal antropyloric channel.  IMPRESSION: NG tube in the distal stomach.   Original Report Authenticated By: Judie Petit. Miles Costain, M.D.    Dg Chest Port 1 View  08/13/2012  *RADIOLOGY REPORT*  Clinical Data: Line placement.  PORTABLE CHEST - 1 VIEW  Comparison: Portable film at 1257 hours earlier today.  Findings: Endotracheal tube has been withdrawn slightly, now 2.5 cm above the carina.  PICC line tip appears slightly low, right atrium, the recommend withdrawal 4 cm.  Continued volume loss and opacity, worse at the right base.  IMPRESSION: ET tube 2.5 cm above carina.  PICC line tip slightly low,  consider withdrawal 4 cm.   Original Report Authenticated By: Davonna Belling, M.D.    Dg Chest Port 1 View  08/13/2012  *RADIOLOGY REPORT*  Clinical Data: Intubation  PORTABLE CHEST - 1 VIEW  Comparison: Portable exam hours compared to 0931 hours  Findings: Rotated to the left. Carina is poorly visualized. Tip of endotracheal tube 9 mm above approximate position of the carina. Nasogastric tube extends into stomach. Low lung volumes with bibasilar atelectasis. Right arm PICC line, tip projecting over cavoatrial junction. No upper lungs clear.  IMPRESSION: Low lung volumes with bibasilar atelectasis. Tip of endotracheal tube is 9 mm above approximate position of the carina, recommend withdrawal 1-2 cm.  Findings called to [facility] on 2900 on 08/13/2012 at 1316 hours.   Original Report Authenticated By: Ulyses Southward, M.D.    Dg Chest Port 1 View  08/13/2012  *RADIOLOGY REPORT*  Clinical Data: Hypoxia  PORTABLE CHEST - 1 VIEW  Comparison: 11/12  Findings: Nasogastric tube enters the abdomen.  Right arm PICC has its tip just above the SVC/RA junction.  There is chronic elevation of the right hemidiaphragm.  There is worsened atelectasis/infiltrate in both lower lungs.  Upper lobes remain clear.  IMPRESSION: Nasogastric tube and right arm PICC well positioned.  Worsening volume loss/infiltrate in the lower lungs.   Original Report Authenticated By: Paulina Fusi, M.D.    Dg  Abd Portable 2v  08/12/2012  *RADIOLOGY REPORT*  Clinical Data: Abdominal pain.  Pancreatitis.  Question ileus.  PORTABLE ABDOMEN - 2 VIEW  Comparison: 08/09/2012 CT.  Findings: Nonspecific bowel gas pattern without plain film evidence of bowel obstruction or free intraperitoneal air.  CT noted loculated perihepatic fluid not well delineated by plain film exam.  IMPRESSION: Nonspecific bowel gas pattern without plain film evidence of bowel obstruction or free intraperitoneal air.  CT noted loculated perihepatic fluid not well delineated by plain  film exam.   Original Report Authenticated By: Lacy Duverney, M.D.    Dg Naso G Tube Plc W/fl-no Rad  08/12/2012  CLINICAL DATA: place NG for feeds- prefer small bore   NASO G TUBE PLACEMENT WITH FLUORO  Fluoroscopy was utilized by the requesting physician.  No radiographic  interpretation.      Anti-infectives: Anti-infectives     Start     Dose/Rate Route Frequency Ordered Stop   08/13/12 1400   vancomycin (VANCOCIN) 750 mg in sodium chloride 0.9 % 150 mL IVPB        750 mg 150 mL/hr over 60 Minutes Intravenous Every 12 hours 08/13/12 1254     08/13/12 1400   imipenem-cilastatin (PRIMAXIN) 500 mg in sodium chloride 0.9 % 100 mL IVPB        500 mg 200 mL/hr over 30 Minutes Intravenous 3 times per day 08/13/12 1254     08/12/12 1700   levofloxacin (LEVAQUIN) IVPB 750 mg  Status:  Discontinued        750 mg 100 mL/hr over 90 Minutes Intravenous Every 48 hours 08/03/2012 1749 08/12/12 1130   08/12/12 0600   vancomycin (VANCOCIN) IVPB 1000 mg/200 mL premix  Status:  Discontinued        1,000 mg 200 mL/hr over 60 Minutes Intravenous Every 24 hours 08/11/12 1141 08/12/12 1130   08/08/2012 2200   piperacillin-tazobactam (ZOSYN) IVPB 3.375 g  Status:  Discontinued        3.375 g 12.5 mL/hr over 240 Minutes Intravenous Every 8 hours 08/09/2012 1749 08/12/12 1130   08/13/2012 1800   vancomycin (VANCOCIN) 750 mg in sodium chloride 0.9 % 150 mL IVPB  Status:  Discontinued        750 mg 150 mL/hr over 60 Minutes Intravenous Every 12 hours 08/12/2012 1749 08/11/12 1141   08/21/2012 1530   vancomycin (VANCOCIN) IVPB 1000 mg/200 mL premix  Status:  Discontinued        1,000 mg 200 mL/hr over 60 Minutes Intravenous  Once 08/21/2012 1528 08/17/2012 1808   08/09/2012 1530   piperacillin-tazobactam (ZOSYN) IVPB 3.375 g        3.375 g 100 mL/hr over 30 Minutes Intravenous  Once 08/28/2012 1528 08/05/2012 1630   08/09/2012 1530   levofloxacin (LEVAQUIN) IVPB 750 mg  Status:  Discontinued        750 mg 100 mL/hr over  90 Minutes Intravenous  Once 08/04/2012 1528 08/19/2012 1808          Assessment/Plan: Acute pancreatitis, ? etoh vs gallstones LIH  Discussed with family role of surgery at later date both for hernia and gallstones.  Will continue to follow for now  [facility] 08/14/2012

## 2012-08-14 NOTE — Progress Notes (Signed)
Dr. Molli Knock notified of continued low blood pressure despite fluid boluses.  Orders received.  Will continue to monitor patient closely.

## 2012-08-14 NOTE — Progress Notes (Signed)
Patient ID: Gary [name], male   DOB: 1946-03-05, 66 y.o.   MRN: [id number] [facility] Progress Note  Subjective: Intubated,and hypotensive despite several fluid boluses today- Bp systolic 70's-80's, starting pressore Obtunded currently  Objective:  Vital signs in last 24 hours: Temp:  [97.5 F (36.4 C)-98.3 F (36.8 C)] 97.9 F (36.6 C) (11/16 0754) Pulse Rate:  [29-115] 112  (11/16 1515) Resp:  [15-25] 21  (11/16 1515) BP: (73-117)/(47-86) 88/63 mmHg (11/16 1515) SpO2:  [82 %-100 %] 99 % (11/16 1515) FiO2 (%):  [40 %-50 %] 40 % (11/16 1218) Weight:  [194 lb 0.1 oz (88 kg)] 194 lb 0.1 oz (88 kg) (11/16 0500) Last BM Date: 08/14/12 General:   Alert,  Well-developed, chronically ill appearing OG tube, nonbloody, nasal panda Heart:  Regular rate and rhythm; no murmurs Pulm;coarse BS bilaterally, intubated Abdomen: distended ,tight, diffusely tender, BS quiet  Extremities:  2+ edema lower EXT weeping Neurologic: intubated, no verbal response at present, though has been alert Intake/Output from previous day: 11/15 0701 - 11/16 0700 In: 8870 [I.V.:7450; NG/GT:820; IV Piggyback:600] Out: 625 [Urine:625] Intake/Output this shift: Total I/O In: 4040 [I.V.:3310; NG/GT:480; IV Piggyback:250] Out: 6 [Urine:6]  Lab Results:  [facility] 08/14/12 0512 08/13/12 0454 08/12/12 0510  WBC 13.3* 16.7* 17.2*  HGB 11.8* 12.3* 13.9  HCT 33.9* 35.1* 39.9  PLT 252 303 243   BMET  Basename 08/14/12 1207 08/14/12 0512 08/13/12 2007  NA 139 138 137  K 3.1* 3.9 3.0*  CL 101 108 102  CO2 32 18* 26  GLUCOSE 533* 203* 417*  BUN 54* 58* 52*  CREATININE 1.68* 1.67* 1.34  CALCIUM 6.5* 7.7* 6.8*   LFT  Basename 08/14/12 0512  PROT 5.5*  ALBUMIN 1.2*  AST 109*  ALT 42  ALKPHOS 187*  BILITOT 1.7*  BILIDIR --  IBILI --   PT/INR  Basename 08/13/12 1200  LABPROT 19.6*  INR 1.72*      Assessment / Plan: #1   66 yo male  With severe acute on chronic pancreatitis (etoh<  and also has gallstones) ,now with multisystem failure Intubated, and  Not responding to volume replacement with persistent hypotension. Levophed being started On Primaxin, Vanco Management per CCM,nothing to add from GI standpoint today #2 Enlarging pseudocysts on last Ct 11/12- noncontrasted Principal Problem:  *Pancreatitis, alcoholic, acute Active Problems:  HTN (hypertension)  Alcohol abuse  Tobacco abuse  HCAP (healthcare-associated pneumonia)  ARF (acute renal failure)  Hyperkalemia  Hyponatremia  Hernia, inguinal  Cholelithiases  Leukocytosis  Pancreatic pseudocyst/cyst  Lower extremity pain, bilateral  Candidal dermatitis  Dehydration  Acute respiratory failure with hypoxia  Alcoholic liver damage  Nonspecific elevation of levels of transaminase or lactic acid dehydrogenase (LDH)     LOS: 4 days   Gary [name]  08/14/2012, 3:28 PM    GI ATTENDING  Interval history and labs reviewed. Patient seen and examined. Cream as above as outlined. Doing poorly with severe pancreatitis and multisystem organ failure. Continues to be hypotensive. On antibiotics. Now intubated. Continue with maximal supportive care. May need repeat imaging of the pancreas, though renal function not optimal at present.  Gary [name]. [name]., M.D. [facility] Division of Gastroenterology

## 2012-08-15 ENCOUNTER — Inpatient Hospital Stay (HOSPITAL_COMMUNITY): Payer: Medicare Other

## 2012-08-15 LAB — POCT I-STAT 3, ART BLOOD GAS (G3+)
Acid-base deficit: 5 mmol/L — ABNORMAL HIGH (ref 0.0–2.0)
Bicarbonate: 21.8 mEq/L (ref 20.0–24.0)
Bicarbonate: 24.3 mEq/L — ABNORMAL HIGH (ref 20.0–24.0)
Patient temperature: 98.5
TCO2: 23 mmol/L (ref 0–100)
TCO2: 26 mmol/L (ref 0–100)
pCO2 arterial: 41 mmHg (ref 35.0–45.0)
pH, Arterial: 7.296 — ABNORMAL LOW (ref 7.350–7.450)
pH, Arterial: 7.381 (ref 7.350–7.450)
pO2, Arterial: 68 mmHg — ABNORMAL LOW (ref 80.0–100.0)

## 2012-08-15 LAB — RENAL FUNCTION PANEL
Albumin: 1.2 g/dL — ABNORMAL LOW (ref 3.5–5.2)
BUN: 70 mg/dL — ABNORMAL HIGH (ref 6–23)
CO2: 21 mEq/L (ref 19–32)
Chloride: 102 mEq/L (ref 96–112)
GFR calc non Af Amer: 24 mL/min — ABNORMAL LOW (ref >90)
Potassium: 4 mEq/L (ref 3.5–5.1)

## 2012-08-15 LAB — CBC
HCT: 32.9 % — ABNORMAL LOW (ref 39.0–52.0)
MCH: 35.9 pg — ABNORMAL HIGH (ref 26.0–34.0)
MCV: 102.8 fL — ABNORMAL HIGH (ref 78.0–100.0)
Platelets: 220 10*3/uL (ref 150–400)
RBC: 3.2 MIL/uL — ABNORMAL LOW (ref 4.22–5.81)
RDW: 15.9 % — ABNORMAL HIGH (ref 11.5–15.5)

## 2012-08-15 LAB — BASIC METABOLIC PANEL
CO2: 20 mEq/L (ref 19–32)
CO2: 23 mEq/L (ref 19–32)
Calcium: 7.9 mg/dL — ABNORMAL LOW (ref 8.4–10.5)
Calcium: 7.6 mg/dL — ABNORMAL LOW (ref 8.4–10.5)
Chloride: 103 mEq/L (ref 96–112)
Creatinine, Ser: 2.35 mg/dL — ABNORMAL HIGH (ref 0.50–1.35)
Creatinine, Ser: 2.63 mg/dL — ABNORMAL HIGH (ref 0.50–1.35)
Glucose, Bld: 219 mg/dL — ABNORMAL HIGH (ref 70–99)
Glucose, Bld: 223 mg/dL — ABNORMAL HIGH (ref 70–99)

## 2012-08-15 LAB — AMMONIA: Ammonia: 70 umol/L — ABNORMAL HIGH (ref 11–60)

## 2012-08-15 LAB — GLUCOSE, CAPILLARY
Glucose-Capillary: 216 mg/dL — ABNORMAL HIGH (ref 70–99)
Glucose-Capillary: 229 mg/dL — ABNORMAL HIGH (ref 70–99)
Glucose-Capillary: 198 mg/dL — ABNORMAL HIGH (ref 70–99)

## 2012-08-15 LAB — PROTIME-INR: INR: 1.35 (ref 0.00–1.49)

## 2012-08-15 MED ORDER — IMIPENEM-CILASTATIN 250 MG IV SOLR
250.0000 mg | Freq: Four times a day (QID) | INTRAVENOUS | Status: DC
  Administered 2012-08-15 – 2012-08-18 (×14): 250 mg via INTRAVENOUS
  Filled 2012-08-15 (×16): qty 250

## 2012-08-15 MED ORDER — PRISMASOL BGK 4/2.5 32-4-2.5 MEQ/L IV SOLN
INTRAVENOUS | Status: DC
  Administered 2012-08-15 – 2012-08-21 (×25): via INTRAVENOUS_CENTRAL
  Filled 2012-08-15 (×36): qty 5000

## 2012-08-15 MED ORDER — PRISMASOL BGK 4/2.5 32-4-2.5 MEQ/L IV SOLN
INTRAVENOUS | Status: DC
  Administered 2012-08-15 – 2012-08-19 (×7): via INTRAVENOUS_CENTRAL
  Filled 2012-08-15 (×7): qty 5000

## 2012-08-15 MED ORDER — ALBUTEROL SULFATE (5 MG/ML) 0.5% IN NEBU
2.5000 mg | INHALATION_SOLUTION | RESPIRATORY_TRACT | Status: DC | PRN
  Administered 2012-08-15: 2.5 mg via RESPIRATORY_TRACT

## 2012-08-15 MED ORDER — VASOPRESSIN 20 UNIT/ML IJ SOLN
0.0300 [IU]/min | INTRAVENOUS | Status: DC
  Administered 2012-08-15 – 2012-08-22 (×5): 0.03 [IU]/min via INTRAVENOUS
  Filled 2012-08-15 (×5): qty 2.5

## 2012-08-15 MED ORDER — HEPARIN SODIUM (PORCINE) 1000 UNIT/ML DIALYSIS
1000.0000 [IU] | INTRAMUSCULAR | Status: DC | PRN
  Administered 2012-08-17: 2800 [IU] via INTRAVENOUS_CENTRAL
  Filled 2012-08-15: qty 2
  Filled 2012-08-15: qty 6
  Filled 2012-08-15: qty 4
  Filled 2012-08-15: qty 1

## 2012-08-15 MED ORDER — PRISMASOL BGK 4/2.5 32-4-2.5 MEQ/L IV SOLN
INTRAVENOUS | Status: DC
  Administered 2012-08-15 – 2012-08-19 (×4): via INTRAVENOUS_CENTRAL
  Filled 2012-08-15 (×6): qty 5000

## 2012-08-15 MED ORDER — HEPARIN SODIUM (PORCINE) 5000 UNIT/ML IJ SOLN
5000.0000 [IU] | Freq: Three times a day (TID) | INTRAMUSCULAR | Status: DC
  Administered 2012-08-15 – 2012-08-16 (×4): 5000 [IU] via SUBCUTANEOUS
  Filled 2012-08-15 (×6): qty 1

## 2012-08-15 MED ORDER — HEPARIN (PORCINE) 2000 UNITS/L FOR CRRT
INTRAVENOUS_CENTRAL | Status: DC | PRN
  Filled 2012-08-15: qty 1000

## 2012-08-15 NOTE — Progress Notes (Signed)
Patient ID: Gary [name], male   DOB: [birth year], 66 y.o.   MRN: [id number]     Notes reviewed,events of past 24 hours noted. Multisystem oragn failure with progressive renal failure. Critically ill- he remains intubated,on pressors, and is to have dialysis later today. Severe alcohol induced pancreatitis  With enlarging fluid collections. Multiple consultants following and we are not offering anything to CCM management . We will be available if can help in the coming days.   [name]. [name]., M.D. [facility] Division of Gastroenterology

## 2012-08-15 NOTE — Progress Notes (Signed)
ANTIBIOTIC CONSULT NOTE - FOLLOW UP  Pharmacy Consult for imipenem/vancomycin Indication: rule out pneumonia, r/o sepsis, pancreatitis, cellulitis  No Known Allergies  Patient Measurements: Height: 5\' 4"  (162.6 cm) Weight: 208 lb 1.8 oz (94.4 kg) IBW/kg (Calculated) : 59.2   Vital Signs: Temp: 96 F (35.6 C) (11/17 0804) Temp src: Axillary (11/17 0804) BP: 100/60 mmHg (11/17 0800) Pulse Rate: 108  (11/17 0800) Intake/Output from previous day: 11/16 0701 - 11/17 0700 In: 9214.5 [I.V.:6964.5; NG/GT:1440; IV Piggyback:810] Out: 49 [Urine:49] Intake/Output from this shift: Total I/O In: 566.3 [I.V.:446.3; NG/GT:120] Out: 5 [Urine:5]  Labs:  [facility] 08/15/12 0355 08/14/12 2007 08/14/12 1207 08/14/12 0512 08/13/12 0454  WBC 17.2* -- -- 13.3* 16.7*  HGB 11.5* -- -- 11.8* 12.3*  PLT 220 -- -- 252 303  LABCREA -- -- -- -- --  CREATININE 2.35* 2.13* 1.68* -- --   Estimated Creatinine Clearance: 31.6 ml/min (by C-G formula based on Cr of 2.35). No results found for this basename: VANCOTROUGH:2,VANCOPEAK:2,VANCORANDOM:2,GENTTROUGH:2,GENTPEAK:2,GENTRANDOM:2,TOBRATROUGH:2,TOBRAPEAK:2,TOBRARND:2,AMIKACINPEAK:2,AMIKACINTROU:2,AMIKACIN:2, in the last 72 hours    Assessment: 66 yo male on septic shock protocol and noted with possible PNA, pancreatitis with evolving pseudocyst and lower extremity cellulitis. SCr up since 11/16 and no UOP noted. Patient on imipenem and vancomycin for coverage.  11/13 urine culture: Negative.  11/12 blood culture x2>>> ngtd  Imipenem 11/15> Vanc 11/12 >>11/14>> 11/15>  Zosyn 11/12 >>11/15 Levaquin 11/12 >> 11/13  Goal of Therapy:  Vancomycin trough level 15-20 mcg/ml  Plan:  -Hold vancomycin for now as I anticipate accumulation -Vancomycin random in am -Change Imipenem to 250mg  q6h -Will follow cultures and renal function  Harland German, Pharm D 08/15/2012 11:23 AM

## 2012-08-15 NOTE — Progress Notes (Signed)
PULMONARY  / CRITICAL CARE MEDICINE  Name: Gary [name] MRN: [id number] DOB: Mar 13, 1946    LOS: 5  REFERRING MD :  [name]  CHIEF COMPLAINT: encephalopathy, respiratory insufficiency  BRIEF PATIENT DESCRIPTION:  66 year old white male admitted 11/12 for abdominal pain in the setting of acute pancreatitis an incarcerated left inguinal hernia. Initially admitted to medicine service. Critical care asked to see on 11/15 for encephalopathy, and concern for progressive respiratory distress and inability to protect airway.  LINES / TUBES: right W. PICC line 11/14>>>  CULTURES: 11/13 urine culture: Negative. 11/12 blood culture x2>>>  ANTIBIOTICS: 11/12 levofloxacin: 1 dose. Zosyn 11/12>>>11/14 Primaxin 11/15>>> Vancomycin 11/12>>>11/14>>> 11/15>>>  SIGNIFICANT EVENTS:  CT abdomen 11/12: Enlargement of. Pancreatic fluid collection with significant mass effect on both right and left lobe of liver. Suggesting enlarging pseudocysts. Nonspecific nodularity edema of the mesentery with no focal mass. Stable left inguinal hernia. Abdominal ultrasound 11/12: gallstone with echogenic sludge, but no evidence of biliary obstruction or acute cholecystitis. Increased ascites compared to CT 7 days prior fluid around the liver with mass effect. ECHO 11/12: Systolic function was normal. The estimated ejection fraction was in the range of 50% to 55%. There was an increased relative contribution of atrial contraction to ventricular filling.   Bilateral LE ultrasound in 11/15>>>> LEVEL OF CARE:  Step down unit PRIMARY SERVICE:  [facility] CONSULTANTS:  PC CM CODE STATUS full code DIET:  N.p.o. DVT Px:  Subcutaneous heparin GI Px:    HISTORY OF PRESENT ILLNESS:   This is 66 year old male patient with a history of prostate cancer, hypertension, and alcohol abuse. Recently discharged from the hospital for alcohol withdrawal and abdominal pain secondary to incarcerated left inguinal hernia which was manually  reduced by surgery. He was discharged home with planned followup for potential elective hernia repair. Her spouse has had one alcoholic beverage since discharge. Presented to the emergency room on 11/12 with chief complaint of generalized weakness, and increasing fatigue. Also nonproductive cough. Also still complaining of abdominal pain both intermittently upper abdomen, and lower abdomen. Diagnostic evaluation was consistent with pancreatitis CT abdomen demonstrated what appears to be progressive pseudocyst he was admitted by medical services for supportive care. In addition he's had progressive bilateral lower extremity swelling and discomfort with erythema felt to be consistent with candida dermatitis. Critical care services were asked to see on 11/15, as the patient has become progressively lethargic, with poor cough mechanics, worsening abdominal pain, and decreased level of consciousness. Therapeutic interventions to date have included IV antibiotics, both surgical and GI consult services, as well as IV fluid resuscitation. Chest x-ray evaluation on 1112 demonstrates progressive bibasilar right greater than left volume loss.  VITAL SIGNS: Temp:  [96 F (35.6 C)-98.9 F (37.2 C)] 96 F (35.6 C) (11/17 0804) Pulse Rate:  [94-119] 108  (11/17 0800) Resp:  [12-24] 19  (11/17 0800) BP: (73-130)/(47-82) 100/60 mmHg (11/17 0800) SpO2:  [88 %-100 %] 88 % (11/17 0900) Arterial Line BP: (95-117)/(55-63) 105/58 mmHg (11/17 0800) FiO2 (%):  [40 %-65 %] 65 % (11/17 0900) Weight:  [94.4 kg (208 lb 1.8 oz)] 94.4 kg (208 lb 1.8 oz) (11/17 0500) Room air HEMODYNAMICS: CVP:  [5 mmHg-22 mmHg] 18 mmHg VENTILATOR SETTINGS: Vent Mode:  [-] PRVC FiO2 (%):  [40 %-65 %] 65 % Set Rate:  [15 bmp] 15 bmp Vt Set:  [470 mL] 470 mL PEEP:  [5 cmH20] 5 cmH20 Pressure Support:  [8 cmH20] 8 cmH20 Plateau Pressure:  [18 cmH20-22 cmH20] 22 cmH20  INTAKE / OUTPUT: Intake/Output      11/16 0701 - 11/17 0700 11/17 0701 -  11/18 0700   I.V. (mL/kg) 6964.5 (73.8) 476.3 (5)   NG/GT 1440 120   IV Piggyback 810    Total Intake(mL/kg) 9214.5 (97.6) 596.3 (6.3)   Urine (mL/kg/hr) 49 (0) 5   Total Output 49 5   Net +9165.5 +591.3        Stool Occurrence 1 x      PHYSICAL EXAMINATION: General:  Chronically ill-appearing 66 year old male appears older than stated age.  Neuro:  Sedated and intubated, following simple commands. HEENT:  Normal systolic, no JVD. Cardiovascular:  No murmur rub or gallop. Lungs: Coarse BS diffusely. Abdomen:  Distended, firm, generalized discomfort to palpation. Has shifting dullness to percussion. Positive bowel sounds. Musculoskeletal:  Generalized weakness but intact Skin:  Lower extremities are both swollen and edematous. Erythemic, warm to touch, and patient reports significant discomfort to gentle palpation bilaterally   LABS:  Lab 08/15/12 0356 08/15/12 0355 08/14/12 2007 08/14/12 1207 08/14/12 0512 08/14/12 0354 08/13/12 1200 08/13/12 1130 08/13/12 0955 08/13/12 0454 08/12/12 0510 08/11/12 0425 08/13/2012 1845  HGB -- 11.5* -- -- 11.8* -- -- -- -- 12.3* -- -- --  WBC -- 17.2* -- -- 13.3* -- -- -- -- 16.7* -- -- --  PLT -- 220 -- -- 252 -- -- -- -- 303 -- -- --  NA -- 136 140 139 -- -- -- -- -- -- -- -- --  K -- 4.5 3.9 -- -- -- -- -- -- -- -- -- --  CL -- 103 106 101 -- -- -- -- -- -- -- -- --  CO2 -- 20 20 32 -- -- -- -- -- -- -- -- --  GLUCOSE -- 219* 181* 533* -- -- -- -- -- -- -- -- --  BUN -- 65* 61* 54* -- -- -- -- -- -- -- -- --  CREATININE -- 2.35* 2.13* 1.68* -- -- -- -- -- -- -- -- --  CALCIUM -- 7.9* 7.8* 6.5* -- -- -- -- -- -- -- -- --  MG -- 2.5 -- -- -- -- -- -- -- -- 2.6* -- --  PHOS -- 4.7* -- -- -- -- -- -- -- -- 4.5 -- --  AST -- -- -- -- 109* -- -- -- -- 70* 77* -- --  ALT -- -- -- -- 42 -- -- -- -- 34 39 -- --  ALKPHOS -- -- -- -- 187* -- -- -- -- 173* 125* -- --  BILITOT -- -- -- -- 1.7* -- -- -- -- 2.2* 2.4* -- --  PROT -- -- -- -- 5.5* -- -- --  -- 6.0 5.9* -- --  ALBUMIN -- -- -- -- 1.2* -- -- -- -- 1.5* 1.4* -- --  APTT -- -- -- -- -- -- -- -- -- -- -- -- --  INR -- -- -- -- -- -- 1.72* -- -- -- -- 1.65* 1.56*  LATICACIDVEN -- -- -- -- 2.9* -- -- -- -- -- -- -- --  TROPONINI -- -- -- -- -- -- -- -- -- -- -- -- --  PROCALCITON -- 5.71 -- -- 4.36 -- -- 3.86 -- -- -- -- --  PROBNP -- -- -- -- -- -- -- -- -- -- -- -- --  O2SATVEN -- -- -- -- -- -- -- -- -- -- -- -- --  PHART 7.296* -- -- -- -- 7.402 -- -- 7.416 -- -- -- --  PCO2ART 44.7 -- -- -- -- 28.3* -- -- 23.9* -- -- -- --  PO2ART 63.0* -- -- -- -- 98.6 -- -- 61.8* -- -- -- --    Lab 08/15/12 0800 08/15/12 0003 08/14/12 2006 08/14/12 1617 08/14/12 1156  GLUCAP 196* 192* 184* 180* 178*   Lipase     Component Value Date/Time   LIPASE 1558* 08/14/2012 0512    Intake/Output Summary (Last 24 hours) at 08/15/12 1038 Last data filed at 08/15/12 1000  Gross per 24 hour  Intake 9075.75 ml  Output     51 ml  Net 9024.75 ml   IMAGING: PCXR: Progressive bibasilar volume loss right greater than left consistent with worsening atelectasis  ECG:  DIAGNOSES: Principal Problem:  *Pancreatitis, alcoholic, acute Active Problems:  HTN (hypertension)  Alcohol abuse  Tobacco abuse  HCAP (healthcare-associated pneumonia)  ARF (acute renal failure)  Hyperkalemia  Hyponatremia  Hernia, inguinal  Cholelithiases  Leukocytosis  Pancreatic pseudocyst/cyst  Lower extremity pain, bilateral  Candidal dermatitis  Dehydration  Acute respiratory failure with hypoxia  Alcoholic liver damage  Nonspecific elevation of levels of transaminase or lactic acid dehydrogenase (LDH)  ASSESSMENT / PLAN:  PULMONARY  ASSESSMENT: Bibasilar right greater than left atelectasis Currently gas exchange and oxygenation are adequate. He has progression of bibasilar volume loss right greater than left. This could reflect healthcare associated pneumonia versus aspiration pneumonia,but would favor  progressive atelectasis. He has a decent cough when requested, however not able to expectorate secretions. Certainly at risk for inability to protect airway.  Massive right sided pleural effusion. PLAN:   Increase PEEP to 10 and FiO2 to 60%. KVO all IVF except for bicarb drip. Hold TF.   CARDIOVASCULAR  ASSESSMENT:  Systemic inflammatory response syndrome/Sirs and sepsis: in the setting of pancreatitis with evolving pseudocyst. potential sources: being infected pseudocyst, lower extremity cellulitis. Also possible aspiration versus hospital associated pneumonia Lower extremity edema: Bilateral likely reflects cellulitis, however need to rule out deep vein thrombosis PLAN:  Continue broad-spectrum antibiotics. Continue IV hydration with sodium bicarb. Central venous pressure monitoring noted. Will need a thora if unable to oxygenate and ventilate. Will need para as well if patient continues to deteriorate but would rather see if dialysis is an option prior to introducing too much fluid shifts.  RENAL  ASSESSMENT:   Acute renal failure Probably multi-factorial but hepato-renal syndrome seems to be most likely. Metabolic acidosis: Acidosis worsening, UOP rapidly deteriorating. PLAN:   Central venous pressure 18 cmH2O. KVO IVF except for bicarb drip. Hold the ACE inhibitor. Renal dose medications. Renal consult called.  GASTROINTESTINAL  ASSESSMENT:   Acute pancreatitis with pseudocyst: still in marked abdominal discomfort, progressive ascites, worsening abdominal distention. Tube feeds held currently due to concern for aspiration Incarcerated left inguinal hernia: surgery planned for future, but currently elective Cholelithiasis: no evidence of cholecystitis Abnormal LFTs in the setting of alcoholic hepatitis Protein calorie malnutrition PLAN:   GI and surgical services are following Hold TF due to residual and fluid overload. Continue empiric antibiotics. Will defer timing  of neck CT scan to surgery but more importantly when more stable.  HEMATOLOGIC  ASSESSMENT: Hemodilution related anemia: no current evidence of blood loss Coagulopathy: Mild, probably related to alcohol hepatitis PLAN:  Repeat coags. D/C LMWH and start heparin 5000 units sq q8 hours. Transfuse for hemoglobin less than 7 and last he developed shock, then goal directed therapy protocol will drive transfusion trigger.  INFECTIOUS  ASSESSMENT:   Sirs/sepsis: Multiple potential sources as follows: Infected  pseudocyst, HCAP Versus aspiration pneumonia and lower extremity cellulitis. Possible candida dermatitis both lower extremities. PLAN:   We will resume broad-spectrum antibiotics. Follow pending cultures. No DVT in lower ext noted on doppler. Continue Nizoral cream to lower extremities. Continue primaxin and vanc.  ENDOCRINE  ASSESSMENT:   hyperglycemia PLAN:   Cont CBG, currently not needing SSI.  NEUROLOGIC  ASSESSMENT:   Acute encephalopathy: Likely in the setting of sepsis. Additionally concerned about alcohol withdrawal PLAN:   PRN sedation only.  CLINICAL SUMMARY:  66 year old male patient admitted with abdominal pain and progressive weakness pancreatitis, left inguinal hernia (currently reduced), dehydration and renal failure. Initially admitted to the medical ward with supportive therapy including IV fluids, GI and surgical consultations, and empiric antibiotics. He's had progressive weakness, decreased level of consciousness, and now his ability to adequately protect airway is in question.  Patient was intubated but on 11/17 patient had worsening renal function, renal called for consultation, ascites and increasing pleural effusion on the right noted.  I have personally obtained a history, examined the patient, evaluated laboratory and imaging results, formulated the assessment and plan and placed orders.  CRITICAL CARE: The patient is critically ill with multiple  organ systems failure and requires high complexity decision making for assessment and support, frequent evaluation and titration of therapies, application of advanced monitoring technologies and extensive interpretation of multiple databases. Critical Care Time devoted to patient care services described in this note is 35 minutes.   Alyson Reedy, M.D. [facility] Pulmonary/Critical Care Medicine. Pager: [phone]. After hours pager: [phone].

## 2012-08-15 NOTE — Progress Notes (Signed)
Subjective: intubated  Objective: Vital signs in last 24 hours: Temp:  [96 F (35.6 C)-98.9 F (37.2 C)] 96 F (35.6 C) (11/17 0804) Pulse Rate:  [94-119] 111  (11/17 1125) Resp:  [12-34] 34  (11/17 1125) BP: (73-130)/(47-82) 120/63 mmHg (11/17 1125) SpO2:  [88 %-100 %] 92 % (11/17 1125) Arterial Line BP: (95-117)/(55-63) 105/58 mmHg (11/17 0800) FiO2 (%):  [40 %-65 %] 65 % (11/17 1125) Weight:  [208 lb 1.8 oz (94.4 kg)] 208 lb 1.8 oz (94.4 kg) (11/17 0500) Last BM Date: 08/15/12  Intake/Output from previous day: 11/16 0701 - 11/17 0700 In: 9214.5 [I.V.:6964.5; NG/GT:1440; IV Piggyback:810] Out: 49 [Urine:49] Intake/Output this shift: Total I/O In: 566.3 [I.V.:446.3; NG/GT:120] Out: 5 [Urine:5]  abdomen distended and tight.  TF on hold [facility] reducible  Lab Results:   [facility] 08/15/12 0355 08/14/12 0512  WBC 17.2* 13.3*  HGB 11.5* 11.8*  HCT 32.9* 33.9*  PLT 220 252   BMET  Basename 08/15/12 0355 08/14/12 2007  NA 136 140  K 4.5 3.9  CL 103 106  CO2 20 20  GLUCOSE 219* 181*  BUN 65* 61*  CREATININE 2.35* 2.13*  CALCIUM 7.9* 7.8*   PT/INR  Basename 08/13/12 1200  LABPROT 19.6*  INR 1.72*   ABG  Basename 08/15/12 0356 08/14/12 0354  PHART 7.296* 7.402  HCO3 21.8 17.4*    Studies/Results: Dg Chest Port 1 View  08/15/2012  *RADIOLOGY REPORT*  Clinical Data: Check endotracheal tube placement.  PORTABLE CHEST - 1 VIEW  Comparison: 08/13/2012  Findings: Endotracheal tube is 3.9 cm above the carina.  There is extensive opacification throughout the right hemithorax which is new.  There is evidence for a large right pleural effusion.  PICC line tip in the SVC region.  The patient has low lung volumes. Nasogastric tube extends into the abdomen.  IMPRESSION: Opacification of the right hemithorax.  Findings suggest development of a large right pleural effusion. There may be mild mediastinal shift towards the left but difficult to evaluate due to patient  rotation.  Low lung volumes.  Difficult to exclude left pleural fluid.   Original Report Authenticated By: Richarda Overlie, M.D.    Dg Chest Port 1 View  08/13/2012  *RADIOLOGY REPORT*  Clinical Data: Line placement.  PORTABLE CHEST - 1 VIEW  Comparison: Portable film at 1257 hours earlier today.  Findings: Endotracheal tube has been withdrawn slightly, now 2.5 cm above the carina.  PICC line tip appears slightly low, right atrium, the recommend withdrawal 4 cm.  Continued volume loss and opacity, worse at the right base.  IMPRESSION: ET tube 2.5 cm above carina.  PICC line tip slightly low, consider withdrawal 4 cm.   Original Report Authenticated By: Davonna Belling, M.D.    Dg Chest Port 1 View  08/13/2012  *RADIOLOGY REPORT*  Clinical Data: Intubation  PORTABLE CHEST - 1 VIEW  Comparison: Portable exam hours compared to 0931 hours  Findings: Rotated to the left. Carina is poorly visualized. Tip of endotracheal tube 9 mm above approximate position of the carina. Nasogastric tube extends into stomach. Low lung volumes with bibasilar atelectasis. Right arm PICC line, tip projecting over cavoatrial junction. No upper lungs clear.  IMPRESSION: Low lung volumes with bibasilar atelectasis. Tip of endotracheal tube is 9 mm above approximate position of the carina, recommend withdrawal 1-2 cm.  Findings called to [facility] on 2900 on 08/13/2012 at 1316 hours.   Original Report Authenticated By: Ulyses Southward, M.D.     Anti-infectives: Anti-infectives  Start     Dose/Rate Route Frequency Ordered Stop   08/15/12 1300   imipenem-cilastatin (PRIMAXIN) 250 mg in sodium chloride 0.9 % 100 mL IVPB        250 mg 200 mL/hr over 30 Minutes Intravenous 4 times per day 08/15/12 1119     08/13/12 1400   vancomycin (VANCOCIN) 750 mg in sodium chloride 0.9 % 150 mL IVPB  Status:  Discontinued        750 mg 150 mL/hr over 60 Minutes Intravenous Every 12 hours 08/13/12 1254 08/15/12 1115   08/13/12 1400   imipenem-cilastatin  (PRIMAXIN) 500 mg in sodium chloride 0.9 % 100 mL IVPB  Status:  Discontinued        500 mg 200 mL/hr over 30 Minutes Intravenous 3 times per day 08/13/12 1254 08/15/12 1119   08/12/12 1700   levofloxacin (LEVAQUIN) IVPB 750 mg  Status:  Discontinued        750 mg 100 mL/hr over 90 Minutes Intravenous Every 48 hours 08-27-2012 1749 08/12/12 1130   08/12/12 0600   vancomycin (VANCOCIN) IVPB 1000 mg/200 mL premix  Status:  Discontinued        1,000 mg 200 mL/hr over 60 Minutes Intravenous Every 24 hours 08/11/12 1141 08/12/12 1130   August 27, 2012 2200   piperacillin-tazobactam (ZOSYN) IVPB 3.375 g  Status:  Discontinued        3.375 g 12.5 mL/hr over 240 Minutes Intravenous Every 8 hours 2012/08/27 1749 08/12/12 1130   August 27, 2012 1800   vancomycin (VANCOCIN) 750 mg in sodium chloride 0.9 % 150 mL IVPB  Status:  Discontinued        750 mg 150 mL/hr over 60 Minutes Intravenous Every 12 hours 08-27-12 1749 08/11/12 1141   08/27/12 1530   vancomycin (VANCOCIN) IVPB 1000 mg/200 mL premix  Status:  Discontinued        1,000 mg 200 mL/hr over 60 Minutes Intravenous  Once 2012-08-27 1528 08/27/12 1808   08/27/12 1530   piperacillin-tazobactam (ZOSYN) IVPB 3.375 g        3.375 g 100 mL/hr over 30 Minutes Intravenous  Once 2012-08-27 1528 08-27-12 1630   27-Aug-2012 1530   levofloxacin (LEVAQUIN) IVPB 750 mg  Status:  Discontinued        750 mg 100 mL/hr over 90 Minutes Intravenous  Once Aug 27, 2012 1528 2012-08-27 1808          Assessment/Plan: Pancreatitis  LIH Hold TF due to distension Progressive MSOF Continue supportive therapy LIH reducible Will follow.   LOS: 5 days    Carmalita Wakefield A. 08/15/2012

## 2012-08-15 NOTE — Consult Note (Signed)
Reason for Consult:ARF Referring Physician: Molli Knock, MD  Gary [name] is an 66 y.o. male.  HPI: Pt is a 66yo WM with PMH sig for HTN, prostate Cancer, Etoh abuse, and chronic pancreatitis who was recently discharged from [facility] with Etoh withdrawal on 08/06/12 and presented 4 days later with generalized weakness.  He was found to have acute on chronic appendicitis and AKI with a creatinine that doubled since discharge.  His hospital course has been notable for the development of SIRS and multi-organ failure requiring intubation and multiple pressors.  His daughter reports that he did not have all of the "fluid" on him when he left or came in the hospital.  We were asked to see the patient as he has also developed oliguric ARF.  Of note, he was on lisinopril as an outpt.  Trend in Creatinine: Creatinine, Ser  Date/Time Value Range Status  08/15/2012  3:55 AM 2.35* 0.50 - 1.35 mg/dL Final  16/06/9603  5:40 PM 2.13* 0.50 - 1.35 mg/dL Final  98/07/9146 82:95 PM 1.68* 0.50 - 1.35 mg/dL Final  62/13/0865  7:84 AM 1.67* 0.50 - 1.35 mg/dL Final  69/62/9528  4:13 PM 1.34  0.50 - 1.35 mg/dL Final  24/40/1027 25:36 PM 1.45* 0.50 - 1.35 mg/dL Final  64/40/3474  2:59 AM 1.46* 0.50 - 1.35 mg/dL Final  56/38/7564  3:32 AM 1.40* 0.50 - 1.35 mg/dL Final  95/18/8416  6:06 AM 1.71* 0.50 - 1.35 mg/dL Final  30/16/0109  3:23 PM 1.70* 0.50 - 1.35 mg/dL Final  55/73/2202  5:42 PM 1.64* 0.50 - 1.35 mg/dL Final  70/02/2375  2:83 AM 0.88  0.50 - 1.35 mg/dL Final  15/09/7614 07:37 AM 0.85  0.50 - 1.35 mg/dL Final  07/04/2693  8:54 AM 0.89  0.50 - 1.35 mg/dL Final  62/03/349 09:38 AM 1.10  0.50 - 1.35 mg/dL Final  10/07/2991  7:16 PM 0.83  0.50 - 1.35 mg/dL Final  06/04/7892  8:10 PM 0.90  0.50 - 1.35 mg/dL Final  10/05/5100  5:85 PM 0.88  0.50 - 1.35 mg/dL Final  11/05/7822  2:35 AM 0.95  0.50 - 1.35 mg/dL Final  36/14/4315  4:00 AM 0.83  0.50 - 1.35 mg/dL Final    PMH:   Past Medical History  Diagnosis Date  .  Hypertension   . GERD (gastroesophageal reflux disease)   . Pneumonia     "5-7 years ago" (08/03/2012)  . Exertional dyspnea     "just recently" (08/03/2012)  . Delirium, acute 08/03/2012  . Prostate cancer     PSH:   Past Surgical History  Procedure Date  . Prostate surgery   . Hydrocele excision / repair 2010  . Inguinal hernia repair 12/08/2011    Procedure: HERNIA REPAIR INGUINAL ADULT;  Surgeon: Liz Malady, MD;  Location: [facility] OR;  Service: General;  Laterality: Right;  repair right inguinal hernia with mesh  . Irrigation and debridement abscess 03/01/2012    Procedure: IRRIGATION AND DEBRIDEMENT ABSCESS;  Surgeon: Ernestene Mention, MD;  Location: [facility] OR;  Service: General;  Laterality: Right;  Incision and drainage with debridement right breast abscess  . Tonsillectomy   . Cataract extraction w/ intraocular lens  implant, bilateral 2008    Allergies: No Known Allergies  Medications:   Prior to Admission medications   Medication Sig Start Date End Date Taking? Authorizing Provider  aspirin EC 81 MG tablet Take 162 mg by mouth daily.   Yes Historical Provider, MD  cholecalciferol (VITAMIN D) 1000 UNITS  tablet Take 1,000 Units by mouth daily.   Yes Historical Provider, MD  ciprofloxacin (CIPRO) 500 MG tablet Take 1 tablet (500 mg total) by mouth 2 (two) times daily. 08/07/12  Yes Shanker Levora Dredge, MD  ciprofloxacin (CIPRO) 500 MG tablet Take 500 mg by mouth 2 (two) times daily. For 10 days starting on 08/07/12   Yes Historical Provider, MD  feeding supplement (ENSURE COMPLETE) LIQD Take 237 mLs by mouth 2 (two) times daily between meals. 08/07/12  Yes Shanker Levora Dredge, MD  folic acid (FOLVITE) 1 MG tablet Take 1 tablet (1 mg total) by mouth daily. 08/07/12  Yes Shanker Levora Dredge, MD  guaiFENesin-dextromethorphan (ROBITUSSIN DM) 100-10 MG/5ML syrup Take 5 mLs by mouth every 4 (four) hours as needed for cough. 08/07/12  Yes Shanker Levora Dredge, MD  lisinopril (PRINIVIL,ZESTRIL) 10 MG  tablet Take 10 mg by mouth daily.   Yes Historical Provider, MD  metroNIDAZOLE (FLAGYL) 500 MG tablet Take 500 mg by mouth 3 (three) times daily. For 10 days starting on 08/07/12   Yes Historical Provider, MD  Multiple Vitamin (MULTIVITAMIN WITH MINERALS) TABS Take 1 tablet by mouth daily. 08/07/12  Yes Shanker Levora Dredge, MD  omeprazole (PRILOSEC) 20 MG capsule Take 20 mg by mouth daily.   Yes Historical Provider, MD  thiamine 100 MG tablet Take 1 tablet (100 mg total) by mouth daily. 08/07/12  Yes Shanker Levora Dredge, MD  vitamin E 400 UNIT capsule Take 800 Units by mouth daily.   Yes Historical Provider, MD    Discontinued Meds:   Medications Discontinued During This Encounter  Medication Reason  . nicotine (NICODERM CQ - DOSED IN MG/24 HOURS) 21 mg/24hr patch Prescription never filled  . folic acid (FOLVITE) 400 MCG tablet Duplicate  . metroNIDAZOLE (FLAGYL) 500 MG tablet Inpatient Standard  . 0.9 %  sodium chloride infusion   . levofloxacin (LEVAQUIN) IVPB 750 mg   . sodium polystyrene (KAYEXALATE) 15 GM/60ML suspension 30 g   . vancomycin (VANCOCIN) IVPB 1000 mg/200 mL premix   . sodium chloride 0.9 % 1,000 mL with multivitamins adult (MVI -12) 10 mL infusion Duplicate  . thiamine (B-1) injection 100 mg Change in therapy  . folic acid 1 mg in sodium chloride 0.9 % 50 mL IVPB Change in therapy  . sodium chloride 0.9 % 1,000 mL with multivitamins adult (MVI -12) 10 mL infusion Change in therapy  . acetaminophen (TYLENOL) tablet 650 mg   . acetaminophen (TYLENOL) suppository 650 mg   . morphine 2 MG/ML injection 1 mg   . morphine 2 MG/ML injection 2 mg   . vancomycin (VANCOCIN) 750 mg in sodium chloride 0.9 % 150 mL IVPB   . levofloxacin (LEVAQUIN) IVPB 750 mg   . vancomycin (VANCOCIN) IVPB 1000 mg/200 mL premix   . piperacillin-tazobactam (ZOSYN) IVPB 3.375 g   . sodium chloride 0.9 % 1,000 mL infusion Entry Error  . feeding supplement (VITAL AF 1.2 CAL) liquid 1,000 mL   . docusate  sodium (COLACE) capsule 100 mg   . ipratropium (ATROVENT) nebulizer solution 0.5 mg   . 0.9 %  sodium chloride infusion   . albuterol (PROVENTIL) (5 MG/ML) 0.5% nebulizer solution 2.5 mg   . guaiFENesin-dextromethorphan (ROBITUSSIN DM) 100-10 MG/5ML syrup 5 mL   . sodium chloride 0.9 % injection 10-40 mL   . LORazepam (ATIVAN) injection 1 mg   . fentaNYL (SUBLIMAZE) injection 25-50 mcg   . insulin aspart (novoLOG) injection 0-9 Units   . 0.9 %  sodium chloride infusion   . vancomycin (VANCOCIN) 750 mg in sodium chloride 0.9 % 150 mL IVPB Change in therapy  . imipenem-cilastatin (PRIMAXIN) 500 mg in sodium chloride 0.9 % 100 mL IVPB     Social History:  reports that he has been smoking Cigarettes.  He has a 52 pack-year smoking history. He has never used smokeless tobacco. He reports that he drinks about 16.8 ounces of alcohol per week. He reports that he does not use illicit drugs.  Family History:   Family History  Problem Relation Age of Onset  . Heart disease Father     Review of systems not obtained due to patient factors. Weight change: 12.8 kg (28 lb 3.5 oz)  Intake/Output Summary (Last 24 hours) at 08/15/12 1155 Last data filed at 08/15/12 1000  Gross per 24 hour  Intake 8070.75 ml  Output     51 ml  Net 8019.75 ml    General appearance: toxic and intubated/sedated Head: Normocephalic, without obvious abnormality, atraumatic Neck: no adenopathy, no carotid bruit, no JVD, supple, symmetrical, trachea midline and thyroid not enlarged, symmetric, no tenderness/mass/nodules Resp: scattered rhonchi with diminshed BS at bases Cardio: no rub and tacchycardic GI: hypoactive BS, Distended, tense Extremities: diffuse anasarca, some mottling of extremities, legs have bandage wrappings bilaterally  Labs: Basic Metabolic Panel:  Lab 08/15/12 1610 08/14/12 2007 08/14/12 1207 08/14/12 0512 08/13/12 2007 08/13/12 1200 08/13/12 0454 08/12/12 0510 08/11/12 0425 08/26/2012 1333  NA 136  140 139 138 137 139 135 -- -- --  K 4.5 3.9 3.1* 3.9 3.0* 3.7 3.8 -- -- --  CL 103 106 101 108 102 109 104 -- -- --  CO2 20 20 32 18* 26 17* 16* -- -- --  GLUCOSE 219* 181* 533* 203* 417* 140* 166* -- -- --  BUN 65* 61* 54* 58* 52* 54* 52* -- -- --  CREATININE 2.35* 2.13* 1.68* 1.67* 1.34 1.45* 1.46* -- -- --  ALBUMIN -- -- -- 1.2* -- -- 1.5* 1.4* 1.6* 1.9*  CALCIUM 7.9* 7.8* 6.5* 7.7* 6.8* 8.2* 8.0* -- -- --  PHOS 4.7* -- -- -- -- -- -- 4.5 -- --   Liver Function Tests:  Lab 08/14/12 0512 08/13/12 0454 08/12/12 0510  AST 109* 70* 77*  ALT 42 34 39  ALKPHOS 187* 173* 125*  BILITOT 1.7* 2.2* 2.4*  PROT 5.5* 6.0 5.9*  ALBUMIN 1.2* 1.5* 1.4*    Lab 08/14/12 0512 08/13/12 0454 08/12/12 0510  LIPASE 1558* 1264* 1191*  AMYLASE -- -- --   No results found for this basename: AMMONIA:3 in the last 168 hours CBC:  Lab 08/15/12 0355 08/14/12 0512 08/13/12 0454 08/12/12 0510 08/27/2012 1319  WBC 17.2* 13.3* 16.7* 17.2* --  NEUTROABS -- -- -- -- 22.6*  HGB 11.5* 11.8* 12.3* 13.9 --  HCT 32.9* 33.9* 35.1* 39.9 --  MCV 102.8* 103.4* 101.2* 103.9* --  PLT 220 252 303 243 --   PT/INR: @labrcntip (inr:5) Cardiac Enzymes: No results found for this basename: CKTOTAL:5,CKMB:5,CKMBINDEX:5,TROPONINI:5 in the last 168 hours CBG:  Lab 08/15/12 0800 08/15/12 0003 08/14/12 2006 08/14/12 1617 08/14/12 1156  GLUCAP 196* 192* 184* 180* 178*    Iron Studies: No results found for this basename: IRON:30,TIBC:30,TRANSFERRIN:30,FERRITIN:30 in the last 168 hours  Xrays/Other Studies: Dg Chest Port 1 View  08/15/2012  *RADIOLOGY REPORT*  Clinical Data: Check endotracheal tube placement.  PORTABLE CHEST - 1 VIEW  Comparison: 08/13/2012  Findings: Endotracheal tube is 3.9 cm above the carina.  There is extensive  opacification throughout the right hemithorax which is new.  There is evidence for a large right pleural effusion.  PICC line tip in the SVC region.  The patient has low lung volumes. Nasogastric  tube extends into the abdomen.  IMPRESSION: Opacification of the right hemithorax.  Findings suggest development of a large right pleural effusion. There may be mild mediastinal shift towards the left but difficult to evaluate due to patient rotation.  Low lung volumes.  Difficult to exclude left pleural fluid.   Original Report Authenticated By: Richarda Overlie, M.D.    Dg Chest Port 1 View  08/13/2012  *RADIOLOGY REPORT*  Clinical Data: Line placement.  PORTABLE CHEST - 1 VIEW  Comparison: Portable film at 1257 hours earlier today.  Findings: Endotracheal tube has been withdrawn slightly, now 2.5 cm above the carina.  PICC line tip appears slightly low, right atrium, the recommend withdrawal 4 cm.  Continued volume loss and opacity, worse at the right base.  IMPRESSION: ET tube 2.5 cm above carina.  PICC line tip slightly low, consider withdrawal 4 cm.   Original Report Authenticated By: Davonna Belling, M.D.    Dg Chest Port 1 View  08/13/2012  *RADIOLOGY REPORT*  Clinical Data: Intubation  PORTABLE CHEST - 1 VIEW  Comparison: Portable exam hours compared to 0931 hours  Findings: Rotated to the left. Carina is poorly visualized. Tip of endotracheal tube 9 mm above approximate position of the carina. Nasogastric tube extends into stomach. Low lung volumes with bibasilar atelectasis. Right arm PICC line, tip projecting over cavoatrial junction. No upper lungs clear.  IMPRESSION: Low lung volumes with bibasilar atelectasis. Tip of endotracheal tube is 9 mm above approximate position of the carina, recommend withdrawal 1-2 cm.  Findings called to [facility] on 2900 on 08/13/2012 at 1316 hours.   Original Report Authenticated By: Ulyses Southward, M.D.      Assessment/Plan: 1.  ARF, oliguric- most consistent with ischemic ATN rather than hepatorenal syndrome as he was on an ACE I PTA and his LFT's are not that unremarkable/bilirubin is improving.   Also on the ddx would be AIN, abdominal compartment syndrome, HRS.  I have  discussed the case with Dr. Molli Knock and feel that given his ongoing need for pressors, marked anasarca, hypoxia, and oliguria a trial of CRRT may be beneficial.  Will plan to initiate CVVHD later today after an HD catheter is placed. 2. VDRF- per PCCM 3. SIRS- as above, on multiple pressors 4. Acute on chronic pancreatitis- cont with supportive care 5. Left inguinal hernia, reducible- surgery following 6. Ascites- would refrain from large volume paracentesis given hemodynamic instability. 7. Cirrhosis- as above 8. etoh- would recommend w/d precaustions 9. Disposition- prognosis is grim overall.   Donovan Gatchel A 08/15/2012, 11:55 AM

## 2012-08-15 NOTE — Procedures (Signed)
Central Venous Catheter Insertion Procedure Note Gary [name] [id number] 1945-10-22  Procedure: Insertion of Central Venous Catheter Indications: HD catheter access  Procedure Details Consent: Risks of procedure as well as the alternatives and risks of each were explained to the (patient/caregiver).  Consent for procedure obtained. Time Out: Verified patient identification, verified procedure, site/side was marked, verified correct patient position, special equipment/implants available, medications/allergies/relevent history reviewed, required imaging and test results available.  Performed  Maximum sterile technique was used including antiseptics, cap, gloves, gown, hand hygiene, mask and sheet. Skin prep: Chlorhexidine; local anesthetic administered A antimicrobial bonded/coated Trialysis catheter was placed in the left internal jugular vein using the Seldinger technique.  Evaluation Blood flow good Complications: No apparent complications Patient did tolerate procedure well. Chest X-ray ordered to verify placement.  CXR: pending.  U/S used in placement.  Gary [name] 08/15/2012, 4:48 PM

## 2012-08-15 NOTE — Plan of Care (Signed)
Problem: Phase I Progression Outcomes Goal: Hemodynamically stable Outcome: Not Progressing Remains tachycardic.  Hypotensive requiring titration of phenylephrine and addition of vasopressin. CVP 18. Goal: Patient tolerating weaning plan Outcome: Not Progressing Increasing oxygenation needs requiring increase of ventilator rate, FIO2 to 75%, and addition of PEEP.  PO2 on these settings is 68. Goal: Voiding-avoid urinary catheter unless indicated Outcome: Not Progressing Patient is essentially aneuric.   Renal consult requested.  CVVH initiation pending dialysis catheter insertion.

## 2012-08-16 ENCOUNTER — Inpatient Hospital Stay (HOSPITAL_COMMUNITY): Payer: Medicare Other

## 2012-08-16 LAB — HEPATIC FUNCTION PANEL
ALT: 34 U/L (ref 0–53)
AST: 87 U/L — ABNORMAL HIGH (ref 0–37)
Albumin: 1.3 g/dL — ABNORMAL LOW (ref 3.5–5.2)
Alkaline Phosphatase: 207 U/L — ABNORMAL HIGH (ref 39–117)
Total Bilirubin: 1.8 mg/dL — ABNORMAL HIGH (ref 0.3–1.2)
Total Protein: 5.9 g/dL — ABNORMAL LOW (ref 6.0–8.3)

## 2012-08-16 LAB — GLUCOSE, CAPILLARY
Glucose-Capillary: 149 mg/dL — ABNORMAL HIGH (ref 70–99)
Glucose-Capillary: 152 mg/dL — ABNORMAL HIGH (ref 70–99)

## 2012-08-16 LAB — CULTURE, BLOOD (ROUTINE X 2)
Culture: NO GROWTH
Culture: NO GROWTH

## 2012-08-16 LAB — CBC
HCT: 34.1 % — ABNORMAL LOW (ref 39.0–52.0)
Hemoglobin: 11.8 g/dL — ABNORMAL LOW (ref 13.0–17.0)
MCH: 35.4 pg — ABNORMAL HIGH (ref 26.0–34.0)
MCHC: 34.6 g/dL (ref 30.0–36.0)
MCV: 102.4 fL — ABNORMAL HIGH (ref 78.0–100.0)
Platelets: 232 10*3/uL (ref 150–400)
RBC: 3.33 MIL/uL — ABNORMAL LOW (ref 4.22–5.81)
RDW: 16.2 % — ABNORMAL HIGH (ref 11.5–15.5)
WBC: 30.5 10*3/uL — ABNORMAL HIGH (ref 4.0–10.5)

## 2012-08-16 LAB — BLOOD GAS, ARTERIAL
Acid-base deficit: 3.4 mmol/L — ABNORMAL HIGH (ref 0.0–2.0)
Bicarbonate: 20.9 mEq/L (ref 20.0–24.0)
Drawn by: 364961
FIO2: 0.6 %
O2 Saturation: 92.4 %
PEEP: 10 cmH2O
Patient temperature: 97.3
RATE: 30 resp/min

## 2012-08-16 LAB — RENAL FUNCTION PANEL
BUN: 54 mg/dL — ABNORMAL HIGH (ref 6–23)
CO2: 21 mEq/L (ref 19–32)
Calcium: 6 mg/dL — CL (ref 8.4–10.5)
Calcium: 7.8 mg/dL — ABNORMAL LOW (ref 8.4–10.5)
Creatinine, Ser: 2.23 mg/dL — ABNORMAL HIGH (ref 0.50–1.35)
GFR calc Af Amer: 46 mL/min — ABNORMAL LOW (ref >90)
GFR calc Af Amer: 33 mL/min — ABNORMAL LOW (ref >90)
GFR calc non Af Amer: 39 mL/min — ABNORMAL LOW (ref >90)
Glucose, Bld: 784 mg/dL (ref 70–99)
Glucose, Bld: 142 mg/dL — ABNORMAL HIGH (ref 70–99)
Phosphorus: 4 mg/dL (ref 2.3–4.6)
Phosphorus: 5.6 mg/dL — ABNORMAL HIGH (ref 2.3–4.6)
Potassium: 3.3 mEq/L — ABNORMAL LOW (ref 3.5–5.1)
Sodium: 138 mEq/L (ref 135–145)
Sodium: 137 mEq/L (ref 135–145)

## 2012-08-16 LAB — POCT I-STAT 3, ART BLOOD GAS (G3+)
Acid-Base Excess: 2 mmol/L (ref 0.0–2.0)
Acid-base deficit: 3 mmol/L — ABNORMAL HIGH (ref 0.0–2.0)
Bicarbonate: 22.6 mEq/L (ref 20.0–24.0)
Bicarbonate: 25.4 mEq/L — ABNORMAL HIGH (ref 20.0–24.0)
O2 Saturation: 91 %
O2 Saturation: 93 %
Patient temperature: 98
TCO2: 24 mmol/L (ref 0–100)
pO2, Arterial: 61 mmHg — ABNORMAL LOW (ref 80.0–100.0)

## 2012-08-16 LAB — FOLATE RBC: RBC Folate: 337 ng/mL — ABNORMAL LOW (ref >=366)

## 2012-08-16 LAB — MAGNESIUM: Magnesium: 2 mg/dL (ref 1.5–2.5)

## 2012-08-16 LAB — LIPASE, BLOOD: Lipase: 2083 U/L — ABNORMAL HIGH (ref 11–59)

## 2012-08-16 LAB — VANCOMYCIN, RANDOM: Vancomycin Rm: 16.3 ug/mL

## 2012-08-16 LAB — AMMONIA: Ammonia: 32 umol/L (ref 11–60)

## 2012-08-16 LAB — GLUCOSE, RANDOM: Glucose, Bld: 181 mg/dL — ABNORMAL HIGH (ref 70–99)

## 2012-08-16 MED ORDER — SODIUM CHLORIDE 0.9 % IV SOLN
1.0000 mg/h | INTRAVENOUS | Status: DC
  Administered 2012-08-16 – 2012-08-17 (×3): 2 mg/h via INTRAVENOUS
  Filled 2012-08-16 (×5): qty 10

## 2012-08-16 MED ORDER — KETOCONAZOLE 2 % EX CREA
1.0000 "application " | TOPICAL_CREAM | Freq: Two times a day (BID) | CUTANEOUS | Status: DC
  Administered 2012-08-16 – 2012-08-22 (×12): 1 via TOPICAL
  Filled 2012-08-16 (×3): qty 15

## 2012-08-16 MED ORDER — SODIUM CHLORIDE 0.9 % IV SOLN
100.0000 mg | Freq: Every day | INTRAVENOUS | Status: DC
  Administered 2012-08-16 – 2012-08-22 (×7): 100 mg via INTRAVENOUS
  Filled 2012-08-16 (×7): qty 100

## 2012-08-16 MED ORDER — SODIUM CHLORIDE 0.9 % IV SOLN
25.0000 ug/h | INTRAVENOUS | Status: DC
  Administered 2012-08-16 – 2012-08-19 (×3): 50 ug/h via INTRAVENOUS
  Filled 2012-08-16 (×3): qty 50

## 2012-08-16 MED ORDER — HEPARIN SODIUM (PORCINE) 5000 UNIT/ML IJ SOLN
5000.0000 [IU] | Freq: Three times a day (TID) | INTRAMUSCULAR | Status: DC
  Administered 2012-08-16 – 2012-08-18 (×6): 5000 [IU] via SUBCUTANEOUS
  Filled 2012-08-16 (×11): qty 1

## 2012-08-16 MED ORDER — ALBUTEROL SULFATE HFA 108 (90 BASE) MCG/ACT IN AERS
6.0000 | INHALATION_SPRAY | Freq: Four times a day (QID) | RESPIRATORY_TRACT | Status: DC
  Administered 2012-08-16 – 2012-08-22 (×25): 6 via RESPIRATORY_TRACT
  Filled 2012-08-16 (×3): qty 6.7

## 2012-08-16 MED ORDER — ALBUMIN HUMAN 25 % IV SOLN
50.0000 g | Freq: Four times a day (QID) | INTRAVENOUS | Status: DC
  Administered 2012-08-16 – 2012-08-17 (×2): 50 g via INTRAVENOUS
  Filled 2012-08-16 (×5): qty 200

## 2012-08-16 MED ORDER — NOREPINEPHRINE BITARTRATE 1 MG/ML IJ SOLN
2.0000 ug/min | INTRAVENOUS | Status: DC
  Administered 2012-08-16: 6 ug/min via INTRAVENOUS
  Administered 2012-08-17: 12 ug/min via INTRAVENOUS
  Administered 2012-08-21: 13 ug/min via INTRAVENOUS
  Administered 2012-08-22: 7 ug/min via INTRAVENOUS
  Administered 2012-08-22: 15 ug/min via INTRAVENOUS
  Filled 2012-08-16 (×8): qty 16

## 2012-08-16 MED ORDER — VITAL AF 1.2 CAL PO LIQD
1000.0000 mL | ORAL | Status: DC
  Filled 2012-08-16 (×2): qty 1000

## 2012-08-16 NOTE — Progress Notes (Signed)
PULMONARY  / CRITICAL CARE MEDICINE  Name: Gary [name] MRN: [id number] DOB: 1946-02-15    LOS: 6  REFERRING MD :  [name]  CHIEF COMPLAINT: encephalopathy, respiratory insufficiency  BRIEF PATIENT DESCRIPTION:  66 year old white male admitted 11/12 for abdominal pain in the setting of acute pancreatitis an incarcerated left inguinal hernia. Initially admitted to medicine service. Critical care asked to see on 11/15 for encephalopathy, and concern for progressive respiratory distress and inability to protect airway.  LINES / TUBES: RUE PICC line 11/15>>> L IJ HD Cath 11/17>>> R Rad Aline 11/16>>>  CULTURES: 11/16 Resp >>>CANDIDA ALBICANS 11/13 urine culture: Negative. 11/12 blood culture x2>>>Negative  ANTIBIOTICS: 11/12 levofloxacin: 1 dose. Zosyn 11/12>>>11/14 Primaxin 11/15>>> Vancomycin 11/12>>>11/14>>> 11/15>>>  SIGNIFICANT EVENTS:  CT abdomen 11/12: Enlargement of. Pancreatic fluid collection with significant mass effect on both right and left lobe of liver. Suggesting enlarging pseudocysts. Nonspecific nodularity edema of the mesentery with no focal mass. Stable left inguinal hernia. Abdominal ultrasound 11/12: gallstone with echogenic sludge, but no evidence of biliary obstruction or acute cholecystitis. Increased ascites compared to CT 7 days prior fluid around the liver with mass effect. ECHO 11/12: Systolic function was normal. The estimated ejection fraction was in the range of 50% to 55%. There was an increased relative contribution of atrial contraction to ventricular filling.   Bilateral LE ultrasound in 11/15>>>> LEVEL OF CARE:  ICU PRIMARY SERVICE:  [facility] CONSULTANTS:  PCCM, nephro CODE STATUS full code DIET:  N.p.o. DVT Px:  Subcutaneous heparin GI Px:    HISTORY OF PRESENT ILLNESS:   This is 66 year old male patient with a history of prostate cancer, hypertension, and alcohol abuse. Recently discharged from the hospital for alcohol withdrawal and  abdominal pain secondary to incarcerated left inguinal hernia which was manually reduced by surgery. He was discharged home with planned followup for potential elective hernia repair. Per spouse, patient has had one alcoholic beverage since discharge. Presented to the emergency room on 11/12 with chief complaint of generalized weakness, and increasing fatigue. Also nonproductive cough. Also still complaining of abdominal pain both intermittently upper abdomen, and lower abdomen. Diagnostic evaluation was consistent with pancreatitis CT abdomen demonstrated what appears to be progressive pseudocyst he was admitted by medical services for supportive care. In addition he's had progressive bilateral lower extremity swelling and discomfort with erythema felt to be consistent with candida dermatitis. Critical care services were asked to see on 11/15, as the patient has become progressively lethargic, with poor cough mechanics, worsening abdominal pain, and decreased level of consciousness. Therapeutic interventions to date have included IV antibiotics, both surgical and GI consult services, as well as IV fluid resuscitation. Chest x-ray evaluation on 11/12 demonstrates progressive bibasilar right greater than left volume loss.  VITAL SIGNS: Temp:  [97.2 F (36.2 C)-97.6 F (36.4 C)] 97.3 F (36.3 C) (11/18 0800) Pulse Rate:  [56-117] 101  (11/18 1020) Resp:  [0-35] 19  (11/18 1020) BP: (84-141)/(59-85) 93/60 mmHg (11/18 0847) SpO2:  [90 %-97 %] 96 % (11/18 1020) Arterial Line BP: (77-158)/(52-89) 80/53 mmHg (11/18 1020) FiO2 (%):  [60 %-75 %] 60 % (11/18 1000) Weight:  [96.2 kg (212 lb 1.3 oz)] 96.2 kg (212 lb 1.3 oz) (11/18 0500) HEMODYNAMICS: CVP:  [15 mmHg-19 mmHg] 19 mmHg VENTILATOR SETTINGS: Vent Mode:  [-] PRVC FiO2 (%):  [60 %-75 %] 60 % Set Rate:  [30 bmp] 30 bmp Vt Set:  [470 mL] 470 mL PEEP:  [8 cmH20-10 cmH20] 8 cmH20 Plateau Pressure:  [20 cmH20-26  cmH20] 21 cmH20 INTAKE /  OUTPUT: Intake/Output      11/17 0701 - 11/18 0700 11/18 0701 - 11/19 0700   I.V. (mL/kg) 3336.2 (34.7) 239.1 (2.5)   NG/GT 60 20   IV Piggyback 400    Total Intake(mL/kg) 3796.2 (39.5) 259.1 (2.7)   Urine (mL/kg/hr) 49 (0)    Emesis/NG output 50    Other 3213 671   Total Output 3312 671   Net +484.2 -411.9          PHYSICAL EXAMINATION: General:  Chronically ill-appearing 66 year old male appears older than stated age.  Neuro:  Sedated and intubated HEENT:  Normal systolic, no JVD. Cardiovascular:  No murmur rub or gallop. Regular rhythm rapid rate Lungs: Coarse BS diffusely. Abdomen:  Distended, firm, generalized discomfort to palpation. Has shifting dullness to percussion. Positive bowel sounds. Musculoskeletal:  Generalized weakness but intact Skin:  Lower extremities are both swollen and edematous. Erythemic, warm to touch, and patient reports significant discomfort to gentle palpation bilaterally   LABS:  Lab 08/16/12 0930 08/16/12 0620 08/16/12 0500 08/16/12 0340 08/15/12 1854 08/15/12 1317 08/15/12 1146 08/15/12 0356 08/15/12 0355 08/14/12 0512 08/13/12 1200 08/13/12 1130 08/13/12 0454 08/12/12 0510 08/11/12 0425  HGB -- -- 11.8* -- -- -- -- -- 11.5* 11.8* -- -- -- -- --  WBC -- -- 30.5* -- -- -- -- -- 17.2* 13.3* -- -- -- -- --  PLT -- -- 232 -- -- -- -- -- 220 252 -- -- -- -- --  NA -- -- 138 -- 140 -- 138 -- -- -- -- -- -- -- --  K -- -- 3.3* -- 4.0 -- -- -- -- -- -- -- -- -- --  CL -- -- 87* -- 102 -- 102 -- -- -- -- -- -- -- --  CO2 -- -- >45* -- 21 -- 23 -- -- -- -- -- -- -- --  GLUCOSE -- 181* 784* -- 220* -- -- -- -- -- -- -- -- -- --  BUN -- -- 47* -- 70* -- 68* -- -- -- -- -- -- -- --  CREATININE -- -- 1.72* -- 2.55* -- 2.63* -- -- -- -- -- -- -- --  CALCIUM -- -- 6.0* -- 7.0* -- 7.6* -- -- -- -- -- -- -- --  MG -- -- 2.0 -- -- -- -- -- 2.5 -- -- -- -- 2.6* --  PHOS -- -- 4.0 -- 5.7* -- -- -- 4.7* -- -- -- -- -- --  AST 87* -- -- -- -- -- -- -- -- 109* --  -- 70* -- --  ALT 34 -- -- -- -- -- -- -- -- 42 -- -- 34 -- --  ALKPHOS 207* -- -- -- -- -- -- -- -- 187* -- -- 173* -- --  BILITOT 1.8* -- -- -- -- -- -- -- -- 1.7* -- -- 2.2* -- --  PROT 5.9* -- -- -- -- -- -- -- -- 5.5* -- -- 6.0 -- --  ALBUMIN 1.3* -- 1.1* -- 1.2* -- -- -- -- -- -- -- -- -- --  APTT -- -- -- -- -- -- 34 -- -- -- -- -- -- -- --  INR -- -- -- -- -- -- 1.35 -- -- -- 1.72* -- -- -- 1.65*  LATICACIDVEN -- -- -- -- -- -- -- -- -- 2.9* -- -- -- -- --  TROPONINI -- -- -- -- -- -- -- -- -- -- -- -- -- -- --  PROCALCITON -- -- -- -- -- -- -- -- 5.71 4.36 -- 3.86 -- -- --  PROBNP -- -- -- -- -- -- -- -- -- -- -- -- -- -- --  O2SATVEN -- -- -- -- -- -- -- -- -- -- -- -- -- -- --  PHART -- -- -- 7.391 -- 7.381 -- 7.296* -- -- -- -- -- -- --  PCO2ART -- -- -- 34.9* -- 41.0 -- 44.7 -- -- -- -- -- -- --  PO2ART -- -- -- 62.8* -- 68.0* -- 63.0* -- -- -- -- -- -- --    Lab 08/16/12 0734 08/16/12 0456 08/16/12 0453 08/16/12 0036 08/15/12 1947  GLUCAP 153* 149* >600* 152* 198*   Lipase     Component Value Date/Time   LIPASE 2083* 08/16/2012 0930    Intake/Output Summary (Last 24 hours) at 08/16/12 1054 Last data filed at 08/16/12 1000  Gross per 24 hour  Intake 3548.97 ml  Output   3978 ml  Net -429.03 ml   IMAGING: PCXR: Progressive bibasilar volume loss right greater than left consistent with worsening atelectasis  ECG: ST  DIAGNOSES: Principal Problem:  *Pancreatitis, alcoholic, acute Active Problems:  HTN (hypertension)  Alcohol abuse  Tobacco abuse  HCAP (healthcare-associated pneumonia)  ARF (acute renal failure)  Hyperkalemia  Hyponatremia  Hernia, inguinal  Cholelithiases  Leukocytosis  Pancreatic pseudocyst/cyst  Lower extremity pain, bilateral  Candidal dermatitis  Dehydration  Acute respiratory failure with hypoxia  Alcoholic liver damage  Nonspecific elevation of levels of transaminase or lactic acid dehydrogenase (LDH)  ASSESSMENT /  PLAN:  PULMONARY  ASSESSMENT: Bibasilar right greater than left atelectasis Currently gas exchange and oxygenation are adequate. He has progression of bibasilar volume loss right greater than left. This could reflect healthcare associated pneumonia versus aspiration pneumonia,but would favor progressive atelectasis. He has a decent cough when requested, however not able to expectorate secretions. Certainly at risk for inability to protect airway.  Massive right sided pleural effusion.  PLAN:   ARDS protocol, PEEP down to 5 and FiO2 to 50%. Repeat gas in one hour. Stress dose steriods. Will likely thora in AM then para the day after.  CARDIOVASCULAR  ASSESSMENT:  Systemic inflammatory response syndrome/Sirs and sepsis: in the setting of pancreatitis with evolving pseudocyst. potential sources: being infected pseudocyst, lower extremity cellulitis. Also possible aspiration versus hospital associated pneumonia Lower extremity edema: Bilateral likely reflects cellulitis, however need to rule out deep vein thrombosis.  PLAN:  Continue Norepi. Ultrafiltration via CVVHD running negative. CVPs Q4H. Thora in AM.  RENAL  ASSESSMENT:   Acute renal failure Probably multi-factorial but hepato-renal syndrome seems to be most likely. Metabolic acidosis: improved with CVVHD  PLAN:   CVP Q4h KVO IVF  Renal dose medications. Renal following  GASTROINTESTINAL  ASSESSMENT:   Acute pancreatitis with pseudocyst: still in marked abdominal discomfort, progressive ascites, worsening abdominal distention. Tube feeds. Lipase rising  Incarcerated left inguinal hernia: surgery planned for future, but currently elective Cholelithiasis: no evidence of cholecystitis Abnormal LFTs in the setting of alcoholic hepatitis Protein calorie malnutrition  PLAN:   GI and surgical services are following, but no acute surgical indications needed Hold TF GI PX  ordered  HEMATOLOGIC  ASSESSMENT: Hemodilution related anemia: no current evidence of blood loss Coagulopathy: Mild, probably related to alcohol hepatitis PLAN:  Monitor coags. Heparin sub q for dvt px Transfuse for hemoglobin less than 7 and last he developed shock, then goal directed therapy protocol will drive transfusion trigger.  INFECTIOUS  ASSESSMENT:   Sirs/sepsis: Multiple potential sources as follows: Infected pseudocyst, HCAP Versus aspiration pneumonia and lower extremity cellulitis. Possible candida dermatitis both lower extremities.  PLAN:   Follow pending cultures. Start micafungin per pharm Continue Nizoral cream to lower extremities. Continue primaxin and vanc. Renal dose abx  ENDOCRINE  ASSESSMENT:   Hyperglycemia - sugars of 600/700 were drawn from line with d5w running  PLAN:   Cont CBG SSI as needed  NEUROLOGIC  ASSESSMENT:   Acute encephalopathy: Likely in the setting of sepsis. Additionally concerned about alcohol withdrawal PLAN:   PRN sedation only. Follow ammonia  CLINICAL SUMMARY:  66 year old male patient admitted with abdominal pain and progressive weakness pancreatitis, left inguinal hernia (currently reduced), dehydration and renal failure. Initially admitted to the medical ward with supportive therapy including IV fluids, GI and surgical consultations, and empiric antibiotics. He's had progressive weakness, decreased level of consciousness, and now his ability to adequately protect airway is in question.  Patient was intubated but on 11/17 patient had worsening renal function, renal was called and CVVHD was started.  Currently on Vanc and primaxin.  Starting micafungin for positive sputum.  I have personally obtained a history, examined the patient, evaluated laboratory and imaging results, formulated the assessment and plan and placed orders.  CRITICAL CARE: The patient is critically ill with multiple organ systems failure and requires  high complexity decision making for assessment and support, frequent evaluation and titration of therapies, application of advanced monitoring technologies and extensive interpretation of multiple databases. Critical Care Time devoted to patient care services described in this note is 35 minutes.   Alyson Reedy, M.D. [facility] Pulmonary/Critical Care Medicine. Pager: [phone]. After hours pager: [phone].

## 2012-08-16 NOTE — H&P (Signed)
Gary [name] is an 66 y.o. male.   Chief Complaint: enlarging periphepatic fluid vs enlarging pancreatic pseudocyst in need of drainage for diagnostic studies and decompression.  HISTORY OF PRESENT ILLNESS:   This is 66 year old male patient with a history of prostate cancer, hypertension, and alcohol abuse. Recently discharged from the hospital for alcohol withdrawal and abdominal pain secondary to incarcerated left inguinal hernia which was manually reduced by surgery. He was discharged home with planned followup for potential elective hernia repair. Per spouse, patient has had one alcoholic beverage since discharge. Presented to the emergency room on 11/12 with chief complaint of generalized weakness, and increasing fatigue. Also nonproductive cough. Also still complaining of abdominal pain both intermittently upper abdomen, and lower abdomen. Diagnostic evaluation was consistent with pancreatitis CT abdomen demonstrated what appears to be progressive pseudocyst he was admitted by medical services for supportive care. In addition he's had progressive bilateral lower extremity swelling and discomfort with erythema felt to be consistent with candida dermatitis. Critical care services were asked to see on 11/15, as the patient has become progressively lethargic, with poor cough mechanics, worsening abdominal pain, and decreased level of consciousness. Therapeutic interventions to date have included IV antibiotics, both surgical and GI consult services, as well as IV fluid resuscitation. Chest x-ray evaluation on 11/12 demonstrates progressive bibasilar right greater than left volume loss.  Past Medical History  Diagnosis Date  . Hypertension   . GERD (gastroesophageal reflux disease)   . Pneumonia     "5-7 years ago" (08/03/2012)  . Exertional dyspnea     "just recently" (08/03/2012)  . Delirium, acute 08/03/2012  . Prostate cancer     Past Surgical History  Procedure Date  . Prostate surgery   .  Hydrocele excision / repair 2010  . Inguinal hernia repair 12/08/2011    Procedure: HERNIA REPAIR INGUINAL ADULT;  Surgeon: Liz Malady, MD;  Location: [facility] OR;  Service: General;  Laterality: Right;  repair right inguinal hernia with mesh  . Irrigation and debridement abscess 03/01/2012    Procedure: IRRIGATION AND DEBRIDEMENT ABSCESS;  Surgeon: Ernestene Mention, MD;  Location: [facility] OR;  Service: General;  Laterality: Right;  Incision and drainage with debridement right breast abscess  . Tonsillectomy   . Cataract extraction w/ intraocular lens  implant, bilateral 2008    Family History  Problem Relation Age of Onset  . Heart disease Father    Social History:  reports that he has been smoking Cigarettes.  He has a 52 pack-year smoking history. He has never used smokeless tobacco. He reports that he drinks about 16.8 ounces of alcohol per week. He reports that he does not use illicit drugs.  Allergies: No Known Allergies  Medications Prior to Admission  Medication Sig Dispense Refill  . aspirin EC 81 MG tablet Take 162 mg by mouth daily.      . cholecalciferol (VITAMIN D) 1000 UNITS tablet Take 1,000 Units by mouth daily.      . ciprofloxacin (CIPRO) 500 MG tablet Take 1 tablet (500 mg total) by mouth 2 (two) times daily.  10 tablet  0  . ciprofloxacin (CIPRO) 500 MG tablet Take 500 mg by mouth 2 (two) times daily. For 10 days starting on 08/07/12      . feeding supplement (ENSURE COMPLETE) LIQD Take 237 mLs by mouth 2 (two) times daily between meals.  60 Bottle  0  . folic acid (FOLVITE) 1 MG tablet Take 1 tablet (1 mg total) by mouth  daily.  30 tablet  0  . guaiFENesin-dextromethorphan (ROBITUSSIN DM) 100-10 MG/5ML syrup Take 5 mLs by mouth every 4 (four) hours as needed for cough.  118 mL    . lisinopril (PRINIVIL,ZESTRIL) 10 MG tablet Take 10 mg by mouth daily.      . metroNIDAZOLE (FLAGYL) 500 MG tablet Take 500 mg by mouth 3 (three) times daily. For 10 days starting on 08/07/12      .  Multiple Vitamin (MULTIVITAMIN WITH MINERALS) TABS Take 1 tablet by mouth daily.  30 tablet  0  . omeprazole (PRILOSEC) 20 MG capsule Take 20 mg by mouth daily.      Marland Kitchen thiamine 100 MG tablet Take 1 tablet (100 mg total) by mouth daily.  30 tablet  0  . vitamin E 400 UNIT capsule Take 800 Units by mouth daily.        Results for orders placed during the hospital encounter of 2012-08-19 (from the past 48 hour(s))  BASIC METABOLIC PANEL     Status: Abnormal   Collection Time   08/15/12 11:46 AM      Component Value Range Comment   Sodium 138  135 - 145 mEq/L    Potassium 4.3  3.5 - 5.1 mEq/L    Chloride 102  96 - 112 mEq/L    CO2 23  19 - 32 mEq/L    Glucose, Bld 223 (*) 70 - 99 mg/dL    BUN 68 (*) 6 - 23 mg/dL    Creatinine, Ser 1.61 (*) 0.50 - 1.35 mg/dL    Calcium 7.6 (*) 8.4 - 10.5 mg/dL    GFR calc non Af Amer 24 (*) >90 mL/min    GFR calc Af Amer 27 (*) >90 mL/min   PROTIME-INR     Status: Abnormal   Collection Time   08/15/12 11:46 AM      Component Value Range Comment   Prothrombin Time 16.4 (*) 11.6 - 15.2 seconds    INR 1.35  0.00 - 1.49   APTT     Status: Normal   Collection Time   08/15/12 11:46 AM      Component Value Range Comment   aPTT 34  24 - 37 seconds   GLUCOSE, CAPILLARY     Status: Abnormal   Collection Time   08/15/12 12:05 PM      Component Value Range Comment   Glucose-Capillary 216 (*) 70 - 99 mg/dL   AMMONIA     Status: Abnormal   Collection Time   08/15/12 12:50 PM      Component Value Range Comment   Ammonia 70 (*) 11 - 60 umol/L   POCT I-STAT 3, BLOOD GAS (G3+)     Status: Abnormal   Collection Time   08/15/12  1:17 PM      Component Value Range Comment   pH, Arterial 7.381  7.350 - 7.450    pCO2 arterial 41.0  35.0 - 45.0 mmHg    pO2, Arterial 68.0 (*) 80.0 - 100.0 mmHg    Bicarbonate 24.3 (*) 20.0 - 24.0 mEq/L    TCO2 26  0 - 100 mmol/L    O2 Saturation 93.0      Acid-base deficit 1.0  0.0 - 2.0 mmol/L    Patient temperature 98.7 F       Collection site RADIAL, ALLEN'S TEST ACCEPTABLE      Drawn by Operator      Sample type ARTERIAL     GLUCOSE, CAPILLARY  Status: Abnormal   Collection Time   08/15/12  6:50 PM      Component Value Range Comment   Glucose-Capillary 229 (*) 70 - 99 mg/dL   RENAL FUNCTION PANEL     Status: Abnormal   Collection Time   08/15/12  6:54 PM      Component Value Range Comment   Sodium 140  135 - 145 mEq/L    Potassium 4.0  3.5 - 5.1 mEq/L    Chloride 102  96 - 112 mEq/L    CO2 21  19 - 32 mEq/L    Glucose, Bld 220 (*) 70 - 99 mg/dL    BUN 70 (*) 6 - 23 mg/dL    Creatinine, Ser 1.61 (*) 0.50 - 1.35 mg/dL    Calcium 7.0 (*) 8.4 - 10.5 mg/dL    Phosphorus 5.7 (*) 2.3 - 4.6 mg/dL    Albumin 1.2 (*) 3.5 - 5.2 g/dL    GFR calc non Af Amer 24 (*) >90 mL/min    GFR calc Af Amer 28 (*) >90 mL/min   GLUCOSE, CAPILLARY     Status: Abnormal   Collection Time   08/15/12  7:47 PM      Component Value Range Comment   Glucose-Capillary 198 (*) 70 - 99 mg/dL   GLUCOSE, CAPILLARY     Status: Abnormal   Collection Time   08/16/12 12:36 AM      Component Value Range Comment   Glucose-Capillary 152 (*) 70 - 99 mg/dL   BLOOD GAS, ARTERIAL     Status: Abnormal   Collection Time   08/16/12  3:40 AM      Component Value Range Comment   FIO2 0.60      Delivery systems VENTILATOR      Mode PRESSURE REGULATED VOLUME CONTROL      VT 470      Rate 30      Peep/cpap 10.0      pH, Arterial 7.391  7.350 - 7.450    pCO2 arterial 34.9 (*) 35.0 - 45.0 mmHg    pO2, Arterial 62.8 (*) 80.0 - 100.0 mmHg    Bicarbonate 20.9  20.0 - 24.0 mEq/L    TCO2 22.0  0 - 100 mmol/L    Acid-base deficit 3.4 (*) 0.0 - 2.0 mmol/L    O2 Saturation 92.4      Patient temperature 97.3      Collection site A-LINE      Drawn by [id number]      Sample type ARTERIAL DRAW      Allens test (pass/fail) PASS  PASS   GLUCOSE, CAPILLARY     Status: Abnormal   Collection Time   08/16/12  4:53 AM      Component Value Range Comment    Glucose-Capillary >600 (*) 70 - 99 mg/dL   GLUCOSE, CAPILLARY     Status: Abnormal   Collection Time   08/16/12  4:56 AM      Component Value Range Comment   Glucose-Capillary 149 (*) 70 - 99 mg/dL   VANCOMYCIN, RANDOM     Status: Normal   Collection Time   08/16/12  5:00 AM      Component Value Range Comment   Vancomycin Rm 16.3     CBC     Status: Abnormal   Collection Time   08/16/12  5:00 AM      Component Value Range Comment   WBC 30.5 (*) 4.0 - 10.5 K/uL  RBC 3.33 (*) 4.22 - 5.81 MIL/uL    Hemoglobin 11.8 (*) 13.0 - 17.0 g/dL    HCT 40.9 (*) 81.1 - 52.0 %    MCV 102.4 (*) 78.0 - 100.0 fL    MCH 35.4 (*) 26.0 - 34.0 pg    MCHC 34.6  30.0 - 36.0 g/dL    RDW 91.4 (*) 78.2 - 15.5 %    Platelets 232  150 - 400 K/uL   AMMONIA     Status: Normal   Collection Time   08/16/12  5:00 AM      Component Value Range Comment   Ammonia 32  11 - 60 umol/L   MAGNESIUM     Status: Normal   Collection Time   08/16/12  5:00 AM      Component Value Range Comment   Magnesium 2.0  1.5 - 2.5 mg/dL   RENAL FUNCTION PANEL     Status: Abnormal   Collection Time   08/16/12  5:00 AM      Component Value Range Comment   Sodium 138  135 - 145 mEq/L    Potassium 3.3 (*) 3.5 - 5.1 mEq/L    Chloride 87 (*) 96 - 112 mEq/L    CO2 >45 (*) 19 - 32 mEq/L    Glucose, Bld 784 (*) 70 - 99 mg/dL    BUN 47 (*) 6 - 23 mg/dL    Creatinine, Ser 9.56 (*) 0.50 - 1.35 mg/dL    Calcium 6.0 (*) 8.4 - 10.5 mg/dL    Phosphorus 4.0  2.3 - 4.6 mg/dL    Albumin 1.1 (*) 3.5 - 5.2 g/dL    GFR calc non Af Amer 39 (*) >90 mL/min    GFR calc Af Amer 46 (*) >90 mL/min   GLUCOSE, RANDOM     Status: Abnormal   Collection Time   08/16/12  6:20 AM      Component Value Range Comment   Glucose, Bld 181 (*) 70 - 99 mg/dL   GLUCOSE, CAPILLARY     Status: Abnormal   Collection Time   08/16/12  7:34 AM      Component Value Range Comment   Glucose-Capillary 153 (*) 70 - 99 mg/dL   LIPASE, BLOOD     Status: Abnormal    Collection Time   08/16/12  9:30 AM      Component Value Range Comment   Lipase 2083 (*) 11 - 59 U/L   HEPATIC FUNCTION PANEL     Status: Abnormal   Collection Time   08/16/12  9:30 AM      Component Value Range Comment   Total Protein 5.9 (*) 6.0 - 8.3 g/dL    Albumin 1.3 (*) 3.5 - 5.2 g/dL    AST 87 (*) 0 - 37 U/L    ALT 34  0 - 53 U/L    Alkaline Phosphatase 207 (*) 39 - 117 U/L    Total Bilirubin 1.8 (*) 0.3 - 1.2 mg/dL    Bilirubin, Direct 1.4 (*) 0.0 - 0.3 mg/dL    Indirect Bilirubin 0.4  0.3 - 0.9 mg/dL   GLUCOSE, CAPILLARY     Status: Abnormal   Collection Time   08/16/12 11:41 AM      Component Value Range Comment   Glucose-Capillary 133 (*) 70 - 99 mg/dL    Comment 1 Documented in Chart      Comment 2 Notify RN     POCT I-STAT 3, BLOOD GAS (G3+)  Status: Abnormal   Collection Time   08/16/12 11:43 AM      Component Value Range Comment   pH, Arterial 7.457 (*) 7.350 - 7.450    pCO2 arterial 35.7  35.0 - 45.0 mmHg    pO2, Arterial 61.0 (*) 80.0 - 100.0 mmHg    Bicarbonate 25.4 (*) 20.0 - 24.0 mEq/L    TCO2 27  0 - 100 mmol/L    O2 Saturation 93.0      Acid-Base Excess 2.0  0.0 - 2.0 mmol/L    Patient temperature 97.3 F      Sample type ARTERIAL     GLUCOSE, CAPILLARY     Status: Abnormal   Collection Time   08/16/12  3:56 PM      Component Value Range Comment   Glucose-Capillary 149 (*) 70 - 99 mg/dL    Comment 1 Documented in Chart      Comment 2 Notify RN     RENAL FUNCTION PANEL     Status: Abnormal   Collection Time   08/16/12  4:00 PM      Component Value Range Comment   Sodium 137  135 - 145 mEq/L    Potassium 4.5  3.5 - 5.1 mEq/L    Chloride 100  96 - 112 mEq/L DELTA CHECK NOTED   CO2 21  19 - 32 mEq/L    Glucose, Bld 142 (*) 70 - 99 mg/dL    BUN 54 (*) 6 - 23 mg/dL    Creatinine, Ser 1.61 (*) 0.50 - 1.35 mg/dL    Calcium 7.8 (*) 8.4 - 10.5 mg/dL    Phosphorus 5.6 (*) 2.3 - 4.6 mg/dL    Albumin 1.4 (*) 3.5 - 5.2 g/dL    GFR calc non Af Amer  29 (*) >90 mL/min    GFR calc Af Amer 33 (*) >90 mL/min   GLUCOSE, CAPILLARY     Status: Abnormal   Collection Time   08/16/12  7:34 PM      Component Value Range Comment   Glucose-Capillary 125 (*) 70 - 99 mg/dL   POCT I-STAT 3, BLOOD GAS (G3+)     Status: Abnormal   Collection Time   08/16/12 11:54 PM      Component Value Range Comment   pH, Arterial 7.352  7.350 - 7.450    pCO2 arterial 40.6  35.0 - 45.0 mmHg    pO2, Arterial 63.0 (*) 80.0 - 100.0 mmHg    Bicarbonate 22.6  20.0 - 24.0 mEq/L    TCO2 24  0 - 100 mmol/L    O2 Saturation 91.0      Acid-base deficit 3.0 (*) 0.0 - 2.0 mmol/L    Patient temperature 98.0 F      Collection site RADIAL, ALLEN'S TEST ACCEPTABLE      Drawn by RT      Sample type ARTERIAL     GLUCOSE, CAPILLARY     Status: Abnormal   Collection Time   08/16/12 11:54 PM      Component Value Range Comment   Glucose-Capillary 148 (*) 70 - 99 mg/dL   RENAL FUNCTION PANEL     Status: Abnormal   Collection Time   08/17/12  4:10 AM      Component Value Range Comment   Sodium 137  135 - 145 mEq/L    Potassium 4.7  3.5 - 5.1 mEq/L    Chloride 99  96 - 112 mEq/L    CO2 21  19 -  32 mEq/L    Glucose, Bld 177 (*) 70 - 99 mg/dL    BUN 54 (*) 6 - 23 mg/dL    Creatinine, Ser 0.86 (*) 0.50 - 1.35 mg/dL    Calcium 7.5 (*) 8.4 - 10.5 mg/dL    Phosphorus 5.9 (*) 2.3 - 4.6 mg/dL    Albumin 3.5  3.5 - 5.2 g/dL    GFR calc non Af Amer 31 (*) >90 mL/min    GFR calc Af Amer 36 (*) >90 mL/min   LIPASE, BLOOD     Status: Abnormal   Collection Time   08/17/12  4:10 AM      Component Value Range Comment   Lipase 2399 (*) 11 - 59 U/L   MAGNESIUM     Status: Normal   Collection Time   08/17/12  4:10 AM      Component Value Range Comment   Magnesium 2.5  1.5 - 2.5 mg/dL   CBC WITH DIFFERENTIAL     Status: Abnormal   Collection Time   08/17/12  4:11 AM      Component Value Range Comment   WBC 29.7 (*) 4.0 - 10.5 K/uL    RBC 3.21 (*) 4.22 - 5.81 MIL/uL    Hemoglobin  11.3 (*) 13.0 - 17.0 g/dL    HCT 57.8 (*) 46.9 - 52.0 %    MCV 101.6 (*) 78.0 - 100.0 fL    MCH 35.2 (*) 26.0 - 34.0 pg    MCHC 34.7  30.0 - 36.0 g/dL    RDW 62.9 (*) 52.8 - 15.5 %    Platelets 112 (*) 150 - 400 K/uL    Neutrophils Relative 93 (*) 43 - 77 %    Lymphocytes Relative 3 (*) 12 - 46 %    Monocytes Relative 4  3 - 12 %    Eosinophils Relative 0  0 - 5 %    Basophils Relative 0  0 - 1 %    Neutro Abs 27.6 (*) 1.7 - 7.7 K/uL    Lymphs Abs 0.9  0.7 - 4.0 K/uL    Monocytes Absolute 1.2 (*) 0.1 - 1.0 K/uL    Eosinophils Absolute 0.0  0.0 - 0.7 K/uL    Basophils Absolute 0.0  0.0 - 0.1 K/uL    RBC Morphology RARE NRBCs      WBC Morphology MILD LEFT SHIFT (1-5% METAS, OCC MYELO, OCC BANDS)   TOXIC GRANULATION  GLUCOSE, CAPILLARY     Status: Abnormal   Collection Time   08/17/12  4:12 AM      Component Value Range Comment   Glucose-Capillary 153 (*) 70 - 99 mg/dL   GLUCOSE, CAPILLARY     Status: Abnormal   Collection Time   08/17/12  7:46 AM      Component Value Range Comment   Glucose-Capillary 157 (*) 70 - 99 mg/dL    Comment 1 Notify RN      Comment 2 Documented in Chart     POCT I-STAT 3, BLOOD GAS (G3+)     Status: Abnormal   Collection Time   08/17/12  9:17 AM      Component Value Range Comment   pH, Arterial 7.432  7.350 - 7.450    pCO2 arterial 34.0 (*) 35.0 - 45.0 mmHg    pO2, Arterial 69.0 (*) 80.0 - 100.0 mmHg    Bicarbonate 22.8  20.0 - 24.0 mEq/L    TCO2 24  0 - 100 mmol/L  O2 Saturation 95.0      Acid-base deficit 1.0  0.0 - 2.0 mmol/L    Patient temperature 97.5 F      Collection site ARTERIAL LINE      Drawn by Operator      Sample type ARTERIAL      Dg Chest Port 1 View  08/16/2012  *RADIOLOGY REPORT*  Clinical Data: Intubated patient. Pancreatitis.  PORTABLE CHEST - 1 VIEW  Comparison: Single view of the chest 08/15/2012.  Findings: The patient is rotated on the study.  Support tubes and lines are unchanged.  Large right pleural effusion and  extensive airspace disease persist.  Small left effusion and basilar airspace disease are unchanged.  No pneumothorax is identified.  IMPRESSION: No change in right much worse than left pleural effusions and airspace disease.   Original Report Authenticated By: Holley Dexter, M.D.    Dg Chest Port 1 View  08/15/2012  *RADIOLOGY REPORT*  Clinical Data: Post dialysis catheter placement.  PORTABLE CHEST - 1 VIEW  Comparison: Portable exam 1700 hours compared to the 0705 hours  Findings: New large bore left jugular central venous catheter tip projecting over SVC. Endotracheal and nasogastric tubes unchanged. Stable heart size with slight vascular congestion. Persistent large right pleural effusion with slight mediastinal shift to the left. Persistent left lower lobe atelectasis versus consolidation. No pneumothorax.  IMPRESSION: No pneumothorax following central line placement. Otherwise no change.   Original Report Authenticated By: Ulyses Southward, M.D.    *RADIOLOGY REPORT*  Clinical Data: Abdominal pain, weakness, abdominal distention and  pancreatitis.  CT ABDOMEN AND PELVIS WITHOUT CONTRAST  Technique: Multidetector CT imaging of the abdomen and pelvis was  performed following the standard protocol without intravenous  contrast.  Comparison: CT of the abdomen and pelvis dated 08/03/2012 and  abdominal ultrasound earlier today.  Findings: Visualized lung bases show a small right pleural effusion  and atelectasis of the right lower lobe.  Consistent with ultrasound findings, there is significant  enlargement of loculated perihepatic fluid collections which may be  subcapsular and are exerting significant mass effect on the liver.  Adjacent peripancreatic fluid collections are also identified  extending into the porta hepatis and findings are most likely  related to enlarging loculated pseudocysts. There is no suggestion  of abscess with internal fluid density suggesting simple fluid. No  air is  identified in any of the loculated fluid collections.  Coarse calcifications in the head of the pancreas are again  identified consistent with chronic pancreatitis. No significant  peripancreatic inflammatory changes are identified. The  gallbladder contains high density material consistent with  gallstones and sludge identified by ultrasound. There is no  evidence of biliary ductal dilatation.  The spleen is normal in size. The kidneys are unremarkable. There  is no evidence of bowel obstruction. A small amount of ascites is  present in the pelvis. There is stranding and nodularity present  in the mesentery and omentum which is nonspecific, and may simply  relate to edema and prominent vessels. A component of potential  carcinomatosis cannot be entirely excluded, especially given more  normal appearance of the mesenteric and omental fat on a prior CT  in 2012. No discrete masses or enlarged lymph nodes are identified.  There is a stable left inguinal hernia. No evidence of  incarcerated bowel. The bladder is unremarkable.  IMPRESSION:  1. Significant enlargement of peripancreatic fluid collections  which appear at least partially subcapsular and exert significant  mass effect on both right  and left lobes of the liver. Given  history of pancreatitis and appearance of the prior CT 7 days ago,  these are suggestive of enlarging pseudocysts.  2. Nonspecific nodularity and edema within the mesentery and  omentum without discrete focal mass. This may simply relate to  edema and hypoproteinemia. However, follow-up would be indicated  to exclude any progressive evidence of carcinomatosis.  3. Stable left inguinal hernia.  Original Report Authenticated By: Irish Lack, M.D.   ROS- see above - unable to obtain as patient intubated and sedated.   Blood pressure 127/64, pulse 81, temperature 97.5 F (36.4 C), temperature source Oral, resp. rate 30, height 5\' 4"  (1.626 m), weight 209 lb 3.5  oz (94.9 kg), SpO2 100.00%. Physical Exam  Constitutional: He appears well-developed.  HENT:       Intubated and sedated   Cardiovascular: Normal rate.  Exam reveals no gallop and no friction rub.   No murmur heard. Respiratory:       Decreased on left - for thoracentesis   GI: He exhibits distension.       Decreased BS   Neurological:       Sedated on vent   Skin: Skin is warm and dry.     Assessment/Plan Procedure details discussed with spouse and family in details.  Percutaneous drainage of this collection would allow for decompression as well as yield for diagnostic studies to be performed as requested by surgery. Procedure details, risks and benefits discussed with them with their apparent understanding. Written consent obtained.   Kenasia Scheller D 08/17/2012, 10:51 AM

## 2012-08-16 NOTE — Progress Notes (Signed)
CRITICAL VALUE ALERT  Critical value received:  Calcium 6   Date of notification:  08/16/2012  Time of notification:  0615   Critical value read back:yes  Nurse who received alert:  [place] Perkins  MD notified (1st page):  Dr Vassie Loll  Time of first page:  0630  MD notified (2nd page):  Time of second page:  Responding MD:  Dr. Vassie Loll  Time MD responded:  0630  Perkins, [place] Elizabeth

## 2012-08-16 NOTE — Progress Notes (Signed)
Talked with Dr. Fredia Sorrow in radiology who thought that percutaneous drainage of the peri-hepatic fluid collection would be easy.  I do not believe t hat the patient has abdominal compartment syndrome, but he could have abdominal hypertension.  Drainage of the fluid collection could decrease the abdominal pressure, but can also tell us if this is from the pancreas.  Marta Lamas. Gae Bon, MD, FACS [phone] [facility] Surgery

## 2012-08-16 NOTE — Progress Notes (Signed)
Patient ID: Gary [name], male   DOB: [birth year], 66 y.o.   MRN: [id number]    Subjective: Pt is sedated right now after being given versed.  Still intubated on CVVHD and on 2 pressors.  Objective: Vital signs in last 24 hours: Temp:  [97.2 F (36.2 C)-97.6 F (36.4 C)] 97.3 F (36.3 C) (11/18 0800) Pulse Rate:  [56-117] 103  (11/18 0847) Resp:  [0-35] 35  (11/18 0847) BP: (84-141)/(59-85) 93/60 mmHg (11/18 0847) SpO2:  [88 %-97 %] 95 % (11/18 0847) Arterial Line BP: (88-158)/(56-89) 88/58 mmHg (11/18 0830) FiO2 (%):  [60 %-75 %] 60 % (11/18 0847) Weight:  [212 lb 1.3 oz (96.2 kg)] 212 lb 1.3 oz (96.2 kg) (11/18 0500) Last BM Date: 08/15/12  Intake/Output from previous day: 11/17 0701 - 11/18 0700 In: 3796.2 [I.V.:3336.2; NG/GT:60; IV Piggyback:400] Out: 3312 [Urine:49; Emesis/NG output:50] Intake/Output this shift: Total I/O In: 167.7 [I.V.:147.7; NG/GT:20] Out: 116 [Other:116]  PE: Abd: tight, distended, abdominal anasarca, absent bowel sounds, voluntarily guards with palpation to abdomen.  Difficult to localize given he is mostly sedated right now.  Lab Results:   Basename 08/16/12 0500 08/15/12 0355  WBC 30.5* 17.2*  HGB 11.8* 11.5*  HCT 34.1* 32.9*  PLT 232 220   BMET  Basename 08/16/12 0620 08/16/12 0500 08/15/12 1854  NA -- 138 140  K -- 3.3* 4.0  CL -- 87* 102  CO2 -- >45* 21  GLUCOSE 181* 784* --  BUN -- 47* 70*  CREATININE -- 1.72* 2.55*  CALCIUM -- 6.0* 7.0*   PT/INR  Basename 08/15/12 1146 08/13/12 1200  LABPROT 16.4* 19.6*  INR 1.35 1.72*   CMP     Component Value Date/Time   NA 138 08/16/2012 0500   K 3.3* 08/16/2012 0500   CL 87* 08/16/2012 0500   CO2 >45* 08/16/2012 0500   GLUCOSE 181* 08/16/2012 0620   BUN 47* 08/16/2012 0500   CREATININE 1.72* 08/16/2012 0500   CALCIUM 6.0* 08/16/2012 0500   PROT 5.5* 08/14/2012 0512   ALBUMIN 1.1* 08/16/2012 0500   AST 109* 08/14/2012 0512   ALT 42 08/14/2012 0512   ALKPHOS 187* 08/14/2012  0512   BILITOT 1.7* 08/14/2012 0512   GFRNONAA 39* 08/16/2012 0500   GFRAA 46* 08/16/2012 0500   Lipase     Component Value Date/Time   LIPASE 1558* 08/14/2012 0512       Studies/Results: Dg Chest Port 1 View  08/16/2012  *RADIOLOGY REPORT*  Clinical Data: Intubated patient. Pancreatitis.  PORTABLE CHEST - 1 VIEW  Comparison: Single view of the chest 08/15/2012.  Findings: The patient is rotated on the study.  Support tubes and lines are unchanged.  Large right pleural effusion and extensive airspace disease persist.  Small left effusion and basilar airspace disease are unchanged.  No pneumothorax is identified.  IMPRESSION: No change in right much worse than left pleural effusions and airspace disease.   Original Report Authenticated By: Holley Dexter, M.D.    Dg Chest Port 1 View  08/15/2012  *RADIOLOGY REPORT*  Clinical Data: Post dialysis catheter placement.  PORTABLE CHEST - 1 VIEW  Comparison: Portable exam 1700 hours compared to the 0705 hours  Findings: New large bore left jugular central venous catheter tip projecting over SVC. Endotracheal and nasogastric tubes unchanged. Stable heart size with slight vascular congestion. Persistent large right pleural effusion with slight mediastinal shift to the left. Persistent left lower lobe atelectasis versus consolidation. No pneumothorax.  IMPRESSION: No pneumothorax following central line placement.  Otherwise no change.   Original Report Authenticated By: Ulyses Southward, M.D.    Dg Chest Port 1 View  08/15/2012  *RADIOLOGY REPORT*  Clinical Data: Check endotracheal tube placement.  PORTABLE CHEST - 1 VIEW  Comparison: 08/13/2012  Findings: Endotracheal tube is 3.9 cm above the carina.  There is extensive opacification throughout the right hemithorax which is new.  There is evidence for a large right pleural effusion.  PICC line tip in the SVC region.  The patient has low lung volumes. Nasogastric tube extends into the abdomen.  IMPRESSION:  Opacification of the right hemithorax.  Findings suggest development of a large right pleural effusion. There may be mild mediastinal shift towards the left but difficult to evaluate due to patient rotation.  Low lung volumes.  Difficult to exclude left pleural fluid.   Original Report Authenticated By: Richarda Overlie, M.D.     Anti-infectives: Anti-infectives     Start     Dose/Rate Route Frequency Ordered Stop   08/15/12 1300   imipenem-cilastatin (PRIMAXIN) 250 mg in sodium chloride 0.9 % 100 mL IVPB        250 mg 200 mL/hr over 30 Minutes Intravenous 4 times per day 08/15/12 1119     08/13/12 1400   vancomycin (VANCOCIN) 750 mg in sodium chloride 0.9 % 150 mL IVPB  Status:  Discontinued        750 mg 150 mL/hr over 60 Minutes Intravenous Every 12 hours 08/13/12 1254 08/15/12 1115   08/13/12 1400   imipenem-cilastatin (PRIMAXIN) 500 mg in sodium chloride 0.9 % 100 mL IVPB  Status:  Discontinued        500 mg 200 mL/hr over 30 Minutes Intravenous 3 times per day 08/13/12 1254 08/15/12 1119   08/12/12 1700   levofloxacin (LEVAQUIN) IVPB 750 mg  Status:  Discontinued        750 mg 100 mL/hr over 90 Minutes Intravenous Every 48 hours 2012/08/13 1749 08/12/12 1130   08/12/12 0600   vancomycin (VANCOCIN) IVPB 1000 mg/200 mL premix  Status:  Discontinued        1,000 mg 200 mL/hr over 60 Minutes Intravenous Every 24 hours 08/11/12 1141 08/12/12 1130   August 13, 2012 2200   piperacillin-tazobactam (ZOSYN) IVPB 3.375 g  Status:  Discontinued        3.375 g 12.5 mL/hr over 240 Minutes Intravenous Every 8 hours 08/13/12 1749 08/12/12 1130   2012/08/13 1800   vancomycin (VANCOCIN) 750 mg in sodium chloride 0.9 % 150 mL IVPB  Status:  Discontinued        750 mg 150 mL/hr over 60 Minutes Intravenous Every 12 hours 2012-08-13 1749 08/11/12 1141   August 13, 2012 1530   vancomycin (VANCOCIN) IVPB 1000 mg/200 mL premix  Status:  Discontinued        1,000 mg 200 mL/hr over 60 Minutes Intravenous  Once 13-Aug-2012 1528  2012-08-13 1808   08-13-12 1530  piperacillin-tazobactam (ZOSYN) IVPB 3.375 g       3.375 g 100 mL/hr over 30 Minutes Intravenous  Once 13-Aug-2012 1528 08/13/2012 1630   August 13, 2012 1530   levofloxacin (LEVAQUIN) IVPB 750 mg  Status:  Discontinued        750 mg 100 mL/hr over 90 Minutes Intravenous  Once 13-Aug-2012 1528 08/13/12 1808           Assessment/Plan  1. Pancreatitis 2. ETOH abuse 3. Cholelithiasis 4. ARF, improving 5. Leukocytosis, increasing  6. Enlarging peripancreatic fluid collections 7. Stable inguinal hernia  Plan: 1. Will  check a lipase level today since one has not been checked in 3 days and it was rising 3 days ago.  His WBC is increasing, will follow this. 2. No acute surgical indications at this time. 3. Cont current care.  LOS: 6 days    Chavon Lucarelli E 08/16/2012, 8:55 AM Pager: [phone]

## 2012-08-16 NOTE — Progress Notes (Signed)
ANTIBIOTIC CONSULT NOTE - FOLLOW UP  Pharmacy Consult for imipenem, vancomycin, micafungin Indication: rule out pneumonia, rule out sepsis, pancreatitis, cellulitis, candida  No Known Allergies  Patient Measurements: Height: 5\' 4"  (162.6 cm) Weight: 212 lb 1.3 oz (96.2 kg) IBW/kg (Calculated) : 59.2   Vital Signs: Temp: 97.3 F (36.3 C) (11/18 0800) Temp src: Oral (11/18 0800) BP: 93/60 mmHg (11/18 0847) Pulse Rate: 103  (11/18 0900) Intake/Output from previous day: 11/17 0701 - 11/18 0700 In: 3796.2 [I.V.:3336.2; NG/GT:60; IV Piggyback:400] Out: 3312 [Urine:49; Emesis/NG output:50] Intake/Output from this shift: Total I/O In: 249.4 [I.V.:229.4; NG/GT:20] Out: 421 [Other:421]  Labs:  [facility] 08/16/12 0500 08/15/12 1854 08/15/12 1146 08/15/12 0355 08/14/12 0512  WBC 30.5* -- -- 17.2* 13.3*  HGB 11.8* -- -- 11.5* 11.8*  PLT 232 -- -- 220 252  LABCREA -- -- -- -- --  CREATININE 1.72* 2.55* 2.63* -- --   Estimated Creatinine Clearance: 43.6 ml/min (by C-G formula based on Cr of 1.72).  Basename 08/16/12 0500  VANCOTROUGH --  Leodis Binet --  VANCORANDOM 16.3  GENTTROUGH --  GENTPEAK --  GENTRANDOM --  TOBRATROUGH --  TOBRAPEAK --  TOBRARND --  AMIKACINPEAK --  AMIKACINTROU --  AMIKACIN --     Microbiology: Recent Results (from the past 720 hour(s))  URINE CULTURE     Status: Normal   Collection Time   08/03/12 11:19 AM      Component Value Range Status Comment   Specimen Description URINE, CLEAN CATCH   Final    Special Requests NONE   Final    Culture  Setup Time 08/04/2012 01:55   Final    Colony Count NO GROWTH   Final    Culture NO GROWTH   Final    Report Status 08/04/2012 FINAL   Final   CULTURE, BLOOD (ROUTINE X 2)     Status: Normal   Collection Time   08/03/12  1:00 PM      Component Value Range Status Comment   Specimen Description Blood   Final    Special Requests NONE   Final    Culture  Setup Time 08/03/2012 15:36   Final    Culture NO  GROWTH 5 DAYS   Final    Report Status 08/09/2012 FINAL   Final   CULTURE, BLOOD (ROUTINE X 2)     Status: Normal   Collection Time   08/03/12  1:05 PM      Component Value Range Status Comment   Specimen Description Blood   Final    Special Requests NONE   Final    Culture  Setup Time 08/03/2012 15:36   Final    Culture NO GROWTH 5 DAYS   Final    Report Status 08/09/2012 FINAL   Final   CULTURE, BLOOD (ROUTINE X 2)     Status: Normal   Collection Time   08/24/2012  3:41 PM      Component Value Range Status Comment   Specimen Description BLOOD HAND RIGHT   Final    Special Requests BOTTLES DRAWN AEROBIC ONLY 5CC   Final    Culture  Setup Time 08/04/2012 22:36   Final    Culture NO GROWTH 5 DAYS   Final    Report Status 08/16/2012 FINAL   Final   CULTURE, BLOOD (ROUTINE X 2)     Status: Normal   Collection Time   08/25/2012  3:50 PM      Component Value Range Status Comment  Specimen Description BLOOD HAND LEFT   Final    Special Requests BOTTLES DRAWN AEROBIC ONLY [facility]   Final    Culture  Setup Time 08/19/2012 22:37   Final    Culture NO GROWTH 5 DAYS   Final    Report Status 08/16/2012 FINAL   Final   URINE CULTURE     Status: Normal   Collection Time   08/11/12 12:43 PM      Component Value Range Status Comment   Specimen Description URINE, RANDOM   Final    Special Requests NONE   Final    Culture  Setup Time 08/11/2012 14:45   Final    Colony Count NO GROWTH   Final    Culture NO GROWTH   Final    Report Status 08/12/2012 FINAL   Final   CULTURE, RESPIRATORY     Status: Normal (Preliminary result)   Collection Time   08/14/12 11:01 PM      Component Value Range Status Comment   Specimen Description TRACHEAL ASPIRATE   Final    Special Requests Normal   Final    Gram Stain PENDING   Incomplete    Culture MODERATE CANDIDA ALBICANS   Final    Report Status PENDING   Incomplete     Anti-infectives     Start     Dose/Rate Route Frequency Ordered Stop   08/15/12 1300    imipenem-cilastatin (PRIMAXIN) 250 mg in sodium chloride 0.9 % 100 mL IVPB        250 mg 200 mL/hr over 30 Minutes Intravenous 4 times per day 08/15/12 1119     08/13/12 1400   vancomycin (VANCOCIN) 750 mg in sodium chloride 0.9 % 150 mL IVPB  Status:  Discontinued        750 mg 150 mL/hr over 60 Minutes Intravenous Every 12 hours 08/13/12 1254 08/15/12 1115   08/13/12 1400   imipenem-cilastatin (PRIMAXIN) 500 mg in sodium chloride 0.9 % 100 mL IVPB  Status:  Discontinued        500 mg 200 mL/hr over 30 Minutes Intravenous 3 times per day 08/13/12 1254 08/15/12 1119   08/12/12 1700   levofloxacin (LEVAQUIN) IVPB 750 mg  Status:  Discontinued        750 mg 100 mL/hr over 90 Minutes Intravenous Every 48 hours 08/19/2012 1749 08/12/12 1130   08/12/12 0600   vancomycin (VANCOCIN) IVPB 1000 mg/200 mL premix  Status:  Discontinued        1,000 mg 200 mL/hr over 60 Minutes Intravenous Every 24 hours 08/11/12 1141 08/12/12 1130   07/30/2012 2200   piperacillin-tazobactam (ZOSYN) IVPB 3.375 g  Status:  Discontinued        3.375 g 12.5 mL/hr over 240 Minutes Intravenous Every 8 hours 08/06/2012 1749 08/12/12 1130   08/15/2012 1800   vancomycin (VANCOCIN) 750 mg in sodium chloride 0.9 % 150 mL IVPB  Status:  Discontinued        750 mg 150 mL/hr over 60 Minutes Intravenous Every 12 hours 07/31/2012 1749 08/11/12 1141   08/01/2012 1530   vancomycin (VANCOCIN) IVPB 1000 mg/200 mL premix  Status:  Discontinued        1,000 mg 200 mL/hr over 60 Minutes Intravenous  Once 08/13/2012 1528 08/02/2012 1808   08/26/2012 1530  piperacillin-tazobactam (ZOSYN) IVPB 3.375 g       3.375 g 100 mL/hr over 30 Minutes Intravenous  Once 08/11/2012 1528 08/20/2012 1630   08/21/2012 1530  levofloxacin (LEVAQUIN) IVPB 750 mg  Status:  Discontinued        750 mg 100 mL/hr over 90 Minutes Intravenous  Once 08/28/2012 1528 08/12/2012 1808          Assessment: 66 yo male on septic shock protocol and noted to have possible PNA,  pancreatitis with evolving pseudocyst and lower extremity cellulitis. He also has candida albicans growth in the respiratory culture.   Patient has been started on CVVH so vancomycin dose adjusted from previous dose. Current imipenem dose is appropriate for CVVH. WBC 30.5 which is a significant increase from 11/17 (17.2), and patient is afebrile.   11/13 urine culture:negative 11/12 blood culture x 2: negative 11/16 respiratory: moderate candida albicans  Imipenem 11/15>> Vanc 11/12>>11/14>>11/15>> (held on 11/17 due to concern for accumulation) Random vanc 11/18: 16.3 mcg/ml Micafungin 11/18>> Zosyn 11/12>>11/15 Levaquin 11/12>>11/13   Goal of Therapy:  Vancomycin trough level 15-20 mcg/ml  Plan:  Restart vancomycin at 1000 mg IV q24h Continue imipenem 250 mg IV q6h Start micafungin 100 mg IV q24h Follow up cultures and sensitivities Monitor renal function and follow up CVVH length of therapy  Juanita Craver, PharmD Student  Thank you for allowing pharmacy to be a part of this patients care team.  Lovenia Kim Pharm.D., BCPS Clinical Pharmacist 08/16/2012 2:29 PM Pager: [phone] Phone: [phone]

## 2012-08-16 NOTE — Progress Notes (Signed)
eLink Physician-Brief Progress Note Patient Name: RICHARDS SNIPE DOB: 01-08-1946 MRN: [id number]  Date of Service  08/16/2012   HPI/Events of Note   Hypotension, currently on pressors and CVVHD removing fluid at 200cc/h  eICU Interventions  Stop fluid removal, increase pressor support and administer albumin      Janine Reller 08/16/2012, 5:39 PM

## 2012-08-16 NOTE — Progress Notes (Signed)
Patient ID: Gary [name], male   DOB: 1946-01-28, 66 y.o.   MRN: [id number]   [facility] Progress Note    Subjective:   CVVH started late yesterday and able to tolerate UF rate of -149ml/hr. Discussed with Dr.Yacoub and hypervolemia raised as a concern.   Objective:   BP 93/60  Pulse 103  Temp 97.3 F (36.3 C) (Oral)  Resp 15  Ht 5\' 4"  (1.626 m)  Wt 96.2 kg (212 lb 1.3 oz)  BMI 36.40 kg/m2  SpO2 96%  Intake/Output Summary (Last 24 hours) at 08/16/12 0925 Last data filed at 08/16/12 0900  Gross per 24 hour  Intake 3742.27 ml  Output   3728 ml  Net  14.27 ml   Weight change: 1.8 kg (3 lb 15.5 oz)  Physical Exam: [id number] on vent- arouses to pain/voice QMV:HQION regular tachycardia, normal S1 and S2  Resp:Decreased BS over bases- no rales Abd: Firm, distended, BS distant Ext:2+ anasarca  Imaging: Dg Chest Port 1 View  08/16/2012  *RADIOLOGY REPORT*  Clinical Data: Intubated patient. Pancreatitis.  PORTABLE CHEST - 1 VIEW  Comparison: Single view of the chest 08/15/2012.  Findings: The patient is rotated on the study.  Support tubes and lines are unchanged.  Large right pleural effusion and extensive airspace disease persist.  Small left effusion and basilar airspace disease are unchanged.  No pneumothorax is identified.  IMPRESSION: No change in right much worse than left pleural effusions and airspace disease.   Original Report Authenticated By: Holley Dexter, M.D.    Dg Chest Port 1 View  08/15/2012  *RADIOLOGY REPORT*  Clinical Data: Post dialysis catheter placement.  PORTABLE CHEST - 1 VIEW  Comparison: Portable exam 1700 hours compared to the 0705 hours  Findings: New large bore left jugular central venous catheter tip projecting over SVC. Endotracheal and nasogastric tubes unchanged. Stable heart size with slight vascular congestion. Persistent large right pleural effusion with slight mediastinal shift to the left. Persistent left lower lobe  atelectasis versus consolidation. No pneumothorax.  IMPRESSION: No pneumothorax following central line placement. Otherwise no change.   Original Report Authenticated By: Ulyses Southward, M.D.    Dg Chest Port 1 View  08/15/2012  *RADIOLOGY REPORT*  Clinical Data: Check endotracheal tube placement.  PORTABLE CHEST - 1 VIEW  Comparison: 08/13/2012  Findings: Endotracheal tube is 3.9 cm above the carina.  There is extensive opacification throughout the right hemithorax which is new.  There is evidence for a large right pleural effusion.  PICC line tip in the SVC region.  The patient has low lung volumes. Nasogastric tube extends into the abdomen.  IMPRESSION: Opacification of the right hemithorax.  Findings suggest development of a large right pleural effusion. There may be mild mediastinal shift towards the left but difficult to evaluate due to patient rotation.  Low lung volumes.  Difficult to exclude left pleural fluid.   Original Report Authenticated By: Richarda Overlie, M.D.     Labs: BMET  Lab 08/16/12 6295 08/16/12 0500 08/15/12 1854 08/15/12 1146 08/15/12 0355 08/14/12 2007 08/14/12 1207 08/14/12 0512 08/12/12 0510  NA -- 138 140 138 136 140 139 138 --  K -- 3.3* 4.0 4.3 4.5 3.9 3.1* 3.9 --  CL -- 87* 102 102 103 106 101 108 --  CO2 -- >45* 21 23 20 20  32 18* --  GLUCOSE 181* 784* 220* 223* 219* 181* 533* -- --  BUN -- 47* 70* 68* 65* 61* 54* 58* --  CREATININE -- 1.72*  2.55* 2.63* 2.35* 2.13* 1.68* 1.67* --  ALB -- -- -- -- -- -- -- -- --  CALCIUM -- 6.0* 7.0* 7.6* 7.9* 7.8* 6.5* 7.7* --  PHOS -- 4.0 5.7* -- 4.7* -- -- -- 4.5   CBC  Lab 08/16/12 0500 08/15/12 0355 08/14/12 0512 08/13/12 0454 09-07-12 1319  WBC 30.5* 17.2* 13.3* 16.7* --  NEUTROABS -- -- -- -- 22.6*  HGB 11.8* 11.5* 11.8* 12.3* --  HCT 34.1* 32.9* 33.9* 35.1* --  MCV 102.4* 102.8* 103.4* 101.2* --  PLT 232 220 252 303 --    Medications:      . albuterol  6 puff Inhalation Q6H  . antiseptic oral rinse  15 mL Mouth  Rinse QID  . chlorhexidine  15 mL Mouth/Throat BID  . heparin subcutaneous  5,000 Units Subcutaneous Q8H  . hydrocortisone sod succinate (SOLU-CORTEF) injection  50 mg Intravenous Q6H  . imipenem-cilastatin  250 mg Intravenous Q6H  . insulin aspart  0-9 Units Subcutaneous Q4H  . ketoconazole   Topical BID  . pantoprazole (PROTONIX) IV  40 mg Intravenous Q24H  . sodium chloride  10-40 mL Intracatheter Q12H  . sodium chloride  3 mL Intravenous Q12H  . [DISCONTINUED] albuterol  2.5 mg Nebulization Q6H  . [DISCONTINUED] enoxaparin (LOVENOX) injection  40 mg Subcutaneous Q24H  . [DISCONTINUED] imipenem-cilastatin  500 mg Intravenous Q8H  . [DISCONTINUED] vancomycin  750 mg Intravenous Q12H     Assessment/ Plan:   1. ARF, oliguric- most consistent with ischemic ATN, on the ddx would be AIN, abdominal compartment syndrome and HRS. Currently on CRRT and will increase net fluid removal rate. Will stop piggyback bicarbonate drip with current BMET showing CO2>45. On low dose pressors.  2. VDRF- per PCCM, will increase UF for pulmonary unloading and facilitating wean 3. SIRS secondary to pancreatitis- on low dose pressors and CRRT support as above, on multiple pressors. Off BSABx. 4. Acute on chronic pancreatitis- cont with supportive care 5. Ascites with H/O cirrhosis- would refrain from large volume paracentesis given hemodynamic instability.    Zetta Bills, MD 08/16/2012, 9:25 AM

## 2012-08-17 ENCOUNTER — Inpatient Hospital Stay (HOSPITAL_COMMUNITY): Payer: Medicare Other

## 2012-08-17 LAB — CBC WITH DIFFERENTIAL/PLATELET
Basophils Absolute: 0 K/uL (ref 0.0–0.1)
Basophils Relative: 0 % (ref 0–1)
Eosinophils Absolute: 0 K/uL (ref 0.0–0.7)
Eosinophils Relative: 0 % (ref 0–5)
HCT: 32.6 % — ABNORMAL LOW (ref 39.0–52.0)
Hemoglobin: 11.3 g/dL — ABNORMAL LOW (ref 13.0–17.0)
Lymphocytes Relative: 3 % — ABNORMAL LOW (ref 12–46)
Lymphs Abs: 0.9 K/uL (ref 0.7–4.0)
MCH: 35.2 pg — ABNORMAL HIGH (ref 26.0–34.0)
MCHC: 34.7 g/dL (ref 30.0–36.0)
MCV: 101.6 fL — ABNORMAL HIGH (ref 78.0–100.0)
Monocytes Absolute: 1.2 K/uL — ABNORMAL HIGH (ref 0.1–1.0)
Monocytes Relative: 4 % (ref 3–12)
Neutro Abs: 27.6 K/uL — ABNORMAL HIGH (ref 1.7–7.7)
Neutrophils Relative %: 93 % — ABNORMAL HIGH (ref 43–77)
Platelets: 112 K/uL — ABNORMAL LOW (ref 150–400)
RBC: 3.21 MIL/uL — ABNORMAL LOW (ref 4.22–5.81)
RDW: 16.1 % — ABNORMAL HIGH (ref 11.5–15.5)
WBC: 29.7 K/uL — ABNORMAL HIGH (ref 4.0–10.5)

## 2012-08-17 LAB — RENAL FUNCTION PANEL
Albumin: 3.5 g/dL (ref 3.5–5.2)
BUN: 54 mg/dL — ABNORMAL HIGH (ref 6–23)
BUN: 57 mg/dL — ABNORMAL HIGH (ref 6–23)
CO2: 21 meq/L (ref 19–32)
CO2: 22 mEq/L (ref 19–32)
Calcium: 7.5 mg/dL — ABNORMAL LOW (ref 8.4–10.5)
Calcium: 6.7 mg/dL — ABNORMAL LOW (ref 8.4–10.5)
Chloride: 99 meq/L (ref 96–112)
Chloride: 100 mEq/L (ref 96–112)
Creatinine, Ser: 2.1 mg/dL — ABNORMAL HIGH (ref 0.50–1.35)
Creatinine, Ser: 2.17 mg/dL — ABNORMAL HIGH (ref 0.50–1.35)
GFR calc Af Amer: 36 mL/min — ABNORMAL LOW
GFR calc non Af Amer: 31 mL/min — ABNORMAL LOW
GFR calc non Af Amer: 30 mL/min — ABNORMAL LOW (ref >90)
Glucose, Bld: 177 mg/dL — ABNORMAL HIGH (ref 70–99)
Glucose, Bld: 172 mg/dL — ABNORMAL HIGH (ref 70–99)
Phosphorus: 5.9 mg/dL — ABNORMAL HIGH (ref 2.3–4.6)
Potassium: 4.7 meq/L (ref 3.5–5.1)
Sodium: 137 meq/L (ref 135–145)

## 2012-08-17 LAB — MAGNESIUM: Magnesium: 2.5 mg/dL (ref 1.5–2.5)

## 2012-08-17 LAB — BODY FLUID CELL COUNT WITH DIFFERENTIAL: Neutrophil Count, Fluid: 74 % — ABNORMAL HIGH (ref 0–25)

## 2012-08-17 LAB — POCT I-STAT 3, ART BLOOD GAS (G3+)
Acid-base deficit: 1 mmol/L (ref 0.0–2.0)
Bicarbonate: 22.8 mEq/L (ref 20.0–24.0)
O2 Saturation: 95 %
Patient temperature: 97.5
TCO2: 24 mmol/L (ref 0–100)
pO2, Arterial: 69 mmHg — ABNORMAL LOW (ref 80.0–100.0)

## 2012-08-17 LAB — GLUCOSE, CAPILLARY
Glucose-Capillary: 125 mg/dL — ABNORMAL HIGH (ref 70–99)
Glucose-Capillary: 153 mg/dL — ABNORMAL HIGH (ref 70–99)
Glucose-Capillary: 170 mg/dL — ABNORMAL HIGH (ref 70–99)
Glucose-Capillary: 158 mg/dL — ABNORMAL HIGH (ref 70–99)

## 2012-08-17 LAB — PROTEIN, BODY FLUID

## 2012-08-17 LAB — CULTURE, RESPIRATORY W GRAM STAIN

## 2012-08-17 LAB — LIPASE, BLOOD: Lipase: 2399 U/L — ABNORMAL HIGH (ref 11–59)

## 2012-08-17 LAB — LACTATE DEHYDROGENASE, PLEURAL OR PERITONEAL FLUID: LD, Fluid: 1738 U/L — ABNORMAL HIGH (ref 3–23)

## 2012-08-17 MED ORDER — LIDOCAINE HCL (PF) 1 % IJ SOLN
INTRAMUSCULAR | Status: AC
  Administered 2012-08-17: 09:00:00
  Filled 2012-08-17: qty 5

## 2012-08-17 MED ORDER — VANCOMYCIN HCL IN DEXTROSE 1-5 GM/200ML-% IV SOLN
1000.0000 mg | INTRAVENOUS | Status: DC
  Administered 2012-08-17 – 2012-08-19 (×3): 1000 mg via INTRAVENOUS
  Filled 2012-08-17 (×3): qty 200

## 2012-08-17 MED ORDER — SODIUM CHLORIDE 0.9 % IR SOLN
5.0000 mL | Freq: Three times a day (TID) | Status: DC
  Administered 2012-08-17 – 2012-08-22 (×12): 5 mL

## 2012-08-17 NOTE — Progress Notes (Signed)
Paracentesis today should be helpful.  Marta Lamas. Gae Bon, MD, FACS [phone] [facility]

## 2012-08-17 NOTE — Progress Notes (Signed)
PULMONARY  / CRITICAL CARE MEDICINE  Name: Gary [name] MRN: [id number] DOB: Mar 05, 1946    LOS: 7  REFERRING MD :  [name]  CHIEF COMPLAINT: encephalopathy, respiratory insufficiency  BRIEF PATIENT DESCRIPTION:  66 year old white male admitted 11/12 for abdominal pain in the setting of acute pancreatitis an incarcerated left inguinal hernia. Initially admitted to medicine service. Critical care asked to see on 11/15 for encephalopathy, and concern for progressive respiratory distress and inability to protect airway.  LINES / TUBES: RUE PICC line 11/15>>> L IJ HD Cath 11/17>>> R Rad Aline 11/16>>>  CULTURES: 11/16 Resp >>>CANDIDA ALBICANS 11/13 urine culture: Negative. 11/12 blood culture x2>>>Negative  ANTIBIOTICS: Levofloxacin 11/14>>>11/14>1 dose. Zosyn 11/12>>>11/14 Primaxin 11/15>>> Vancomycin 11/12>>>11/14>>> 11/15>>>  SIGNIFICANT EVENTS:  CT abdomen 11/12: Enlargement of. Pancreatic fluid collection with significant mass effect on both right and left lobe of liver. Suggesting enlarging pseudocysts. Nonspecific nodularity edema of the mesentery with no focal mass. Stable left inguinal hernia. Abdominal ultrasound 11/12: gallstone with echogenic sludge, but no evidence of biliary obstruction or acute cholecystitis. Increased ascites compared to CT 7 days prior fluid around the liver with mass effect. ECHO 11/12: Systolic function was normal. The estimated ejection fraction was in the range of 50% to 55%. There was an increased relative contribution of atrial contraction to ventricular filling.   Bilateral LE ultrasound in 11/15>>>> LEVEL OF CARE:  ICU PRIMARY SERVICE:  [facility] CONSULTANTS:  PCCM, nephro CODE STATUS full code DIET:  N.p.o. DVT Px:  Subcutaneous heparin GI Px: Protonix  HISTORY OF PRESENT ILLNESS:   This is 66 year old male patient with a history of prostate cancer, hypertension, and alcohol abuse. Recently discharged from the hospital for alcohol  withdrawal and abdominal pain secondary to incarcerated left inguinal hernia which was manually reduced by surgery. He was discharged home with planned followup for potential elective hernia repair. Per spouse, patient has had one alcoholic beverage since discharge. Presented to the emergency room on 11/12 with chief complaint of generalized weakness, and increasing fatigue. Also nonproductive cough. Also still complaining of abdominal pain both intermittently upper abdomen, and lower abdomen. Diagnostic evaluation was consistent with pancreatitis CT abdomen demonstrated what appears to be progressive pseudocyst he was admitted by medical services for supportive care. In addition he's had progressive bilateral lower extremity swelling and discomfort with erythema felt to be consistent with candida dermatitis. Critical care services were asked to see on 11/15, as the patient has become progressively lethargic, with poor cough mechanics, worsening abdominal pain, and decreased level of consciousness. Therapeutic interventions to date have included IV antibiotics, both surgical and GI consult services, as well as IV fluid resuscitation. Chest x-ray evaluation on 11/12 demonstrates progressive bibasilar right greater than left volume loss.  VITAL SIGNS: Temp:  [97.2 F (36.2 C)-97.8 F (36.6 C)] 97.5 F (36.4 C) (11/19 0800) Pulse Rate:  [32-115] 86  (11/19 0900) Resp:  [10-36] 30  (11/19 0900) BP: (50-129)/(26-71) 129/69 mmHg (11/19 0808) SpO2:  [90 %-100 %] 99 % (11/19 0900) Arterial Line BP: (48-158)/(37-82) 125/68 mmHg (11/19 0900) FiO2 (%):  [50 %-60 %] 60 % (11/19 0808) Weight:  [94.9 kg (209 lb 3.5 oz)] 94.9 kg (209 lb 3.5 oz) (11/19 0600) HEMODYNAMICS: CVP:  [10 mmHg-24 mmHg] 14 mmHg VENTILATOR SETTINGS: Vent Mode:  [-] PRVC FiO2 (%):  [50 %-60 %] 60 % Set Rate:  [30 bmp] 30 bmp Vt Set:  [470 mL] 470 mL PEEP:  [8 cmH20] 8 cmH20 Plateau Pressure:  [18 cmH20-32 cmH20] 26 cmH20  INTAKE /  OUTPUT: Intake/Output      11/18 0701 - 11/19 0700 11/19 0701 - 11/20 0700   I.V. (mL/kg) 974.8 (10.3) 45.2 (0.5)   NG/GT 150 20   IV Piggyback 910    Total Intake(mL/kg) 2034.8 (21.4) 65.2 (0.7)   Urine (mL/kg/hr)  0   Emesis/NG output 300    Other 3122    Total Output 3422 0   Net -1387.2 +65.2          PHYSICAL EXAMINATION: General:  Chronically ill-appearing 66 year old male appears older than stated age.  Neuro:  Sedated and intubated.  Follows commands when sedation held HEENT:  Normal systolic, no JVD. Cardiovascular:  No murmur rub or gallop. Regular rhythm rapid rate Lungs: Coarse BS diffusely. Abdomen:  Distended, firm, generalized discomfort to palpation. Has shifting dullness to percussion. Positive bowel sounds. Musculoskeletal:  Generalized weakness but intact Skin:  Lower extremities are both swollen and edematous. Erythemic, warm to touch, and patient reports significant discomfort to gentle palpation bilaterally   LABS:  Lab 08/17/12 0411 08/17/12 0410 08/16/12 2354 08/16/12 1600 08/16/12 1143 08/16/12 0930 08/16/12 0620 08/16/12 0500 08/16/12 0340 08/15/12 1146 08/15/12 0355 08/14/12 0512 08/13/12 1200 08/13/12 1130 08/13/12 0454 08/11/12 0425  HGB 11.3* -- -- -- -- -- -- 11.8* -- -- 11.5* -- -- -- -- --  WBC 29.7* -- -- -- -- -- -- 30.5* -- -- 17.2* -- -- -- -- --  PLT 112* -- -- -- -- -- -- 232 -- -- 220 -- -- -- -- --  NA -- 137 -- 137 -- -- -- 138 -- -- -- -- -- -- -- --  K -- 4.7 -- 4.5 -- -- -- -- -- -- -- -- -- -- -- --  CL -- 99 -- 100 -- -- -- 87* -- -- -- -- -- -- -- --  CO2 -- 21 -- 21 -- -- -- >45* -- -- -- -- -- -- -- --  GLUCOSE -- 177* -- 142* -- -- 181* -- -- -- -- -- -- -- -- --  BUN -- 54* -- 54* -- -- -- 47* -- -- -- -- -- -- -- --  CREATININE -- 2.10* -- 2.23* -- -- -- 1.72* -- -- -- -- -- -- -- --  CALCIUM -- 7.5* -- 7.8* -- -- -- 6.0* -- -- -- -- -- -- -- --  MG -- 2.5 -- -- -- -- -- 2.0 -- -- 2.5 -- -- -- -- --  PHOS -- 5.9* -- 5.6* --  -- -- 4.0 -- -- -- -- -- -- -- --  AST -- -- -- -- -- 87* -- -- -- -- -- 109* -- -- 70* --  ALT -- -- -- -- -- 34 -- -- -- -- -- 42 -- -- 34 --  ALKPHOS -- -- -- -- -- 207* -- -- -- -- -- 187* -- -- 173* --  BILITOT -- -- -- -- -- 1.8* -- -- -- -- -- 1.7* -- -- 2.2* --  PROT -- -- -- -- -- 5.9* -- -- -- -- -- 5.5* -- -- 6.0 --  ALBUMIN -- 3.5 -- 1.4* -- 1.3* -- -- -- -- -- -- -- -- -- --  APTT -- -- -- -- -- -- -- -- -- 34 -- -- -- -- -- --  INR -- -- -- -- -- -- -- -- -- 1.35 -- -- 1.72* -- -- 1.65*  LATICACIDVEN -- -- -- -- -- -- -- -- -- -- --  2.9* -- -- -- --  TROPONINI -- -- -- -- -- -- -- -- -- -- -- -- -- -- -- --  PROCALCITON -- -- -- -- -- -- -- -- -- -- 5.71 4.36 -- 3.86 -- --  PROBNP -- -- -- -- -- -- -- -- -- -- -- -- -- -- -- --  O2SATVEN -- -- -- -- -- -- -- -- -- -- -- -- -- -- -- --  PHART -- -- 7.352 -- 7.457* -- -- -- 7.391 -- -- -- -- -- -- --  PCO2ART -- -- 40.6 -- 35.7 -- -- -- 34.9* -- -- -- -- -- -- --  PO2ART -- -- 63.0* -- 61.0* -- -- -- 62.8* -- -- -- -- -- -- --    Lab 08/17/12 0746 08/17/12 0412 08/16/12 2354 08/16/12 1934 08/16/12 1556  GLUCAP 157* 153* 148* 125* 149*   Lipase     Component Value Date/Time   LIPASE 2399* 08/17/2012 0410    Intake/Output Summary (Last 24 hours) at 08/17/12 0904 Last data filed at 08/17/12 0807  Gross per 24 hour  Intake 1848.61 ml  Output   3001 ml  Net -1152.39 ml   IMAGING: PCXR: 11/18>>>Progressive bibasilar volume loss right greater than left consistent with worsening atelectasis  ECG: ST  DIAGNOSES: Principal Problem:  *Pancreatitis, alcoholic, acute Active Problems:  HTN (hypertension)  Alcohol abuse  Tobacco abuse  HCAP (healthcare-associated pneumonia)  ARF (acute renal failure)  Hyperkalemia  Hyponatremia  Hernia, inguinal  Cholelithiases  Leukocytosis  Pancreatic pseudocyst/cyst  Lower extremity pain, bilateral  Candidal dermatitis  Dehydration  Acute respiratory failure with hypoxia   Alcoholic liver damage  Nonspecific elevation of levels of transaminase or lactic acid dehydrogenase (LDH)  ASSESSMENT / PLAN:  PULMONARY  ASSESSMENT: Bibasilar right greater than left atelectasis Currently gas exchange and oxygenation are adequate. He has progression of bibasilar volume loss right greater than left. This could reflect healthcare associated pneumonia versus aspiration pneumonia,but would favor progressive atelectasis. He has a decent cough when requested, however not able to expectorate secretions. Certainly at risk for inability to protect airway.  Massive right sided pleural effusion.  PLAN:   ARDS protocol - currently on 10 of PEEP and 50% ABG in AM CXR in AM Stress dose steriods. Plan for thora today After thora will titrate O2 down as tolerated.  CARDIOVASCULAR  ASSESSMENT:  Systemic inflammatory response syndrome/Sirs and sepsis: in the setting of pancreatitis with evolving pseudocyst. potential sources: being infected pseudocyst, lower extremity cellulitis. Also possible aspiration versus hospital associated pneumonia. Required addition of Vaso over night Lower extremity edema: Bilateral likely reflects cellulitis, however need to rule out deep vein thrombosis.  PLAN:  Continue Norepi and vaso CVVHD removal decreased CVPs Q4H. Thora today  RENAL  ASSESSMENT:   Acute renal failure Probably multi-factorial but hepato-renal syndrome seems to be most likely.  Fluid removal rate was decreased 11-19 r/t hypotension. Metabolic acidosis: improved with CVVHD  PLAN:   CVVHD per renal CVP Q4h KVO IVF  Renal dose medications. Renal following  GASTROINTESTINAL  ASSESSMENT:   Acute pancreatitis with pseudocyst: still in marked abdominal discomfort, progressive ascites, worsening abdominal distention. Lipase rising  Incarcerated left inguinal hernia: surgery planned for future, but currently elective Cholelithiasis: no evidence of cholecystitis Abnormal  LFTs in the setting of alcoholic hepatitis Protein calorie malnutrition  PLAN:   GI and surgical services are following Follow LFTs Hold TF GI PX ordered IR today for perc drain placement will send  fluid for amylase and lipase Possibly paracentesis after drain placement  HEMATOLOGIC  ASSESSMENT: Hemodilution related anemia: no current evidence of blood loss Coagulopathy: Mild, probably related to alcohol hepatitis PLAN:  Monitor coags. Heparin sub q for dvt px Transfuse for hemoglobin less than 7 and last he developed shock, then goal directed therapy protocol will drive transfusion trigger.  INFECTIOUS  ASSESSMENT:   Sirs/sepsis: Multiple potential sources as follows: Infected pseudocyst, HCAP Versus aspiration pneumonia and lower extremity cellulitis. Possible candida dermatitis both lower extremities.  PLAN:   Follow pending cultures. ABX as above Renal dose abx Continue Nizoral cream to lower extremities.   ENDOCRINE  ASSESSMENT:   Hyperglycemia  PLAN:   Cont CBG SSI as needed  NEUROLOGIC  ASSESSMENT:   Acute encephalopathy: Likely in the setting of sepsis. Additionally concerned about alcohol withdrawal PLAN:   PRN sedation only. Follow ammonia  CLINICAL SUMMARY:  66 year old male patient admitted with abdominal pain and progressive weakness pancreatitis, left inguinal hernia (currently reduced), dehydration and renal failure. Initially admitted to the medical ward with supportive therapy including IV fluids, GI and surgical consultations, and empiric antibiotics. He's had progressive weakness, decreased level of consciousness, and now his ability to adequately protect airway is in question.  Patient was intubated but on 11/17 patient had worsening renal function, renal was called and CVVHD was started.  Currently on Vanc, primaxin and micafungin. Plan for thora today and perc drain placement in IR. May perform diagnostic paracentesis if able to tolerate.  I  have personally obtained a history, examined the patient, evaluated laboratory and imaging results, formulated the assessment and plan and placed orders.  CRITICAL CARE: The patient is critically ill with multiple organ systems failure and requires high complexity decision making for assessment and support, frequent evaluation and titration of therapies, application of advanced monitoring technologies and extensive interpretation of multiple databases. Critical Care Time devoted to patient care services described in this note is 35 minutes.   Alyson Reedy, M.D. [facility] Pulmonary/Critical Care Medicine. Pager: [phone]. After hours pager: [phone].

## 2012-08-17 NOTE — Progress Notes (Signed)
Patient ID: Gary [name], male   DOB: 22-Oct-1945, 65 y.o.   MRN: [id number]    Subjective: Pt still intubated.  Objective: Vital signs in last 24 hours: Temp:  [97.2 F (36.2 C)-97.8 F (36.6 C)] 97.5 F (36.4 C) (11/19 0800) Pulse Rate:  [32-115] 87  (11/19 0808) Resp:  [10-36] 30  (11/19 0808) BP: (50-129)/(26-71) 129/69 mmHg (11/19 0808) SpO2:  [90 %-100 %] 98 % (11/19 0808) Arterial Line BP: (48-158)/(37-82) 132/69 mmHg (11/19 0800) FiO2 (%):  [50 %-60 %] 60 % (11/19 0808) Weight:  [209 lb 3.5 oz (94.9 kg)] 209 lb 3.5 oz (94.9 kg) (11/19 0600) Last BM Date: 08/15/12  Intake/Output from previous day: 11/18 0701 - 11/19 0700 In: 2034.8 [I.V.:974.8; NG/GT:150; IV Piggyback:910] Out: 3422 [Emesis/NG output:300] Intake/Output this shift: Total I/O In: 65.2 [I.V.:45.2; NG/GT:20] Out: 0   PE: Abd: tight, distended, -BS, winces to palpation, diffuse anasarca  Lab Results:   Basename 08/17/12 0411 08/16/12 0500  WBC 29.7* 30.5*  HGB 11.3* 11.8*  HCT 32.6* 34.1*  PLT 112* 232   BMET  Basename 08/17/12 0410 08/16/12 1600  NA 137 137  K 4.7 4.5  CL 99 100  CO2 21 21  GLUCOSE 177* 142*  BUN 54* 54*  CREATININE 2.10* 2.23*  CALCIUM 7.5* 7.8*   PT/INR  Basename 08/15/12 1146  LABPROT 16.4*  INR 1.35   CMP     Component Value Date/Time   NA 137 08/17/2012 0410   K 4.7 08/17/2012 0410   CL 99 08/17/2012 0410   CO2 21 08/17/2012 0410   GLUCOSE 177* 08/17/2012 0410   BUN 54* 08/17/2012 0410   CREATININE 2.10* 08/17/2012 0410   CALCIUM 7.5* 08/17/2012 0410   PROT 5.9* 08/16/2012 0930   ALBUMIN 3.5 08/17/2012 0410   AST 87* 08/16/2012 0930   ALT 34 08/16/2012 0930   ALKPHOS 207* 08/16/2012 0930   BILITOT 1.8* 08/16/2012 0930   GFRNONAA 31* 08/17/2012 0410   GFRAA 36* 08/17/2012 0410   Lipase     Component Value Date/Time   LIPASE 2399* 08/17/2012 0410       Studies/Results: Dg Chest Port 1 View  08/16/2012  *RADIOLOGY REPORT*  Clinical Data:  Intubated patient. Pancreatitis.  PORTABLE CHEST - 1 VIEW  Comparison: Single view of the chest 08/15/2012.  Findings: The patient is rotated on the study.  Support tubes and lines are unchanged.  Large right pleural effusion and extensive airspace disease persist.  Small left effusion and basilar airspace disease are unchanged.  No pneumothorax is identified.  IMPRESSION: No change in right much worse than left pleural effusions and airspace disease.   Original Report Authenticated By: Holley Dexter, M.D.    Dg Chest Port 1 View  08/15/2012  *RADIOLOGY REPORT*  Clinical Data: Post dialysis catheter placement.  PORTABLE CHEST - 1 VIEW  Comparison: Portable exam 1700 hours compared to the 0705 hours  Findings: New large bore left jugular central venous catheter tip projecting over SVC. Endotracheal and nasogastric tubes unchanged. Stable heart size with slight vascular congestion. Persistent large right pleural effusion with slight mediastinal shift to the left. Persistent left lower lobe atelectasis versus consolidation. No pneumothorax.  IMPRESSION: No pneumothorax following central line placement. Otherwise no change.   Original Report Authenticated By: Ulyses Southward, M.D.     Anti-infectives: Anti-infectives     Start     Dose/Rate Route Frequency Ordered Stop   08/17/12 0900   vancomycin (VANCOCIN) IVPB 1000 mg/200 mL premix  1,000 mg 200 mL/hr over 60 Minutes Intravenous Every 24 hours 08/17/12 0730     08/16/12 1200   micafungin (MYCAMINE) 100 mg in sodium chloride 0.9 % 100 mL IVPB        100 mg 100 mL/hr over 1 Hours Intravenous Daily 08/16/12 1146     08/15/12 1300   imipenem-cilastatin (PRIMAXIN) 250 mg in sodium chloride 0.9 % 100 mL IVPB        250 mg 200 mL/hr over 30 Minutes Intravenous 4 times per day 08/15/12 1119     08/13/12 1400   vancomycin (VANCOCIN) 750 mg in sodium chloride 0.9 % 150 mL IVPB  Status:  Discontinued        750 mg 150 mL/hr over 60 Minutes  Intravenous Every 12 hours 08/13/12 1254 08/15/12 1115   08/13/12 1400   imipenem-cilastatin (PRIMAXIN) 500 mg in sodium chloride 0.9 % 100 mL IVPB  Status:  Discontinued        500 mg 200 mL/hr over 30 Minutes Intravenous 3 times per day 08/13/12 1254 08/15/12 1119   08/12/12 1700   levofloxacin (LEVAQUIN) IVPB 750 mg  Status:  Discontinued        750 mg 100 mL/hr over 90 Minutes Intravenous Every 48 hours 08/15/2012 1749 08/12/12 1130   08/12/12 0600   vancomycin (VANCOCIN) IVPB 1000 mg/200 mL premix  Status:  Discontinued        1,000 mg 200 mL/hr over 60 Minutes Intravenous Every 24 hours 08/11/12 1141 08/12/12 1130   08/24/2012 2200   piperacillin-tazobactam (ZOSYN) IVPB 3.375 g  Status:  Discontinued        3.375 g 12.5 mL/hr over 240 Minutes Intravenous Every 8 hours 08/24/2012 1749 08/12/12 1130   08/09/2012 1800   vancomycin (VANCOCIN) 750 mg in sodium chloride 0.9 % 150 mL IVPB  Status:  Discontinued        750 mg 150 mL/hr over 60 Minutes Intravenous Every 12 hours 08/05/2012 1749 08/11/12 1141   08/28/2012 1530   vancomycin (VANCOCIN) IVPB 1000 mg/200 mL premix  Status:  Discontinued        1,000 mg 200 mL/hr over 60 Minutes Intravenous  Once 08/11/2012 1528 08/11/2012 1808   08/25/2012 1530  piperacillin-tazobactam (ZOSYN) IVPB 3.375 g       3.375 g 100 mL/hr over 30 Minutes Intravenous  Once 08/09/2012 1528 08/07/2012 1630   08/12/2012 1530   levofloxacin (LEVAQUIN) IVPB 750 mg  Status:  Discontinued        750 mg 100 mL/hr over 90 Minutes Intravenous  Once 08/27/2012 1528 08/01/2012 1808           Assessment/Plan  1. Pancreatitis 2. Multi systems organ failure 3. Pleural effusion 4. Intra-abdominal fluid collection, peripancreatic  Plan: 1. Continue current care, no acute surgical indications 2.  IR to see patient today to consider perc drain of abdominal fluid collection, hopefully this will help relieve some of the pressure inside his abdomen. 3. CCM to do thoracentesis today of  large pleural effusion 4. Will follow  LOS: 7 days    Devinn Voshell E 08/17/2012, 8:53 AM Pager: [phone]

## 2012-08-17 NOTE — ED Notes (Signed)
Transport back to ICU with RN and resp therapy.  Tolerated well.

## 2012-08-17 NOTE — Progress Notes (Signed)
Patient ID: Gary [name], male   DOB: [birth year], 66 y.o.   MRN: [id number]   [facility] Progress Note    Subjective:   Overnight events noted with need for decreased ultrafiltration rate given hypotension. CR RT discontinued this morning do to catheter malposition-awaiting re-suturing. Increasing pressor needs overnight, now back on vasopressin    Objective:   BP 105/71  Pulse 88  Temp 97.5 F (36.4 C) (Oral)  Resp 30  Ht 5\' 4"  (1.626 m)  Wt 94.9 kg (209 lb 3.5 oz)  BMI 35.91 kg/m2  SpO2 99%  Intake/Output Summary (Last 24 hours) at 08/17/12 0812 Last data filed at 08/17/12 8295  Gross per 24 hour  Intake 1893.11 ml  Output   3306 ml  Net -1412.89 ml   Weight change: -1.3 kg (-2 lb 13.9 oz)  Physical Exam: Gen: Intubated, sedated. CVS: Pulse regular in rate and rhythm, heart sounds S1 and S2 normal Resp: Mechanical/coarse breath sounds bilaterally-no rales/rhonchi Abd: Soft, obese, tender over the epigastric area Ext: One plus edema bipedally  Imaging: Dg Chest Port 1 View  08/16/2012  *RADIOLOGY REPORT*  Clinical Data: Intubated patient. Pancreatitis.  PORTABLE CHEST - 1 VIEW  Comparison: Single view of the chest 08/15/2012.  Findings: The patient is rotated on the study.  Support tubes and lines are unchanged.  Large right pleural effusion and extensive airspace disease persist.  Small left effusion and basilar airspace disease are unchanged.  No pneumothorax is identified.  IMPRESSION: No change in right much worse than left pleural effusions and airspace disease.   Original Report Authenticated By: Holley Dexter, M.D.    Dg Chest Port 1 View  08/15/2012  *RADIOLOGY REPORT*  Clinical Data: Post dialysis catheter placement.  PORTABLE CHEST - 1 VIEW  Comparison: Portable exam 1700 hours compared to the 0705 hours  Findings: New large bore left jugular central venous catheter tip projecting over SVC. Endotracheal and nasogastric tubes unchanged. Stable  heart size with slight vascular congestion. Persistent large right pleural effusion with slight mediastinal shift to the left. Persistent left lower lobe atelectasis versus consolidation. No pneumothorax.  IMPRESSION: No pneumothorax following central line placement. Otherwise no change.   Original Report Authenticated By: Ulyses Southward, M.D.     Labs: BMET  Lab 08/17/12 0410 08/16/12 1600 08/16/12 0620 08/16/12 0500 08/15/12 1854 08/15/12 1146 08/15/12 0355 08/14/12 2007 08/12/12 0510  NA 137 137 -- 138 140 138 136 140 --  K 4.7 4.5 -- 3.3* 4.0 4.3 4.5 3.9 --  CL 99 100 -- 87* 102 102 103 106 --  CO2 21 21 -- >45* 21 23 20 20  --  GLUCOSE 177* 142* 181* 784* 220* 223* 219* -- --  BUN 54* 54* -- 47* 70* 68* 65* 61* --  CREATININE 2.10* 2.23* -- 1.72* 2.55* 2.63* 2.35* 2.13* --  ALB -- -- -- -- -- -- -- -- --  CALCIUM 7.5* 7.8* -- 6.0* 7.0* 7.6* 7.9* 7.8* --  PHOS 5.9* 5.6* -- 4.0 5.7* -- 4.7* -- 4.5   CBC  Lab 08/17/12 0411 08/16/12 0500 08/15/12 0355 08/14/12 0512 07-Sep-2012 1319  WBC 29.7* 30.5* 17.2* 13.3* --  NEUTROABS 27.6* -- -- -- 22.6*  HGB 11.3* 11.8* 11.5* 11.8* --  HCT 32.6* 34.1* 32.9* 33.9* --  MCV 101.6* 102.4* 102.8* 103.4* --  PLT 112* 232 220 252 --    Medications:      . albuterol  6 puff Inhalation Q6H  . antiseptic oral rinse  15  mL Mouth Rinse QID  . chlorhexidine  15 mL Mouth/Throat BID  . heparin subcutaneous  5,000 Units Subcutaneous Q8H  . hydrocortisone sod succinate (SOLU-CORTEF) injection  50 mg Intravenous Q6H  . imipenem-cilastatin  250 mg Intravenous Q6H  . insulin aspart  0-9 Units Subcutaneous Q4H  . ketoconazole  1 application Topical BID  . micafungin [facility]) IV  100 mg Intravenous Daily  . pantoprazole (PROTONIX) IV  40 mg Intravenous Q24H  . sodium chloride  10-40 mL Intracatheter Q12H  . sodium chloride  3 mL Intravenous Q12H  . vancomycin  1,000 mg Intravenous Q24H  . [DISCONTINUED] albumin human  50 g Intravenous Q6H  . [DISCONTINUED]  heparin subcutaneous  5,000 Units Subcutaneous Q8H  . [DISCONTINUED] ketoconazole   Topical BID     Assessment/ Plan:   1. ARF, oliguric- most consistent with ischemic ATN, on the ddx would be AIN, abdominal compartment syndrome and HRS. Currently off CRRT and will be restarted after dialysis catheter is secured in place-given hypotension and events over the last 24 hours we'll decrease net fluid removal rate. Remains on pressors.  2. VDRF- per PCCM, will continue gentle UF for pulmonary unloading and facilitating wean  3. SIRS secondary to pancreatitis- on low dose pressors and CRRT support as above, on multiple pressors. Off BSABx. Plans noted for possible percutaneous drainage of pancreatic pseudocyst by interventional radiology today. 4. Acute on chronic pancreatitis- cont with supportive care  5. Ascites with H/O cirrhosis- would refrain from large volume paracentesis given hemodynamic instability.    Zetta Bills, MD 08/17/2012, 8:12 AM

## 2012-08-17 NOTE — Progress Notes (Signed)
Vent changes made per MD order.  RT will continue to monitor.  

## 2012-08-17 NOTE — Procedures (Signed)
Thoracentesis Procedure Note  Pre-operative Diagnosis: Pleural effusion on the right  Post-operative Diagnosis: same  Indications: Diagnostic and therapeutic  Procedure Details  Consent: Informed consent was obtained from wife. Risks of the procedure were discussed including: infection, bleeding, pain, pneumothorax.  Under sterile conditions the patient was positioned. Betadine solution and sterile drapes were utilized.  1% buffered lidocaine was used to anesthetize the 7th lateral rib space. Fluid was obtained without any difficulties and minimal blood loss.  A dressing was applied to the wound and wound care instructions were provided.   Findings 1600 ml of clear brown pleural fluid was obtained. A sample was sent to Pathology for cytogenetics, flow, and cell counts, as well as for infection analysis.  Complications:  None; patient tolerated the procedure well.          Condition: stable  Plan A follow up chest x-ray was ordered.  PRIBULA,CHRISTOPHER Internal Medicine Resident PGY III Pager: [phone] 08/17/2012 12:12 PM   Attending Attestation: I was present for the entire procedure.  U/S used in drainage.  Patient seen and examined, agree with above note.  I dictated the care and orders written for this patient under my direction.  Koren Bound, M.D. [phone]

## 2012-08-17 NOTE — ED Notes (Signed)
Pt ventilated, unresponsive from 2914.  Report received from ICU nurse.  On fentanyl and versed drips for sedation.  Moved on to CT table.

## 2012-08-17 NOTE — Plan of Care (Signed)
Problem: Phase I Progression Outcomes Goal: ARDS Protocol initiated if indicated Outcome: Progressing patient placed on ARDS protocol Goal: Hemodynamically stable Outcome: Not Progressing Currently increasing infusion rate of levophed to maintain MAP > 65.

## 2012-08-17 NOTE — Progress Notes (Signed)
Nursing: Patient noted to be struggling with the ventilator. Dr. Marin Shutter called to bedside. Ventilator adjustments made . Discussed with Dr. Marin Shutter patient's current condition, assessment and medications infusing. Verbal order received to resume vasopressin infusion. Also additional orders given to start patient on continues sedation orders. Patient's daughter Reita Cliche was at bedside during discussion and her questions were answered.

## 2012-08-17 NOTE — Procedures (Signed)
Technically successful CT guided perihepatic drainage catheter placement.  No immediate post procedural complications.

## 2012-08-18 ENCOUNTER — Inpatient Hospital Stay (HOSPITAL_COMMUNITY): Payer: Medicare Other

## 2012-08-18 DIAGNOSIS — F17200 Nicotine dependence, unspecified, uncomplicated: Secondary | ICD-10-CM

## 2012-08-18 LAB — BLOOD GAS, ARTERIAL
Acid-base deficit: 3.7 mmol/L — ABNORMAL HIGH (ref 0.0–2.0)
Drawn by: 244901
FIO2: 0.5 %
MECHVT: 470 mL
O2 Saturation: 95.8 %
PEEP: 10 cmH2O
RATE: 30 resp/min
pCO2 arterial: 35.8 mmHg (ref 35.0–45.0)

## 2012-08-18 LAB — POCT I-STAT 3, ART BLOOD GAS (G3+)
Acid-base deficit: 3 mmol/L — ABNORMAL HIGH (ref 0.0–2.0)
O2 Saturation: 97 %
Patient temperature: 98.6
TCO2: 22 mmol/L (ref 0–100)
pCO2 arterial: 35.4 mmHg (ref 35.0–45.0)

## 2012-08-18 LAB — RENAL FUNCTION PANEL
BUN: 52 mg/dL — ABNORMAL HIGH (ref 6–23)
Creatinine, Ser: 2 mg/dL — ABNORMAL HIGH (ref 0.50–1.35)
Glucose, Bld: 164 mg/dL — ABNORMAL HIGH (ref 70–99)
Phosphorus: 6 mg/dL — ABNORMAL HIGH (ref 2.3–4.6)
Potassium: 5.3 mEq/L — ABNORMAL HIGH (ref 3.5–5.1)

## 2012-08-18 LAB — BASIC METABOLIC PANEL
Calcium: 6.7 mg/dL — ABNORMAL LOW (ref 8.4–10.5)
GFR calc Af Amer: 40 mL/min — ABNORMAL LOW (ref >90)
GFR calc non Af Amer: 35 mL/min — ABNORMAL LOW (ref >90)
Sodium: 136 mEq/L (ref 135–145)

## 2012-08-18 LAB — AMYLASE, BODY FLUID: Amylase, Fluid: 4182 U/L

## 2012-08-18 LAB — CBC
HCT: 34.7 % — ABNORMAL LOW (ref 39.0–52.0)
Hemoglobin: 11.9 g/dL — ABNORMAL LOW (ref 13.0–17.0)
MCH: 34.4 pg — ABNORMAL HIGH (ref 26.0–34.0)
MCHC: 34.3 g/dL (ref 30.0–36.0)
RDW: 16.3 % — ABNORMAL HIGH (ref 11.5–15.5)

## 2012-08-18 LAB — GLUCOSE, CAPILLARY
Glucose-Capillary: 148 mg/dL — ABNORMAL HIGH (ref 70–99)
Glucose-Capillary: 133 mg/dL — ABNORMAL HIGH (ref 70–99)
Glucose-Capillary: 106 mg/dL — ABNORMAL HIGH (ref 70–99)
Glucose-Capillary: 129 mg/dL — ABNORMAL HIGH (ref 70–99)

## 2012-08-18 MED ORDER — SODIUM CHLORIDE 0.9 % IV SOLN
500.0000 mg | Freq: Four times a day (QID) | INTRAVENOUS | Status: DC
  Administered 2012-08-18 – 2012-08-22 (×15): 500 mg via INTRAVENOUS
  Filled 2012-08-18 (×18): qty 500

## 2012-08-18 NOTE — Clinical Social Work Note (Signed)
Clinical Social Worker will continue to follow for discharge planning needs.   Kraig Genis MSW, LCSWA [phone]  

## 2012-08-18 NOTE — Progress Notes (Signed)
Bite block placed.  Dr. Yacoub aware. 

## 2012-08-18 NOTE — Progress Notes (Signed)
Nutrition Follow-up  Intervention:   1. Recommend initiation of Vital 1.5 @ 25 ml/hr, this is the goal rate. 30 ml Pro-stat 5 times daily. This EN regimen will provide 1400 kcal, 115 gm protein, and 458 ml free water.   2. RD will continue to follow    Assessment:   Pt was intubated on 11/17 for concern for ability to protect airway. Was receiving Vital AF 1.2 previously, was d/c'd on 11/18. Remains off at this time.  Pt now on ARDS protocol and CVVH for ultrafiltration. 100 ml per hour goal fluid removal.  S/p thoracentesis and paracentesis yesterday.  RN reports that nutrition has not been addressed yet.  Recommend using elemental nutrition formula for best GI tolerance with pancreatitis.   Diet Order:  NPO  Meds: Scheduled Meds:   . albuterol  6 puff Inhalation Q6H  . antiseptic oral rinse  15 mL Mouth Rinse QID  . chlorhexidine  15 mL Mouth/Throat BID  . heparin subcutaneous  5,000 Units Subcutaneous Q8H  . hydrocortisone sod succinate (SOLU-CORTEF) injection  50 mg Intravenous Q6H  . imipenem-cilastatin  250 mg Intravenous Q6H  . insulin aspart  0-9 Units Subcutaneous Q4H  . ketoconazole  1 application Topical BID  . micafungin [facility]) IV  100 mg Intravenous Daily  . pantoprazole (PROTONIX) IV  40 mg Intravenous Q24H  . sodium chloride  10-40 mL Intracatheter Q12H  . sodium chloride  3 mL Intravenous Q12H  . sodium chloride irrigation  5 mL Irrigation Q8H  . vancomycin  1,000 mg Intravenous Q24H   Continuous Infusions:   . sodium chloride Stopped (08/14/12 1700)  . sodium chloride 10 mL/hr at 08/15/12 1000  . fentaNYL infusion INTRAVENOUS 25 mcg/hr (08/18/12 0959)  . midazolam (VERSED) infusion 1 mg/hr (08/18/12 0959)  . norepinephrine (LEVOPHED) Adult infusion 10 mcg/min (08/18/12 0900)  . dialysis replacement fluid (prismasate) 300 mL/hr at 08/18/12 0130  . dialysis replacement fluid (prismasate) 200 mL/hr at 08/17/12 2225  . dialysate (PRISMASATE) 1,000 mL/hr at  08/18/12 0850  . vasopressin (PITRESSIN) infusion - *FOR SHOCK* 0.03 Units/min (08/17/12 2100)   PRN Meds:.sodium chloride, albuterol, DOBUTamine, fentaNYL, heparin, heparin, LORazepam, midazolam, norepinephrine (LEVOPHED) Adult infusion, ondansetron (ZOFRAN) IV, ondansetron, sodium chloride   CMP     Component Value Date/Time   NA 137 08/18/2012 0507   K 5.3* 08/18/2012 0507   CL 101 08/18/2012 0507   CO2 21 08/18/2012 0507   GLUCOSE 164* 08/18/2012 0507   BUN 52* 08/18/2012 0507   CREATININE 2.00* 08/18/2012 0507   CALCIUM 6.8* 08/18/2012 0507   PROT 5.9* 08/16/2012 0930   ALBUMIN 2.4* 08/18/2012 0507   AST 87* 08/16/2012 0930   ALT 34 08/16/2012 0930   ALKPHOS 207* 08/16/2012 0930   BILITOT 1.8* 08/16/2012 0930   GFRNONAA 33* 08/18/2012 0507   GFRAA 38* 08/18/2012 0507   Sodium  Date/Time Value Range Status  08/18/2012  5:07 AM 137  135 - 145 mEq/L Final  08/17/2012  4:00 PM 135  135 - 145 mEq/L Final  08/17/2012  4:10 AM 137  135 - 145 mEq/L Final    Potassium  Date/Time Value Range Status  08/18/2012  5:07 AM 5.3* 3.5 - 5.1 mEq/L Final  08/17/2012  4:00 PM 4.6  3.5 - 5.1 mEq/L Final  08/17/2012  4:10 AM 4.7  3.5 - 5.1 mEq/L Final    Phosphorus  Date/Time Value Range Status  08/18/2012  5:07 AM 6.0* 2.3 - 4.6 mg/dL Final  16/06/9603  5:40  PM 6.4* 2.3 - 4.6 mg/dL Final  78/29/5621  3:08 AM 5.9* 2.3 - 4.6 mg/dL Final    Magnesium  Date/Time Value Range Status  08/18/2012  5:07 AM 2.5  1.5 - 2.5 mg/dL Final  65/78/4696  2:95 AM 2.5  1.5 - 2.5 mg/dL Final  28/41/3244  0:10 AM 2.0  1.5 - 2.5 mg/dL Final     CBG (last 3)   Basename 08/18/12 0738 08/18/12 0355 08/17/12 2347  GLUCAP 133* 148* 143*     Intake/Output Summary (Last 24 hours) at 08/18/12 1058 Last data filed at 08/18/12 1000  Gross per 24 hour  Intake 1564.16 ml  Output   6188 ml  Net -4623.84 ml   Patient is currently intubated on ventilator support.  MV: 13 Temp:Temp (24hrs), Avg:97.7  F (36.5 C), Min:97.3 F (36.3 C), Max:98.3 F (36.8 C)  Propofol: none    Weight Status:  195 lbs, trending back down, but still above admission weight of 165 lbs.   Re-estimated needs:  1996 kcal (1198-1397 kcal underfeeding goal), 118-130 gm protein   Nutrition Dx:  Inadequate Oral Intake r/t acute on chronic pancreatitis as evidenced by NPO status, ongoing  Goal:  EN to meet >/= 90% of estimated nutrition needs, currently unmet  Monitor:  Vent status, weight, labs, initiation of EN, I/O's   Clarene Duke RD, LDN Pager [phone] After Hours pager [phone]

## 2012-08-18 NOTE — Progress Notes (Signed)
Subjective: Remains sedated on vent. Family presents.   Objective: Vital signs in last 24 hours: Temp:  [97.3 F (36.3 C)-98.3 F (36.8 C)] 97.4 F (36.3 C) (11/20 0800) Pulse Rate:  [71-94] 89  (11/20 1100) Resp:  [24-35] 28  (11/20 1100) BP: (104-175)/(41-79) 136/70 mmHg (11/20 0910) SpO2:  [98 %-100 %] 100 % (11/20 1100) Arterial Line BP: (95-147)/(48-75) 119/66 mmHg (11/20 1100) FiO2 (%):  [50 %] 50 % (11/20 1200) Weight:  [195 lb 5.2 oz (88.6 kg)] 195 lb 5.2 oz (88.6 kg) (11/20 0448) Last BM Date: 08/15/12  Intake/Output from previous day: 11/19 0701 - 11/20 0700 In: 1737.9 [I.V.:857.9; NG/GT:170; IV Piggyback:710] Out: 5786 [Urine:20; Emesis/NG output:450; Drains:1970] Intake/Output this shift: Total I/O In: 377.2 [I.V.:77.2; IV Piggyback:300] Out: 1002 [Other:1002]  PE: sedated on vent. Peri-hepatic drain intact with mostly serous appearing drainage.  Has had 350 ml output total since placement of drain including initial 100 ml sent to lab.  RN sending fluid to lab for additional studies not ordered on yesterday.  Abdomen soft, few bowel sounds.  No evidence of leakage when drain flushed with 5 ml NS.   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 The patient was placed supine on the CT gantry and a pre procedural CT was performed re-demonstrating the known abscess/fluid collection adjacent to the lateral aspect the right lobe the liver. The procedure was planned.   A timeout was performed prior to the initiation of the procedure  The right lateral abdomen was prepped and draped in the usual sterile fashion.   The overlying soft tissues were anesthetized with 1% lidocaine with epinephrine.  Appropriate trajectory was planned with the use of a 22 gauge spinal needle.  An 18 gauge trocar needle was advanced into the abscess/fluid collection and a short Amplatz super stiff wire was coiled within the collection. Appropriate positioning was confirmed with a limited CT scan.  The tract was serially dilated allowing placement of a 10 [place] all- purpose drainage catheter.  Appropriate positioning was confirmed with a limited postprocedural CT scan.  100 ml of serous, slightly blood tinged fluid was aspirated.  The tube was connected to a drainage bag and sutured in place.  A dressing was placed.  The patient tolerated the procedure well without immediate post procedural complication.  Impression:  Successful CT guided placement of a 10 [place] all purpose drain catheter into the peri hepatic fluid collection with aspiration of 100 mL of serious, slightly blood tinged fluid.  Samples were sent to the laboratory as requested by the ordering clinical team.   Original Report Authenticated By: Simonne Come  Seth Bake, MD    Dg Chest Port 1 View  08/18/2012  *RADIOLOGY REPORT*  Clinical Data: Assess endotracheal tube.  PORTABLE CHEST - 1 VIEW  Comparison: Chest radiograph 08/17/2012.  Findings: Endotracheal tube is in satisfactory position. Nasogastric tube is followed into the stomach with the tip projecting beyond the inferior boundary of the film.  Right PICC and left  IJ central line tips project over the SVC.  Heart size normal.  Large loculated right pleural effusion and right lung air space disease persist.  Increasing left lower lobe air space disease.  IMPRESSION:  1.  Large loculated right pleural effusion and right lung air space disease persist. 2.  Increasing left lower lobe air space disease.   Original Report Authenticated By: Leanna Battles, M.D.    Dg Chest Port 1 View  08/17/2012  *RADIOLOGY REPORT*  Clinical Data: Post thoracentesis  PORTABLE CHEST - 1 VIEW  Comparison: 08/16/2012  Findings: Decrease in right pleural effusion compared with earlier today.  There remains a moderately large right effusion.  No pneumothorax.  There is bibasilar atelectasis.  Endotracheal tube in good position.  Left jugular catheter tip in the SVC.  NG tube enters the stomach.  Right arm PICC tip in the SVC.  IMPRESSION: Decrease in right pleural effusion following thoracentesis.  No pneumothorax.   Original Report Authenticated By: Janeece Riggers, M.D.     Anti-infectives: Anti-infectives     Start     Dose/Rate Route Frequency Ordered Stop   08/17/12 0900   vancomycin (VANCOCIN) IVPB 1000 mg/200 mL premix        1,000 mg 200 mL/hr over 60 Minutes Intravenous Every 24 hours 08/17/12 0730     08/16/12 1200   micafungin (MYCAMINE) 100 mg in sodium chloride 0.9 % 100 mL IVPB        100 mg 100 mL/hr over 1 Hours Intravenous Daily 08/16/12 1146     08/15/12 1300   imipenem-cilastatin (PRIMAXIN) 250 mg in sodium chloride 0.9 % 100 mL IVPB        250 mg 200 mL/hr over 30 Minutes Intravenous 4 times per day 08/15/12 1119     08/13/12 1400   vancomycin (VANCOCIN) 750 mg in sodium chloride 0.9 % 150 mL IVPB  Status:  Discontinued        750 mg 150 mL/hr over 60 Minutes Intravenous Every 12 hours 08/13/12 1254 08/15/12 1115   08/13/12 1400   imipenem-cilastatin (PRIMAXIN) 500 mg in sodium chloride 0.9 % 100 mL IVPB  Status:  Discontinued        500 mg 200 mL/hr over 30  Minutes Intravenous 3 times per day 08/13/12 1254 08/15/12 1119   08/12/12 1700   levofloxacin (LEVAQUIN) IVPB 750 mg  Status:  Discontinued        750 mg 100 mL/hr over 90 Minutes Intravenous Every 48 hours 15-Aug-2012 1749 08/12/12 1130   08/12/12 0600   vancomycin (VANCOCIN) IVPB 1000 mg/200 mL premix  Status:  Discontinued        1,000 mg 200 mL/hr over 60 Minutes Intravenous Every 24 hours 08/11/12 1141 08/12/12 1130   2012/08/15 2200   piperacillin-tazobactam (ZOSYN) IVPB 3.375 g  Status:  Discontinued        3.375 g 12.5 mL/hr over 240 Minutes Intravenous Every 8 hours 2012/08/15 1749 08/12/12 1130   August 15, 2012 1800   vancomycin (VANCOCIN) 750 mg in sodium chloride 0.9 % 150 mL IVPB  Status:  Discontinued  750 mg 150 mL/hr over 60 Minutes Intravenous Every 12 hours 08/01/2012 1749 08/11/12 1141   08/04/2012 1530   vancomycin (VANCOCIN) IVPB 1000 mg/200 mL premix  Status:  Discontinued        1,000 mg 200 mL/hr over 60 Minutes Intravenous  Once 08/28/2012 1528 08/18/2012 1808   08/18/2012 1530   piperacillin-tazobactam (ZOSYN) IVPB 3.375 g        3.375 g 100 mL/hr over 30 Minutes Intravenous  Once 08/02/2012 1528 08/19/2012 1630   07/30/2012 1530   levofloxacin (LEVAQUIN) IVPB 750 mg  Status:  Discontinued        750 mg 100 mL/hr over 90 Minutes Intravenous  Once 08/13/2012 1528 08/15/2012 1808          Assessment/Plan: Perihepatic fluid collection s/p percutaneous drainage 11/19 with good output so far.  Cultures pending.  Drain appears to be functioning well. To continue drain until all labs performed on fluid finalized to determine if pancreas related. IR to continue to follow   LOS: 8 days    Cailan Antonucci D 08/18/2012

## 2012-08-18 NOTE — Progress Notes (Signed)
PULMONARY  / CRITICAL CARE MEDICINE  Name: Gary [name] MRN: [id number] DOB: [birth year]    LOS: 8  REFERRING MD :  [name]  CHIEF COMPLAINT: encephalopathy, respiratory insufficiency  BRIEF PATIENT DESCRIPTION:  66 year old white male admitted 11/12 for abdominal pain in the setting of acute pancreatitis an incarcerated left inguinal hernia. Initially admitted to medicine service. Critical care asked to see on 11/15 for encephalopathy, and concern for progressive respiratory distress and inability to protect airway.  LINES / TUBES: RUE PICC line 11/15>>> L IJ HD Cath 11/17>>> R Rad Aline 11/16>>>  CULTURES: 11/16 Resp >>>CANDIDA ALBICANS 11/13 urine culture: Negative. 11/12 blood culture x2>>>Negative  ANTIBIOTICS: Levofloxacin 11/14>>>11/14>1 dose. Zosyn 11/12>>>11/14 Primaxin 11/15>>> Vancomycin 11/12>>>11/14>>> 11/15>>>  SIGNIFICANT EVENTS:  CT abdomen 11/12: Enlargement of. Pancreatic fluid collection with significant mass effect on both right and left lobe of liver. Suggesting enlarging pseudocysts. Nonspecific nodularity edema of the mesentery with no focal mass. Stable left inguinal hernia. Abdominal ultrasound 11/12: gallstone with echogenic sludge, but no evidence of biliary obstruction or acute cholecystitis. Increased ascites compared to CT 7 days prior fluid around the liver with mass effect. ECHO 11/12: Systolic function was normal. The estimated ejection fraction was in the range of 50% to 55%. There was an increased relative contribution of atrial contraction to ventricular filling.   Bilateral LE ultrasound in 11/15>>>> LEVEL OF CARE:  ICU PRIMARY SERVICE:  [facility] CONSULTANTS:  PCCM, nephro CODE STATUS full code DIET:  N.p.o. DVT Px:  Subcutaneous heparin GI Px: Protonix  HISTORY OF PRESENT ILLNESS:   This is 66 year old male patient with a history of prostate cancer, hypertension, and alcohol abuse. Recently discharged from the hospital for alcohol  withdrawal and abdominal pain secondary to incarcerated left inguinal hernia which was manually reduced by surgery. He was discharged home with planned followup for potential elective hernia repair. Per spouse, patient has had one alcoholic beverage since discharge. Presented to the emergency room on 11/12 with chief complaint of generalized weakness, and increasing fatigue. Also nonproductive cough. Also still complaining of abdominal pain both intermittently upper abdomen, and lower abdomen. Diagnostic evaluation was consistent with pancreatitis CT abdomen demonstrated what appears to be progressive pseudocyst he was admitted by medical services for supportive care. In addition he's had progressive bilateral lower extremity swelling and discomfort with erythema felt to be consistent with candida dermatitis. Critical care services were asked to see on 11/15, as the patient has become progressively lethargic, with poor cough mechanics, worsening abdominal pain, and decreased level of consciousness. Therapeutic interventions to date have included IV antibiotics, both surgical and GI consult services, as well as IV fluid resuscitation. Chest x-ray evaluation on 11/12 demonstrates progressive bibasilar right greater than left volume loss.  VITAL SIGNS: Temp:  [97.3 F (36.3 C)-98.3 F (36.8 C)] 97.4 F (36.3 C) (11/20 0800) Pulse Rate:  [71-94] 89  (11/20 1100) Resp:  [24-35] 28  (11/20 1100) BP: (104-175)/(41-79) 136/70 mmHg (11/20 0910) SpO2:  [98 %-100 %] 100 % (11/20 1100) Arterial Line BP: (95-147)/(48-75) 119/66 mmHg (11/20 1100) FiO2 (%):  [50 %] 50 % (11/20 1200) Weight:  [88.6 kg (195 lb 5.2 oz)] 88.6 kg (195 lb 5.2 oz) (11/20 0448) HEMODYNAMICS: CVP:  [10 mmHg-15 mmHg] 11 mmHg VENTILATOR SETTINGS: Vent Mode:  [-] PRVC FiO2 (%):  [50 %] 50 % Set Rate:  [30 bmp] 30 bmp Vt Set:  [470 mL] 470 mL PEEP:  [8 cmH20-10 cmH20] 8 cmH20 Plateau Pressure:  [16 cmH20-28 cmH20] 22 cmH20 INTAKE /  OUTPUT: Intake/Output      11/19 0701 - 11/20 0700 11/20 0701 - 11/21 0700   I.V. (mL/kg) 857.9 (9.7) 77.2 (0.9)   NG/GT 170    IV Piggyback 710 300   Total Intake(mL/kg) 1737.9 (19.6) 377.2 (4.3)   Urine (mL/kg/hr) 20 (0)    Emesis/NG output 450    Drains 1970    Other 3346 1002   Total Output 5786 1002   Net -4048.1 -624.8          PHYSICAL EXAMINATION: General:  Chronically ill-appearing 66 year old male appears older than stated age.  Neuro:  Sedated and intubated.  Follows commands when sedation held HEENT:  Normal systolic, no JVD. Cardiovascular:  No murmur rub or gallop. Regular rhythm rapid rate Lungs: Coarse BS diffusely. Abdomen:  Distended, firm, generalized discomfort to palpation. Has shifting dullness to percussion. Positive bowel sounds. Musculoskeletal:  Generalized weakness but intact Skin:  Lower extremities are both swollen and edematous. Erythemic, warm to touch, and patient reports significant discomfort to gentle palpation bilaterally   LABS:  Lab 08/18/12 1038 08/18/12 0507 08/18/12 0427 08/17/12 1600 08/17/12 0917 08/17/12 0411 08/17/12 0410 08/16/12 0930 08/16/12 0500 08/15/12 1146 08/15/12 0355 08/14/12 0512 08/13/12 1200 08/13/12 1130 08/13/12 0454  HGB -- 11.9* -- -- -- 11.3* -- -- 11.8* -- -- -- -- -- --  WBC -- 36.9* -- -- -- 29.7* -- -- 30.5* -- -- -- -- -- --  PLT -- 83* -- -- -- 112* -- -- 232 -- -- -- -- -- --  NA -- 137 -- 135 -- -- 137 -- -- -- -- -- -- -- --  K -- 5.3* -- 4.6 -- -- -- -- -- -- -- -- -- -- --  CL -- 101 -- 100 -- -- 99 -- -- -- -- -- -- -- --  CO2 -- 21 -- 22 -- -- 21 -- -- -- -- -- -- -- --  GLUCOSE -- 164* -- 172* -- -- 177* -- -- -- -- -- -- -- --  BUN -- 52* -- 57* -- -- 54* -- -- -- -- -- -- -- --  CREATININE -- 2.00* -- 2.17* -- -- 2.10* -- -- -- -- -- -- -- --  CALCIUM -- 6.8* -- 6.7* -- -- 7.5* -- -- -- -- -- -- -- --  MG -- 2.5 -- -- -- -- 2.5 -- 2.0 -- -- -- -- -- --  PHOS -- 6.0* -- 6.4* -- -- 5.9* -- -- -- --  -- -- -- --  AST -- -- -- -- -- -- -- 87* -- -- -- 109* -- -- 70*  ALT -- -- -- -- -- -- -- 34 -- -- -- 42 -- -- 34  ALKPHOS -- -- -- -- -- -- -- 207* -- -- -- 187* -- -- 173*  BILITOT -- -- -- -- -- -- -- 1.8* -- -- -- 1.7* -- -- 2.2*  PROT -- -- -- -- -- -- -- 5.9* -- -- -- 5.5* -- -- 6.0  ALBUMIN -- 2.4* -- 2.5* -- -- 3.5 -- -- -- -- -- -- -- --  APTT -- -- -- -- -- -- -- -- -- 34 -- -- -- -- --  INR -- -- -- -- -- -- -- -- -- 1.35 -- -- 1.72* -- --  LATICACIDVEN -- -- -- -- -- -- -- -- -- -- -- 2.9* -- -- --  TROPONINI -- -- -- -- -- -- -- -- -- -- -- -- -- -- --  PROCALCITON -- -- -- -- -- -- -- -- -- -- 5.71 4.36 -- 3.86 --  PROBNP -- -- -- -- -- -- -- -- -- -- -- -- -- -- --  O2SATVEN -- -- -- -- -- -- -- -- -- -- -- -- -- -- --  PHART 7.385 -- 7.377 -- 7.432 -- -- -- -- -- -- -- -- -- --  PCO2ART 35.4 -- 35.8 -- 34.0* -- -- -- -- -- -- -- -- -- --  PO2ART 90.0 -- 87.4 -- 69.0* -- -- -- -- -- -- -- -- -- --    Lab 08/18/12 0738 08/18/12 0355 08/17/12 2347 08/17/12 1946 08/17/12 1610  GLUCAP 133* 148* 143* 145* 158*   Lipase     Component Value Date/Time   LIPASE 2399* 08/17/2012 0410    Intake/Output Summary (Last 24 hours) at 08/18/12 1213 Last data filed at 08/18/12 1200  Gross per 24 hour  Intake 1443.66 ml  Output   4428 ml  Net -2984.34 ml   IMAGING: PCXR: 11/18>>>Progressive bibasilar volume loss right greater than left consistent with worsening atelectasis  ECG: ST  DIAGNOSES: Principal Problem:  *Pancreatitis, alcoholic, acute Active Problems:  HTN (hypertension)  Alcohol abuse  Tobacco abuse  HCAP (healthcare-associated pneumonia)  ARF (acute renal failure)  Hyperkalemia  Hyponatremia  Hernia, inguinal  Cholelithiases  Leukocytosis  Pancreatic pseudocyst/cyst  Lower extremity pain, bilateral  Candidal dermatitis  Dehydration  Acute respiratory failure with hypoxia  Alcoholic liver damage  Nonspecific elevation of levels of transaminase or  lactic acid dehydrogenase (LDH)  ASSESSMENT / PLAN:  PULMONARY  ASSESSMENT: Bibasilar right greater than left atelectasis Currently gas exchange and oxygenation are adequate. He has progression of bibasilar volume loss right greater than left. This could reflect healthcare associated pneumonia versus aspiration pneumonia,but would favor progressive atelectasis. He has a decent cough when requested, however not able to expectorate secretions. Certainly at risk for inability to protect airway.  Massive right sided pleural effusion.  PLAN:   ARDS protocol - currently on 5 of PEEP and 40%. ABG and CXR in AM. Stress dose steriods. Thora done with extudative fluid, likely fluid from SBP.  CARDIOVASCULAR  ASSESSMENT:  Systemic inflammatory response syndrome/Sirs and sepsis: in the setting of pancreatitis with evolving pseudocyst. potential sources: being infected pseudocyst, lower extremity cellulitis. Also possible aspiration versus hospital associated pneumonia. Required addition of Vaso over night Lower extremity edema: Bilateral likely reflects cellulitis, however need to rule out deep vein thrombosis.  PLAN:  Continue Norepi and vaso for BP support with negative fluid balance. CVVHD removal negative 100 ml/hr. CVPs q4 hours.  RENAL  ASSESSMENT:   Acute renal failure Probably multi-factorial but hepato-renal syndrome seems to be most likely.  Fluid removal rate was decreased 11-19 r/t hypotension. Metabolic acidosis: improved with CVVHD at negative 100 ml/hr.  PLAN:   CVVHD per renal CVP Q4h KVO IVF  Renal dose medications. Renal following.  GASTROINTESTINAL  ASSESSMENT:   Acute pancreatitis with pseudocyst: still in marked abdominal discomfort, progressive ascites, worsening abdominal distention. Lipase rising  Incarcerated left inguinal hernia: surgery planned for future, but currently elective Cholelithiasis: no evidence of cholecystitis Abnormal LFTs in the setting of  alcoholic hepatitis Protein calorie malnutrition  PLAN:   GI and surgical services are following Follow LFTs GI PX ordered IR for perc drain placement will send fluid for amylase and lipase.  HEMATOLOGIC  ASSESSMENT: Hemodilution related anemia: no current evidence of blood loss Coagulopathy: Mild, probably related to alcohol hepatitis  PLAN:  Monitor coags. Heparin sub q for dvt px. Transfuse for hemoglobin less than 7 and last he developed shock, then goal directed therapy protocol will drive transfusion trigger.  INFECTIOUS  ASSESSMENT:   Sirs/sepsis: Multiple potential sources as follows: Infected pseudocyst, HCAP Versus aspiration pneumonia and lower extremity cellulitis. Possible candida dermatitis both lower extremities.  PLAN:   Follow pending cultures. ABX as above Renal dose abx Continue Nizoral cream to lower extremities.   ENDOCRINE  ASSESSMENT:   Hyperglycemia  PLAN:   Cont CBG SSI as needed  NEUROLOGIC  ASSESSMENT:   Acute encephalopathy: Likely in the setting of sepsis. Additionally concerned about alcohol withdrawal PLAN:   PRN sedation only. Follow ammonia  CLINICAL SUMMARY:  66 year old male patient admitted with abdominal pain and progressive weakness pancreatitis, left inguinal hernia (currently reduced), dehydration and renal failure. Initially admitted to the medical ward with supportive therapy including IV fluids, GI and surgical consultations, and empiric antibiotics. He's had progressive weakness, decreased level of consciousness, and now his ability to adequately protect airway is in question.  Patient was intubated but on 11/17 patient had worsening renal function, renal was called and CVVHD was started.  Currently on Vanc, primaxin and micafungin. Plan for thora today and perc drain placement in IR. May perform diagnostic paracentesis if able to tolerate.  I have personally obtained a history, examined the patient, evaluated laboratory  and imaging results, formulated the assessment and plan and placed orders.  CRITICAL CARE: The patient is critically ill with multiple organ systems failure and requires high complexity decision making for assessment and support, frequent evaluation and titration of therapies, application of advanced monitoring technologies and extensive interpretation of multiple databases. Critical Care Time devoted to patient care services described in this note is 35 minutes.   Alyson Reedy, M.D. [facility] Pulmonary/Critical Care Medicine. Pager: [phone]. After hours pager: [phone].

## 2012-08-18 NOTE — Progress Notes (Signed)
[facility]/Shefali Ng Progress Note    Subjective: Patient had a thoracentesis and paracentesis yesterday.  Abdominal fluid was not sent for lipase level.    Objective: Vital signs in last 24 hours: Temp:  [97.3 F (36.3 C)-98.3 F (36.8 C)] 98.3 F (36.8 C) (11/20 0402) Pulse Rate:  [71-94] 90  (11/20 0814) Resp:  [24-35] 30  (11/20 0814) BP: (104-175)/(41-79) 117/66 mmHg (11/20 0814) SpO2:  [98 %-100 %] 100 % (11/20 0814) Arterial Line BP: (95-143)/(48-71) 116/65 mmHg (11/20 0700) FiO2 (%):  [50 %-60 %] 50 % (11/20 0814) Weight:  [88.6 kg (195 lb 5.2 oz)] 88.6 kg (195 lb 5.2 oz) (11/20 0448) Last BM Date: 08/15/12  Intake/Output from previous day: 11/19 0701 - 11/20 0700 In: 1737.9 [I.V.:857.9; NG/GT:170; IV Piggyback:710] Out: 5786 [Urine:20; Emesis/NG output:450; Drains:1970] Intake/Output this shift: Total I/O In: 221 [I.V.:21; IV Piggyback:200] Out: 120 [Other:120]  General: Seems to be much more comfortable today.  Lungs: Clear  Abd: Much softer, some bowel sounds  Extremities: Edematous  Neuro: Sedated on the ventilator.  Lab Results:  @LABLAST2 (wbc:2,hgb:2,hct:2,plt:2) BMET  Basename 08/18/12 0507 08/17/12 1600  NA 137 135  K 5.3* 4.6  CL 101 100  CO2 21 22  GLUCOSE 164* 172*  BUN 52* 57*  CREATININE 2.00* 2.17*  CALCIUM 6.8* 6.7*   PT/INR  Basename 08/15/12 1146  LABPROT 16.4*  INR 1.35   ABG  Basename 08/18/12 0427 08/17/12 0917  PHART 7.377 7.432  HCO3 20.5 22.8    Studies/Results: Ct Guided Abscess Drain  08/17/2012  *RADIOLOGY REPORT*  Indication: Perihepatic fluid, elevated white cell count, clinical concern for ruptured pancreatic pseudocyst  CT GUIDED PERIHEPATIC DRAINAGE CATHETER PLACEMENT  Comparison: CT abdomen pelvis - 12-Aug-2012  Intravenous Medications: None  Complications: None immediate  Technique / Findings:  Informed written consent was obtained from the patient's wife after a discussion of the risks, benefits and alternatives to  treatment. The patient was placed supine on the CT gantry and a pre procedural CT was performed re-demonstrating the known abscess/fluid collection adjacent to the lateral aspect the right lobe the liver. The procedure was planned.   A timeout was performed prior to the initiation of the procedure  The right lateral abdomen was prepped and draped in the usual sterile fashion.   The overlying soft tissues were anesthetized with 1% lidocaine with epinephrine.  Appropriate trajectory was planned with the use of a 22 gauge spinal needle.  An 18 gauge trocar needle was advanced into the abscess/fluid collection and a short Amplatz super stiff wire was coiled within the collection. Appropriate positioning was confirmed with a limited CT scan.  The tract was serially dilated allowing placement of a 10 [place] all- purpose drainage catheter.  Appropriate positioning was confirmed with a limited postprocedural CT scan.  100 ml of serous, slightly blood tinged fluid was aspirated.  The tube was connected to a drainage bag and sutured in place.  A dressing was placed.  The patient tolerated the procedure well without immediate post procedural complication.  Impression:  Successful CT guided placement of a 10 [place] all purpose drain catheter into the peri hepatic fluid collection with aspiration of 100 mL of serious, slightly blood tinged fluid.  Samples were sent to the laboratory as requested by the ordering clinical team.   Original Report Authenticated By: Tacey Ruiz, MD    Dg Chest [facility] 1 View  08/18/2012  *RADIOLOGY REPORT*  Clinical Data: Assess endotracheal tube.  PORTABLE CHEST - 1 VIEW  Comparison: Chest radiograph 08/17/2012.  Findings: Endotracheal tube is in satisfactory position. Nasogastric tube is followed into the stomach with the tip projecting beyond the inferior boundary of the film.  Right PICC and left IJ central line tips project over the SVC.  Heart size normal.  Large loculated right pleural  effusion and right lung air space disease persist.  Increasing left lower lobe air space disease.  IMPRESSION:  1.  Large loculated right pleural effusion and right lung air space disease persist. 2.  Increasing left lower lobe air space disease.   Original Report Authenticated By: Leanna Battles, M.D.    Dg Chest Port 1 View  08/17/2012  *RADIOLOGY REPORT*  Clinical Data: Post thoracentesis  PORTABLE CHEST - 1 VIEW  Comparison: 08/16/2012  Findings: Decrease in right pleural effusion compared with earlier today.  There remains a moderately large right effusion.  No pneumothorax.  There is bibasilar atelectasis.  Endotracheal tube in good position.  Left jugular catheter tip in the SVC.  NG tube enters the stomach.  Right arm PICC tip in the SVC.  IMPRESSION: Decrease in right pleural effusion following thoracentesis.  No pneumothorax.   Original Report Authenticated By: Janeece Riggers, M.D.     Anti-infectives: Anti-infectives     Start     Dose/Rate Route Frequency Ordered Stop   08/17/12 0900   vancomycin (VANCOCIN) IVPB 1000 mg/200 mL premix        1,000 mg 200 mL/hr over 60 Minutes Intravenous Every 24 hours 08/17/12 0730     08/16/12 1200   micafungin (MYCAMINE) 100 mg in sodium chloride 0.9 % 100 mL IVPB        100 mg 100 mL/hr over 1 Hours Intravenous Daily 08/16/12 1146     08/15/12 1300   imipenem-cilastatin (PRIMAXIN) 250 mg in sodium chloride 0.9 % 100 mL IVPB        250 mg 200 mL/hr over 30 Minutes Intravenous 4 times per day 08/15/12 1119     08/13/12 1400   vancomycin (VANCOCIN) 750 mg in sodium chloride 0.9 % 150 mL IVPB  Status:  Discontinued        750 mg 150 mL/hr over 60 Minutes Intravenous Every 12 hours 08/13/12 1254 08/15/12 1115   08/13/12 1400   imipenem-cilastatin (PRIMAXIN) 500 mg in sodium chloride 0.9 % 100 mL IVPB  Status:  Discontinued        500 mg 200 mL/hr over 30 Minutes Intravenous 3 times per day 08/13/12 1254 08/15/12 1119   08/12/12 1700    levofloxacin (LEVAQUIN) IVPB 750 mg  Status:  Discontinued        750 mg 100 mL/hr over 90 Minutes Intravenous Every 48 hours 08/24/2012 1749 08/12/12 1130   08/12/12 0600   vancomycin (VANCOCIN) IVPB 1000 mg/200 mL premix  Status:  Discontinued        1,000 mg 200 mL/hr over 60 Minutes Intravenous Every 24 hours 08/11/12 1141 08/12/12 1130   08/05/2012 2200   piperacillin-tazobactam (ZOSYN) IVPB 3.375 g  Status:  Discontinued        3.375 g 12.5 mL/hr over 240 Minutes Intravenous Every 8 hours 08/17/2012 1749 08/12/12 1130   08/27/2012 1800   vancomycin (VANCOCIN) 750 mg in sodium chloride 0.9 % 150 mL IVPB  Status:  Discontinued        750 mg 150 mL/hr over 60 Minutes Intravenous Every 12 hours 08/14/2012 1749 08/11/12 1141   08/02/2012 1530   vancomycin (VANCOCIN) IVPB 1000 mg/200 mL  premix  Status:  Discontinued        1,000 mg 200 mL/hr over 60 Minutes Intravenous  Once 09/04/12 1528 September 04, 2012 1808   2012/09/04 1530  piperacillin-tazobactam (ZOSYN) IVPB 3.375 g       3.375 g 100 mL/hr over 30 Minutes Intravenous  Once 09/04/12 1528 September 04, 2012 1630   09/04/12 1530   levofloxacin (LEVAQUIN) IVPB 750 mg  Status:  Discontinued        750 mg 100 mL/hr over 90 Minutes Intravenous  Once 04-Sep-2012 1528 09/04/12 1808          Assessment/Plan: s/p  Need to send abdominal fluid for amylase and lipase level.  No surgery needed at this time.  LOS: 8 days   Marta Lamas. Gae Bon, MD, FACS [phone] [facility] 08/18/2012

## 2012-08-18 NOTE — Progress Notes (Signed)
Patient ID: VIRAAJ VORNDRAN, male   DOB: 10-26-1945, 66 y.o.   MRN: [id number]   [facility] Progress Note    Subjective:   CRRT restarted yesterday afternoon following perihepatic drainage catheter placement. Also noted to have had thoracentesis yesterday. CRRT continued uninterrupted with an average of -186mL/hr   Objective:   BP 117/66  Pulse 90  Temp 98.3 F (36.8 C) (Oral)  Resp 30  Ht 5\' 4"  (1.626 m)  Wt 88.6 kg (195 lb 5.2 oz)  BMI 33.53 kg/m2  SpO2 100%  Intake/Output Summary (Last 24 hours) at 08/18/12 5621 Last data filed at 08/18/12 3086  Gross per 24 hour  Intake 1893.73 ml  Output   5906 ml  Net -4012.27 ml   Weight change: -6.3 kg (-13 lb 14.2 oz)  Physical Exam: VHQ:IONGEXBMW, sedated but withdraws to pain/touch UXL:KGMWN RRR, normal S1 and S2  Resp: Decreased BS right base, otherwise coarse/mechanical BS UUV:OZDG, obese, drain in situ with bilious appearing drainage Ext:2-3+ edema/anasarca  Imaging: Ct Guided Abscess Drain  08/17/2012  *RADIOLOGY REPORT*  Indication: Perihepatic fluid, elevated white cell count, clinical concern for ruptured pancreatic pseudocyst  CT GUIDED PERIHEPATIC DRAINAGE CATHETER PLACEMENT  Comparison: CT abdomen pelvis - 08/24/2012  Intravenous Medications: None  Complications: None immediate  Technique / Findings:  Informed written consent was obtained from the patient's wife after a discussion of the risks, benefits and alternatives to treatment. The patient was placed supine on the CT gantry and a pre procedural CT was performed re-demonstrating the known abscess/fluid collection adjacent to the lateral aspect the right lobe the liver. The procedure was planned.   A timeout was performed prior to the initiation of the procedure  The right lateral abdomen was prepped and draped in the usual sterile fashion.   The overlying soft tissues were anesthetized with 1% lidocaine with epinephrine.  Appropriate trajectory was  planned with the use of a 22 gauge spinal needle.  An 18 gauge trocar needle was advanced into the abscess/fluid collection and a short Amplatz super stiff wire was coiled within the collection. Appropriate positioning was confirmed with a limited CT scan.  The tract was serially dilated allowing placement of a 10 [place] all- purpose drainage catheter.  Appropriate positioning was confirmed with a limited postprocedural CT scan.  100 ml of serous, slightly blood tinged fluid was aspirated.  The tube was connected to a drainage bag and sutured in place.  A dressing was placed.  The patient tolerated the procedure well without immediate post procedural complication.  Impression:  Successful CT guided placement of a 10 [place] all purpose drain catheter into the peri hepatic fluid collection with aspiration of 100 mL of serious, slightly blood tinged fluid.  Samples were sent to the laboratory as requested by the ordering clinical team.   Original Report Authenticated By: Tacey Ruiz, MD    Dg Chest [facility] 1 View  08/18/2012  *RADIOLOGY REPORT*  Clinical Data: Assess endotracheal tube.  PORTABLE CHEST - 1 VIEW  Comparison: Chest radiograph 08/17/2012.  Findings: Endotracheal tube is in satisfactory position. Nasogastric tube is followed into the stomach with the tip projecting beyond the inferior boundary of the film.  Right PICC and left IJ central line tips project over the SVC.  Heart size normal.  Large loculated right pleural effusion and right lung air space disease persist.  Increasing left lower lobe air space disease.  IMPRESSION:  1.  Large loculated right pleural effusion and right lung air  space disease persist. 2.  Increasing left lower lobe air space disease.   Original Report Authenticated By: Leanna Battles, M.D.    Dg Chest Port 1 View  08/17/2012  *RADIOLOGY REPORT*  Clinical Data: Post thoracentesis  PORTABLE CHEST - 1 VIEW  Comparison: 08/16/2012  Findings: Decrease in right pleural effusion  compared with earlier today.  There remains a moderately large right effusion.  No pneumothorax.  There is bibasilar atelectasis.  Endotracheal tube in good position.  Left jugular catheter tip in the SVC.  NG tube enters the stomach.  Right arm PICC tip in the SVC.  IMPRESSION: Decrease in right pleural effusion following thoracentesis.  No pneumothorax.   Original Report Authenticated By: Janeece Riggers, M.D.     Labs: BMET  Lab 08/18/12 0507 08/17/12 1600 08/17/12 0410 08/16/12 1600 08/16/12 0620 08/16/12 0500 08/15/12 1854 08/15/12 1146 08/15/12 0355  NA 137 135 137 137 -- 138 140 138 --  K 5.3* 4.6 4.7 4.5 -- 3.3* 4.0 4.3 --  CL 101 100 99 100 -- 87* 102 102 --  CO2 21 22 21 21  -- >45* 21 23 --  GLUCOSE 164* 172* 177* 142* 181* 784* 220* -- --  BUN 52* 57* 54* 54* -- 47* 70* 68* --  CREATININE 2.00* 2.17* 2.10* 2.23* -- 1.72* 2.55* 2.63* --  ALB -- -- -- -- -- -- -- -- --  CALCIUM 6.8* 6.7* 7.5* 7.8* -- 6.0* 7.0* 7.6* --  PHOS 6.0* 6.4* 5.9* 5.6* -- 4.0 5.7* -- 4.7*   CBC  Lab 08/18/12 0507 08/17/12 0411 08/16/12 0500 08/15/12 0355  WBC 36.9* 29.7* 30.5* 17.2*  NEUTROABS -- 27.6* -- --  HGB 11.9* 11.3* 11.8* 11.5*  HCT 34.7* 32.6* 34.1* 32.9*  MCV 100.3* 101.6* 102.4* 102.8*  PLT 83* 112* 232 220    Medications:      . albuterol  6 puff Inhalation Q6H  . antiseptic oral rinse  15 mL Mouth Rinse QID  . chlorhexidine  15 mL Mouth/Throat BID  . heparin subcutaneous  5,000 Units Subcutaneous Q8H  . hydrocortisone sod succinate (SOLU-CORTEF) injection  50 mg Intravenous Q6H  . imipenem-cilastatin  250 mg Intravenous Q6H  . insulin aspart  0-9 Units Subcutaneous Q4H  . ketoconazole  1 application Topical BID  . [COMPLETED] lidocaine      . micafungin [facility]) IV  100 mg Intravenous Daily  . pantoprazole (PROTONIX) IV  40 mg Intravenous Q24H  . sodium chloride  10-40 mL Intracatheter Q12H  . sodium chloride  3 mL Intravenous Q12H  . sodium chloride irrigation  5 mL  Irrigation Q8H  . vancomycin  1,000 mg Intravenous Q24H     Assessment/ Plan:   1. ARF, oliguric- most consistent with ischemic ATN, on the ddx would be abdominal compartment syndrome and AIN. Currently back on CRRT for clearance and volume management. We'll attempt to increase net fluid removal rate as BP tolerates later today. Remains on pressors. Surprising to see current K level- will recheck again today at noon and decide on switching CVVHD fluids. 2. VDRF- per PCCM, will continue gentle UF for pulmonary unloading and facilitating wean  3. SIRS secondary to pancreatitis- on low dose pressors and CRRT support as above, on multiple pressors. Off BSABx. Plans noted for possible percutaneous drainage of pancreatic pseudocyst by interventional radiology today.  4. Pleural Effusion- S/P thoracentesis- studies pending   5. Ascites with H/O cirrhosis-  Monitor closely paracentesis needs (would give 100gm albumin IV if >4L fluid  drained)   Zetta Bills, MD 08/18/2012, 8:23 AM

## 2012-08-19 ENCOUNTER — Inpatient Hospital Stay (HOSPITAL_COMMUNITY): Payer: Medicare Other

## 2012-08-19 DIAGNOSIS — B372 Candidiasis of skin and nail: Secondary | ICD-10-CM

## 2012-08-19 LAB — POCT I-STAT 3, ART BLOOD GAS (G3+)
Patient temperature: 98.6
pH, Arterial: 7.304 — ABNORMAL LOW (ref 7.350–7.450)

## 2012-08-19 LAB — BASIC METABOLIC PANEL
BUN: 47 mg/dL — ABNORMAL HIGH (ref 6–23)
CO2: 17 mEq/L — ABNORMAL LOW (ref 19–32)
Chloride: 99 mEq/L (ref 96–112)
Creatinine, Ser: 1.72 mg/dL — ABNORMAL HIGH (ref 0.50–1.35)
Glucose, Bld: 155 mg/dL — ABNORMAL HIGH (ref 70–99)

## 2012-08-19 LAB — RENAL FUNCTION PANEL
Albumin: 1.9 g/dL — ABNORMAL LOW (ref 3.5–5.2)
BUN: 45 mg/dL — ABNORMAL HIGH (ref 6–23)
CO2: 21 mEq/L (ref 19–32)
CO2: 18 mEq/L — ABNORMAL LOW (ref 19–32)
Calcium: 7 mg/dL — ABNORMAL LOW (ref 8.4–10.5)
Chloride: 99 mEq/L (ref 96–112)
GFR calc Af Amer: 46 mL/min — ABNORMAL LOW (ref >90)
Glucose, Bld: 139 mg/dL — ABNORMAL HIGH (ref 70–99)
Glucose, Bld: 190 mg/dL — ABNORMAL HIGH (ref 70–99)
Potassium: 5.5 mEq/L — ABNORMAL HIGH (ref 3.5–5.1)
Sodium: 136 mEq/L (ref 135–145)

## 2012-08-19 LAB — GLUCOSE, CAPILLARY
Glucose-Capillary: 119 mg/dL — ABNORMAL HIGH (ref 70–99)
Glucose-Capillary: 149 mg/dL — ABNORMAL HIGH (ref 70–99)
Glucose-Capillary: 178 mg/dL — ABNORMAL HIGH (ref 70–99)
Glucose-Capillary: 84 mg/dL (ref 70–99)

## 2012-08-19 LAB — CBC
HCT: 37.9 % — ABNORMAL LOW (ref 39.0–52.0)
Platelets: 69 10*3/uL — ABNORMAL LOW (ref 150–400)
RBC: 3.7 MIL/uL — ABNORMAL LOW (ref 4.22–5.81)
RDW: 16.4 % — ABNORMAL HIGH (ref 11.5–15.5)
WBC: 42.7 10*3/uL — ABNORMAL HIGH (ref 4.0–10.5)

## 2012-08-19 LAB — APTT: aPTT: 51 seconds — ABNORMAL HIGH (ref 24–37)

## 2012-08-19 MED ORDER — VITAMIN K1 10 MG/ML IJ SOLN
10.0000 mg | Freq: Once | INTRAVENOUS | Status: AC
  Administered 2012-08-19: 10 mg via INTRAVENOUS
  Filled 2012-08-19: qty 1

## 2012-08-19 MED ORDER — LINEZOLID 2 MG/ML IV SOLN
600.0000 mg | Freq: Two times a day (BID) | INTRAVENOUS | Status: DC
  Administered 2012-08-19 – 2012-08-22 (×7): 600 mg via INTRAVENOUS
  Filled 2012-08-19 (×8): qty 300

## 2012-08-19 MED ORDER — PRISMASOL BGK 0/2.5 32-2.5 MEQ/L IV SOLN
INTRAVENOUS | Status: DC
  Administered 2012-08-19: 18:00:00 via INTRAVENOUS_CENTRAL
  Filled 2012-08-19 (×2): qty 5000

## 2012-08-19 MED ORDER — CLINIMIX/DEXTROSE (5/15) 5 % IV SOLN
INTRAVENOUS | Status: AC
  Administered 2012-08-19: 18:00:00 via INTRAVENOUS
  Filled 2012-08-19: qty 1000

## 2012-08-19 NOTE — Progress Notes (Signed)
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  No Known Allergies  Patient Measurements: Height: 5\' 4"  (162.6 cm) Weight: 194 lb 0.1 oz (88 kg) IBW/kg (Calculated) : 59.2   Vital Signs: Temp: 97.6 F (36.4 C) (11/21 1200) Temp src: Oral (11/21 1200) BP: 101/66 mmHg (11/21 1208) Pulse Rate: 84  (11/21 1208) Intake/Output from previous day: 11/20 0701 - 11/21 0700 In: 1475.9 [I.V.:575.9; NG/GT:90; IV Piggyback:810] Out: 4112 [Emesis/NG output:200; Drains:30] Intake/Output from this shift: Total I/O In: 665.5 [I.V.:225.5; NG/GT:40; IV Piggyback:400] Out: 1076 [Other:1076]  Labs:  [facility] 08/19/12 0420 08/18/12 0507 08/17/12 0411  WBC 42.7* 36.9* 29.7*  HGB 13.1 11.9* 11.3*  HCT 37.9* 34.7* 32.6*  PLT 69* 83* 112*  APTT -- -- --  INR -- -- --     Basename 08/19/12 0420 08/18/12 1200 08/18/12 0507 08/17/12 1600 08/17/12 0410  NA 136 136 137 -- --  K 5.4* 4.9 5.3* -- --  CL 100 101 101 -- --  CO2 21 20 21  -- --  GLUCOSE 139* 159* 164* -- --  BUN 45* 49* 52* -- --  CREATININE 1.71* 1.91* 2.00* -- --  LABCREA -- -- -- -- --  CREAT24HRUR -- -- -- -- --  CALCIUM 7.0* 6.7* 6.8* -- --  MG 2.6* -- 2.5 -- 2.5  PHOS 6.5* -- 6.0* 6.4* --  PROT -- -- -- -- --  ALBUMIN 2.2* -- 2.4* 2.5* --  AST -- -- -- -- --  ALT -- -- -- -- --  ALKPHOS -- -- -- -- --  BILITOT -- -- -- -- --  BILIDIR -- -- -- -- --  IBILI -- -- -- -- --  PREALBUMIN -- -- -- -- --  TRIG -- -- -- -- --  CHOLHDL -- -- -- -- --  CHOL -- -- -- -- --   Estimated Creatinine Clearance: 41.9 ml/min (by C-G formula based on Cr of 1.71).    Basename 08/19/12 1130 08/19/12 0738 08/19/12 0408  GLUCAP 149* 131* 96    Medical History: Past Medical History  Diagnosis Date  . Hypertension   . GERD (gastroesophageal reflux disease)   . Pneumonia     "5-7 years ago" (08/03/2012)  . Exertional dyspnea     "just recently"  (08/03/2012)  . Delirium, acute 08/03/2012  . Prostate cancer     Medications:  Prescriptions prior to admission  Medication Sig Dispense Refill  . aspirin EC 81 MG tablet Take 162 mg by mouth daily.      . cholecalciferol (VITAMIN D) 1000 UNITS tablet Take 1,000 Units by mouth daily.      . ciprofloxacin (CIPRO) 500 MG tablet Take 1 tablet (500 mg total) by mouth 2 (two) times daily.  10 tablet  0  . ciprofloxacin (CIPRO) 500 MG tablet Take 500 mg by mouth 2 (two) times daily. For 10 days starting on 08/07/12      . feeding supplement (ENSURE COMPLETE) LIQD Take 237 mLs by mouth 2 (two) times daily between meals.  60 Bottle  0  . folic acid (FOLVITE) 1 MG tablet Take 1 tablet (1 mg total) by mouth daily.  30 tablet  0  . guaiFENesin-dextromethorphan (ROBITUSSIN DM) 100-10 MG/5ML syrup Take 5 mLs by mouth every 4 (four) hours as needed for cough.  118 mL    . lisinopril (PRINIVIL,ZESTRIL) 10 MG tablet Take 10 mg by mouth daily.      . metroNIDAZOLE (  FLAGYL) 500 MG tablet Take 500 mg by mouth 3 (three) times daily. For 10 days starting on 08/07/12      . Multiple Vitamin (MULTIVITAMIN WITH MINERALS) TABS Take 1 tablet by mouth daily.  30 tablet  0  . omeprazole (PRILOSEC) 20 MG capsule Take 20 mg by mouth daily.      Marland Kitchen thiamine 100 MG tablet Take 1 tablet (100 mg total) by mouth daily.  30 tablet  0  . vitamin E 400 UNIT capsule Take 800 Units by mouth daily.        Insulin Requirements in the past 24 hours:  4 units SSI  Current Nutrition:  NPO  Assessment:  66 yom admitted on 11/12 with generalized weakness, fatigue and abdominal pain d/t acute pancreatitis and hernia. He has a history of HTN, alcohol abuse, prostate cancer and GERD. Developed encephalopthy and respiratory distress. 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 - elevated amylase/lipase (increasing) - on  PPI Endo: No hx DM - CBGs ok on SSI, also on stress steroids Lytes: Elevated K+, Phos, Mg Renal: Continues on CRRT - Scr improving to 1.71, no UOP documented Pulm: 40% Fio2 Cards: Hx HTN - BP 101/66, HR 82 - norepi + vasopressin gtt Hepatobil: AST slightly elevated, Tbili elevated at 1.8, direct bili 1.4 - INR 3.96, given vit k 10mg  IV x 1 today Neuro: Sedated on fent/versed ID: Linezolid + micafungin + primaxin for ?PNA, pancreatitis, cellulitis - Afebrile, WBC 42.7 Best Practices: Heparin SQ dc'd, no SCDs ordered, PPI TPN Access: PICC TPN day#: 1  Nutritional Goals:  1900-2000 kCal, 118-130 grams of protein per day  Plan:  1. Start Clinimix 5/15 at 25ml/hr - no electrolytes d/t CRRT and elevated K, Mg, Phos 2. Lipids, MVI + trace elements MWF only d/t national shortage 3. F/u AM labs + CBGs 4. F/u clinical progression, CRRT plans  Jdyn Parkerson, Drake Leach 08/19/2012,1:09 PM

## 2012-08-19 NOTE — Progress Notes (Signed)
Patient ID: Gary [name], male   DOB: [birth year], 66 y.o.   MRN: [id number]   [facility] Progress Note    Subjective:   No acute events overnight, no interruptions of dialysis therapy.    Objective:   BP 115/72  Pulse 87  Temp 97.2 F (36.2 C) (Oral)  Resp 26  Ht 5\' 4"  (1.626 m)  Wt 88 kg (194 lb 0.1 oz)  BMI 33.30 kg/m2  SpO2 98%  Intake/Output Summary (Last 24 hours) at 08/19/12 0802 Last data filed at 08/19/12 0750  Gross per 24 hour  Intake 1255.89 ml  Output   3992 ml  Net -2736.11 ml   Weight change: -0.6 kg (-1 lb 5.2 oz)  Physical Exam: Gen: Comfortable on the vent, synchronous with the ventilator CVS: Pulse regular in rate and rhythm, heart sounds S1 and S2 normal Resp: Coarse/mechanical breath sounds bilaterally Abd: Soft, obese, perihepatic drain in situ Ext: 2+ lower extremity edema with some skin blistering. 1+ upper extremity edema.  Imaging: Ct Guided Abscess Drain  08/17/2012  *RADIOLOGY REPORT*  Indication: Perihepatic fluid, elevated white cell count, clinical concern for ruptured pancreatic pseudocyst  CT GUIDED PERIHEPATIC DRAINAGE CATHETER PLACEMENT  Comparison: CT abdomen pelvis - 07/31/2012  Intravenous Medications: None  Complications: None immediate  Technique / Findings:  Informed written consent was obtained from the patient's wife after a discussion of the risks, benefits and alternatives to treatment. The patient was placed supine on the CT gantry and a pre procedural CT was performed re-demonstrating the known abscess/fluid collection adjacent to the lateral aspect the right lobe the liver. The procedure was planned.   A timeout was performed prior to the initiation of the procedure  The right lateral abdomen was prepped and draped in the usual sterile fashion.   The overlying soft tissues were anesthetized with 1% lidocaine with epinephrine.  Appropriate trajectory was planned with the use of a 22 gauge spinal needle.  An 18 gauge  trocar needle was advanced into the abscess/fluid collection and a short Amplatz super stiff wire was coiled within the collection. Appropriate positioning was confirmed with a limited CT scan.  The tract was serially dilated allowing placement of a 10 [place] all- purpose drainage catheter.  Appropriate positioning was confirmed with a limited postprocedural CT scan.  100 ml of serous, slightly blood tinged fluid was aspirated.  The tube was connected to a drainage bag and sutured in place.  A dressing was placed.  The patient tolerated the procedure well without immediate post procedural complication.  Impression:  Successful CT guided placement of a 10 [place] all purpose drain catheter into the peri hepatic fluid collection with aspiration of 100 mL of serious, slightly blood tinged fluid.  Samples were sent to the laboratory as requested by the ordering clinical team.   Original Report Authenticated By: Tacey Ruiz, MD    Dg Chest [facility] 1 View  08/18/2012  *RADIOLOGY REPORT*  Clinical Data: Assess endotracheal tube.  PORTABLE CHEST - 1 VIEW  Comparison: Chest radiograph 08/17/2012.  Findings: Endotracheal tube is in satisfactory position. Nasogastric tube is followed into the stomach with the tip projecting beyond the inferior boundary of the film.  Right PICC and left IJ central line tips project over the SVC.  Heart size normal.  Large loculated right pleural effusion and right lung air space disease persist.  Increasing left lower lobe air space disease.  IMPRESSION:  1.  Large loculated right pleural effusion and right lung  air space disease persist. 2.  Increasing left lower lobe air space disease.   Original Report Authenticated By: Leanna Battles, M.D.    Dg Chest Port 1 View  08/17/2012  *RADIOLOGY REPORT*  Clinical Data: Post thoracentesis  PORTABLE CHEST - 1 VIEW  Comparison: 08/16/2012  Findings: Decrease in right pleural effusion compared with earlier today.  There remains a moderately large  right effusion.  No pneumothorax.  There is bibasilar atelectasis.  Endotracheal tube in good position.  Left jugular catheter tip in the SVC.  NG tube enters the stomach.  Right arm PICC tip in the SVC.  IMPRESSION: Decrease in right pleural effusion following thoracentesis.  No pneumothorax.   Original Report Authenticated By: Janeece Riggers, M.D.     Labs: BMET  Lab 08/19/12 0420 08/18/12 1200 08/18/12 0507 08/17/12 1600 08/17/12 0410 08/16/12 1600 08/16/12 0620 08/16/12 0500 08/15/12 1854  NA 136 136 137 135 137 137 -- 138 --  K 5.4* 4.9 5.3* 4.6 4.7 4.5 -- 3.3* --  CL 100 101 101 100 99 100 -- 87* --  CO2 21 20 21 22 21 21  -- >45* --  GLUCOSE 139* 159* 164* 172* 177* 142* 181* -- --  BUN 45* 49* 52* 57* 54* 54* -- 47* --  CREATININE 1.71* 1.91* 2.00* 2.17* 2.10* 2.23* -- 1.72* --  ALB -- -- -- -- -- -- -- -- --  CALCIUM 7.0* 6.7* 6.8* 6.7* 7.5* 7.8* -- 6.0* --  PHOS 6.5* -- 6.0* 6.4* 5.9* 5.6* -- 4.0 5.7*   CBC  Lab 08/19/12 0420 08/18/12 0507 08/17/12 0411 08/16/12 0500  WBC 42.7* 36.9* 29.7* 30.5*  NEUTROABS -- -- 27.6* --  HGB 13.1 11.9* 11.3* 11.8*  HCT 37.9* 34.7* 32.6* 34.1*  MCV 102.4* 100.3* 101.6* 102.4*  PLT 69* 83* 112* 232    Medications:      . albuterol  6 puff Inhalation Q6H  . antiseptic oral rinse  15 mL Mouth Rinse QID  . chlorhexidine  15 mL Mouth/Throat BID  . heparin subcutaneous  5,000 Units Subcutaneous Q8H  . hydrocortisone sod succinate (SOLU-CORTEF) injection  50 mg Intravenous Q6H  . imipenem-cilastatin  500 mg Intravenous Q6H  . insulin aspart  0-9 Units Subcutaneous Q4H  . ketoconazole  1 application Topical BID  . micafungin [facility]) IV  100 mg Intravenous Daily  . pantoprazole (PROTONIX) IV  40 mg Intravenous Q24H  . sodium chloride  10-40 mL Intracatheter Q12H  . sodium chloride irrigation  5 mL Irrigation Q8H  . vancomycin  1,000 mg Intravenous Q24H  . [DISCONTINUED] imipenem-cilastatin  250 mg Intravenous Q6H  . [DISCONTINUED] sodium  chloride  3 mL Intravenous Q12H     Assessment/ Plan:   1. ARF, oliguric- most consistent with ischemic ATN with the differentials of AIN/intra-abdominal hypertension. Continue him on the current CR RT prescription for clearance/volume management. We'll attempt to increase net fluid removal rate as BP tolerates later- previous attempts at increasing ultrafiltration goal have been limited by hypotension and increasing pressor needs. Currently, he remains on pressors. With rising potassium levels, question ongoing tissue injuries/potassium release. We'll reassess potassium levels with known today to decide on need to switch pre-and post filter fluid to 0K.   2. VDRF- per PCCM, will continue gentle UF for pulmonary unloading and facilitating wean  3. SIRS secondary to pancreatitis- on low dose pressors and CRRT support as above, on multiple pressors. Off BSABx. Plans noted for possible percutaneous drainage of pancreatic pseudocyst by interventional radiology  today.  4. Pleural Effusion- S/P thoracentesis- studies pending  5. Ascites with H/O cirrhosis-   Zetta Bills, MD 08/19/2012, 8:02 AM

## 2012-08-19 NOTE — Procedures (Signed)
[place]   1. Limited fluid ascites 2. No pocket fluid   Mcarthur Rossetti. Tyson Alias, MD, FACP Pgr: [phone] [place] [facility]

## 2012-08-19 NOTE — Progress Notes (Signed)
PULMONARY  / CRITICAL CARE MEDICINE  Name: Gary [name] MRN: [id number] DOB: Jan 28, 1946    LOS: 9  REFERRING MD :  [name]  CHIEF COMPLAINT: encephalopathy, respiratory insufficiency  BRIEF PATIENT DESCRIPTION:  66 year old white male admitted 11/12 for abdominal pain in the setting of acute pancreatitis an incarcerated left inguinal hernia. Initially admitted to medicine service. Critical care asked to see on 11/15 for encephalopathy, and concern for progressive respiratory distress and inability to protect airway.  LINES / TUBES: RUE PICC line 11/15>>> L IJ HD Cath 11/17>>> R Rad Aline 11/16>>>  CULTURES: 11/16 Resp >>>CANDIDA ALBICANS 11/13 urine culture: Negative 11/12 blood culture x2>>>Negative  ANTIBIOTICS: Levofloxacin 11/14>>>11/14>1 dose. Zosyn 11/12>>>11/14 Primaxin 11/15>>> Vancomycin 11/12>>>11/14>>> 11/15>>> Micafungin 11/18>>>  SIGNIFICANT EVENTS:  CT abdomen 11/12: Enlargement of. Pancreatic fluid collection with significant mass effect on both right and left lobe of liver. Suggesting enlarging pseudocysts. Nonspecific nodularity edema of the mesentery with no focal mass. Stable left inguinal hernia. Abdominal ultrasound 11/12: gallstone with echogenic sludge, but no evidence of biliary obstruction or acute cholecystitis. Increased ascites compared to CT 7 days prior fluid around the liver with mass effect. ECHO 11/12: Systolic function was normal. The estimated ejection fraction was in the range of 50% to 55%. There was an increased relative contribution of atrial contraction to ventricular filling.  Bilateral LE ultrasound on11/15>>>>no DVT  LEVEL OF CARE:  ICU PRIMARY SERVICE:  [facility] CONSULTANTS:  PCCM, nephro CODE STATUS full code DIET:  N.p.o. DVT Px:  Subcutaneous heparin GI Px: Protonix  VITAL SIGNS: Temp:  [97.2 F (36.2 C)-97.9 F (36.6 C)] 97.9 F (36.6 C) (11/21 0800) Pulse Rate:  [71-97] 71  (11/21 1000) Resp:  [12-33] 23  (11/21  1000) BP: (115-136)/(66-80) 130/78 mmHg (11/21 0820) SpO2:  [98 %-100 %] 100 % (11/21 1000) Arterial Line BP: (79-176)/(60-96) 176/96 mmHg (11/21 1000) FiO2 (%):  [40 %-50 %] 40 % (11/21 1000) Weight:  [88 kg (194 lb 0.1 oz)] 88 kg (194 lb 0.1 oz) (11/21 0500)  HEMODYNAMICS: CVP:  [6 mmHg-14 mmHg] 14 mmHg  VENTILATOR SETTINGS: Vent Mode:  [-] PRVC FiO2 (%):  [40 %-50 %] 40 % Set Rate:  [30 bmp] 30 bmp Vt Set:  [470 mL] 470 mL PEEP:  [5 cmH20-8 cmH20] 5 cmH20 Plateau Pressure:  [16 cmH20-22 cmH20] 16 cmH20  INTAKE / OUTPUT: Intake/Output      11/20 0701 - 11/21 0700 11/21 0701 - 11/22 0700   I.V. (mL/kg) 575.9 (6.5) 114.1 (1.3)   NG/GT 90 20   IV Piggyback 810 300   Total Intake(mL/kg) 1475.9 (16.8) 434.1 (4.9)   Urine (mL/kg/hr)  0   Emesis/NG output 200    Drains 30    Other 3882 658   Total Output 4112 658   Net -2636.1 -223.9         PHYSICAL EXAMINATION: General:  Chronically ill-appearing 66 year old male appears older than stated age.  Neuro:  Sedated and intubated.  Follows simple commands HEENT:  Normal systolic, no JVD. Cardiovascular:  No murmur rub or gallop. Regular rhythm rapid rate Lungs: Coarse BS diffusely. Abdomen:  Distended, firm, generalized discomfort to palpation. Has shifting dullness to percussion. Positive bowel sounds. Musculoskeletal:  Generalized weakness but intact Skin:  Lower extremities are both swollen and edematous with blisters noted Erythemic, warm to touch, and patient reports significant discomfort to gentle palpation bilaterally   LABS:  Lab 08/19/12 0420 08/19/12 0336 08/18/12 1200 08/18/12 1038 08/18/12 0507 08/18/12 0427 08/17/12 1600 08/17/12 0411  08/17/12 0410 08/16/12 0930 08/15/12 1146 08/15/12 0355 08/14/12 0512 08/13/12 1200 08/13/12 1130 08/13/12 0454  HGB 13.1 -- -- -- 11.9* -- -- 11.3* -- -- -- -- -- -- -- --  WBC 42.7* -- -- -- 36.9* -- -- 29.7* -- -- -- -- -- -- -- --  PLT 69* -- -- -- 83* -- -- 112* -- -- -- -- --  -- -- --  NA 136 -- 136 -- 137 -- -- -- -- -- -- -- -- -- -- --  K 5.4* -- 4.9 -- -- -- -- -- -- -- -- -- -- -- -- --  CL 100 -- 101 -- 101 -- -- -- -- -- -- -- -- -- -- --  CO2 21 -- 20 -- 21 -- -- -- -- -- -- -- -- -- -- --  GLUCOSE 139* -- 159* -- 164* -- -- -- -- -- -- -- -- -- -- --  BUN 45* -- 49* -- 52* -- -- -- -- -- -- -- -- -- -- --  CREATININE 1.71* -- 1.91* -- 2.00* -- -- -- -- -- -- -- -- -- -- --  CALCIUM 7.0* -- 6.7* -- 6.8* -- -- -- -- -- -- -- -- -- -- --  MG 2.6* -- -- -- 2.5 -- -- -- 2.5 -- -- -- -- -- -- --  PHOS 6.5* -- -- -- 6.0* -- 6.4* -- -- -- -- -- -- -- -- --  AST -- -- -- -- -- -- -- -- -- 87* -- -- 109* -- -- 70*  ALT -- -- -- -- -- -- -- -- -- 34 -- -- 42 -- -- 34  ALKPHOS -- -- -- -- -- -- -- -- -- 207* -- -- 187* -- -- 173*  BILITOT -- -- -- -- -- -- -- -- -- 1.8* -- -- 1.7* -- -- 2.2*  PROT -- -- -- -- -- -- -- -- -- 5.9* -- -- 5.5* -- -- 6.0  ALBUMIN 2.2* -- -- -- 2.4* -- 2.5* -- -- -- -- -- -- -- -- --  APTT -- -- -- -- -- -- -- -- -- -- 34 -- -- -- -- --  INR -- -- -- -- -- -- -- -- -- -- 1.35 -- -- 1.72* -- --  LATICACIDVEN -- -- -- -- -- -- -- -- -- -- -- -- 2.9* -- -- --  TROPONINI -- -- -- -- -- -- -- -- -- -- -- -- -- -- -- --  PROCALCITON -- -- -- -- -- -- -- -- -- -- -- 5.71 4.36 -- 3.86 --  PROBNP -- -- -- -- -- -- -- -- -- -- -- -- -- -- -- --  O2SATVEN -- -- -- -- -- -- -- -- -- -- -- -- -- -- -- --  PHART -- 7.304* -- 7.385 -- 7.377 -- -- -- -- -- -- -- -- -- --  PCO2ART -- 42.6 -- 35.4 -- 35.8 -- -- -- -- -- -- -- -- -- --  PO2ART -- 71.0* -- 90.0 -- 87.4 -- -- -- -- -- -- -- -- -- --    Lab 08/19/12 0738 08/19/12 0408 08/19/12 0003 08/18/12 2031 08/18/12 1933  GLUCAP 131* 96 119* 129* 106*   Lipase     Component Value Date/Time   LIPASE 2399* 08/17/2012 0410    Intake/Output Summary (Last 24 hours) at 08/19/12 1059 Last data filed at 08/19/12 1003  Gross per 24 hour  Intake  1650.66 ml  Output   4205 ml  Net -2554.34 ml    IMAGING: PCXR: 11/21>>>Slight decrease in size of rightloculated pleural effusion. Bibasilar predominant opacities favored to represent atelectasis  ECG: ST  DIAGNOSES: Principal Problem:  *Pancreatitis, alcoholic, acute Active Problems:  HTN (hypertension)  Alcohol abuse  Tobacco abuse  HCAP (healthcare-associated pneumonia)  ARF (acute renal failure)  Hyperkalemia  Hyponatremia  Hernia, inguinal  Cholelithiases  Leukocytosis  Pancreatic pseudocyst/cyst  Lower extremity pain, bilateral  Candidal dermatitis  Dehydration  Acute respiratory failure with hypoxia  Alcoholic liver damage  Nonspecific elevation of levels of transaminase or lactic acid dehydrogenase (LDH)  ASSESSMENT / PLAN:  PULMONARY  ASSESSMENT: Acute resp failure Large effusion pleural rt Bibasilar right greater than left atelectasis  PLAN:   ARDS protocol -dc, I see no bilat infiltrates, still goal to keep plat less 30 ABG and CXR in AM. Abg reviewed, min vent is maxed, remain at 30 If plat allows, increase TV to 8 cc/kg MV too high to wean  CARDIOVASCULAR  ASSESSMENT:  Systemic inflammatory response syndrome/Sirs and sepsis: in the setting of pancreatitis with evolving pseudocyst. potential sources: being infected pseudocyst, lower extremity cellulitis. Also possible aspiration versus hospital associated pneumonia. Required addition of Vaso over night Lower extremity edema: Bilateral likely reflects cellulitis, however need to rule out deep vein thrombosis.  PLAN:  Continue Norepi and vaso for BP support with negative fluid balance. CVVHD removal negative 100 ml/hr, negative 2L yesterday CVPs q4 hours. See ID Keep vasopressin Maintain stress steroids  RENAL  ASSESSMENT:   Acute renal failure Probably multi-factorial but hepato-renal syndrome seems to be most likely.   Metabolic acidosis: improved with CVVHD at negative 100 ml/hr.  PLAN:   Follow BMP CVVHD per renal CVP Q4h KVO  IVF  Renal dose medications. Renal following.  GASTROINTESTINAL  ASSESSMENT:   Acute pancreatitis with pseudocyst: still in marked abdominal discomfort, progressive ascites, worsening abdominal distention. 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 PLAN:   GI and surgical services are following Follow LFTs GI PX ordered Drain placed in IR, amylase/lipase positive, cultures pending Pre iCU nutritional status poor, rising lip, lip at near 2500, start TPN  HEMATOLOGIC  ASSESSMENT: Hemodilution related anemia: no current evidence of blood loss Coagulopathy: Mild, probably related to alcohol hepatitis Thrombocytopenia Rising WBC  PLAN:  Check HIT panel, stop heparin, suspicion moderate Monitor coags today, r/o DIC< if elevated, send factor 8 level Transfuse for hemoglobin less than 7  INFECTIOUS  ASSESSMENT:   Sirs/sepsis: Multiple potential sources as follows: Infected pseudocyst, SBP, HCAP vs aspiration pneumonia and lower extremity cellulitis. Possible candida dermatitis both lower extremities, [place] negative Rising wbc, worsening clinical status PLAN:   Continue Imipenem, mycofungin, ensure dose adjusted liver dz Source control? Will [place] abdo, consider ascites tap Drain is in pseudocysts Dc vanc, add linazolid, at risk VRE and lack of progress, follow plat closely Repeat BC uA if urine noted picc appears clean  ENDOCRINE  ASSESSMENT:   Hyperglycemia  PLAN:   Cont CBG SSI as needed  NEUROLOGIC  ASSESSMENT:   Acute encephalopathy: Likely in the setting of sepsis. Additionally concerned about alcohol withdrawal  PLAN:   PRN sedation only. Avoid benzo  CLINICAL SUMMARY:  Remain on pressors, add linazolid. Repeat BC, [place] abdo  I have personally  obtained a history, examined the  patient, evaluated laboratory and imaging results, formulated the assessment and plan and placed orders.  CRITICAL CARE: The patient is critically ill with multiple organ systems failure and requires high complexity decision making for assessment and support, frequent evaluation and titration of therapies, application of advanced monitoring technologies and extensive interpretation of multiple databases. Critical Care Time devoted to patient care services described in this note is 35 minutes.   Mcarthur Rossetti. Tyson Alias, MD, FACP Pgr: [phone] [facility]

## 2012-08-19 NOTE — Consult Note (Signed)
WOC consult Note Reason for Consult: Consult requested for blisters to BLE.  Pt has been critically ill with multiple systemic factors which can impair healing.  He is on vasopressors which have impaired circulation, CVVHD for fluid overload, immobile. Wound type: Previous areas of blistering from generalized edema to BLE have ruptured and evolved into full thickness patchy areas of wounds. Feet purple and mottled, cool to touch.  Left heel with .5X.5cm dark purple deep tissue injury.  Family at bedside, discussed plan of care, barriers to healing, and topical care.  Right leg 3X9X.1cm, 1X1X.1cm, 5X5X.1cm. Left leg 7X7X.1cm. 3X3X.1cm.  Most wounds are red and moist with 10% yellow slough beginning to evolve.  Difficult to keep pressure off back of calf areas; these may evolve into eschar R/T systemic factors.. Pressure Ulcer POA: These are not pressure ulcers. Large amt yellow drainage, generalized edema, no odor. Dressing procedure/placement/frequency: Foam dressings to reduce pressure and promote healing.  If would have increased slough or eschar next week as they continue to evolve, then chemical debridement can be added.  Cammie Mcgee, RN, MSN, [employer]  [phone]

## 2012-08-19 NOTE — Progress Notes (Signed)
ANTIBIOTIC CONSULT NOTE - FOLLOW UP  Pharmacy Consult for imipenem, vancomycin, micafungin Indication: rule out pneumonia, rule out sepsis, pancreatitis, cellulitis, candida  No Known Allergies  Patient Measurements: Height: 5\' 4"  (162.6 cm) Weight: 194 lb 0.1 oz (88 kg) IBW/kg (Calculated) : 59.2    Vital Signs: Temp: 97.2 F (36.2 C) (11/21 0400) Temp src: Oral (11/21 0400) BP: 115/72 mmHg (11/20 2333) Pulse Rate: 87  (11/21 0500) Intake/Output from previous day: 11/20 0701 - 11/21 0700 In: 1466.9 [I.V.:566.9; NG/GT:90; IV Piggyback:810] Out: 4112 [Emesis/NG output:200; Drains:30] Intake/Output from this shift:    Labs:  Basename 08/19/12 0420 08/18/12 1200 08/18/12 0507 08/17/12 0411  WBC 42.7* -- 36.9* 29.7*  HGB 13.1 -- 11.9* 11.3*  PLT 69* -- 83* 112*  LABCREA -- -- -- --  CREATININE 1.71* 1.91* 2.00* --   Estimated Creatinine Clearance: 41.9 ml/min (by C-G formula based on Cr of 1.71).  Microbiology: Recent Results (from the past 720 hour(s))  URINE CULTURE     Status: Normal   Collection Time   08/03/12 11:19 AM      Component Value Range Status Comment   Specimen Description URINE, CLEAN CATCH   Final    Special Requests NONE   Final    Culture  Setup Time 08/04/2012 01:55   Final    Colony Count NO GROWTH   Final    Culture NO GROWTH   Final    Report Status 08/04/2012 FINAL   Final   CULTURE, BLOOD (ROUTINE X 2)     Status: Normal   Collection Time   08/03/12  1:00 PM      Component Value Range Status Comment   Specimen Description Blood   Final    Special Requests NONE   Final    Culture  Setup Time 08/03/2012 15:36   Final    Culture NO GROWTH 5 DAYS   Final    Report Status 08/09/2012 FINAL   Final   CULTURE, BLOOD (ROUTINE X 2)     Status: Normal   Collection Time   08/03/12  1:05 PM      Component Value Range Status Comment   Specimen Description Blood   Final    Special Requests NONE   Final    Culture  Setup Time 08/03/2012 15:36   Final     Culture NO GROWTH 5 DAYS   Final    Report Status 08/09/2012 FINAL   Final   CULTURE, BLOOD (ROUTINE X 2)     Status: Normal   Collection Time   08/21/2012  3:41 PM      Component Value Range Status Comment   Specimen Description BLOOD HAND RIGHT   Final    Special Requests BOTTLES DRAWN AEROBIC ONLY 5CC   Final    Culture  Setup Time 08/08/2012 22:36   Final    Culture NO GROWTH 5 DAYS   Final    Report Status 08/16/2012 FINAL   Final   CULTURE, BLOOD (ROUTINE X 2)     Status: Normal   Collection Time   08/03/2012  3:50 PM      Component Value Range Status Comment   Specimen Description BLOOD HAND LEFT   Final    Special Requests BOTTLES DRAWN AEROBIC ONLY [facility]   Final    Culture  Setup Time 08/11/2012 22:37   Final    Culture NO GROWTH 5 DAYS   Final    Report Status 08/16/2012 FINAL   Final  URINE CULTURE     Status: Normal   Collection Time   08/11/12 12:43 PM      Component Value Range Status Comment   Specimen Description URINE, RANDOM   Final    Special Requests NONE   Final    Culture  Setup Time 08/11/2012 14:45   Final    Colony Count NO GROWTH   Final    Culture NO GROWTH   Final    Report Status 08/12/2012 FINAL   Final   CULTURE, RESPIRATORY     Status: Normal   Collection Time   08/14/12 11:01 PM      Component Value Range Status Comment   Specimen Description TRACHEAL ASPIRATE   Final    Special Requests Normal   Final    Gram Stain     Final    Value: FEW WBC PRESENT, PREDOMINANTLY PMN     RARE SQUAMOUS EPITHELIAL CELLS PRESENT     FEW YEAST   Culture MODERATE CANDIDA ALBICANS   Final    Report Status 08/17/2012 FINAL   Final   BODY FLUID CULTURE     Status: Normal (Preliminary result)   Collection Time   08/17/12 12:19 PM      Component Value Range Status Comment   Specimen Description FLUID RIGHT PLEURAL   Final    Special Requests Normal   Final    Gram Stain     Final    Value: FEW WBC PRESENT,BOTH PMN AND MONONUCLEAR     NO ORGANISMS SEEN    Culture NO GROWTH 1 DAY   Final    Report Status PENDING   Incomplete   CULTURE, ROUTINE-ABSCESS     Status: Normal (Preliminary result)   Collection Time   08/17/12  2:15 PM      Component Value Range Status Comment   Specimen Description ABSCESS ABDOMEN   Final    Special Requests ABD RUQ   Final    Gram Stain     Final    Value: ABUNDANT WBC PRESENT,BOTH PMN AND MONONUCLEAR     FEW SQUAMOUS EPITHELIAL CELLS PRESENT     NO ORGANISMS SEEN   Culture NO GROWTH   Final    Report Status PENDING   Incomplete     Anti-infectives     Start     Dose/Rate Route Frequency Ordered Stop   08/18/12 1845   imipenem-cilastatin (PRIMAXIN) 500 mg in sodium chloride 0.9 % 100 mL IVPB        500 mg 200 mL/hr over 30 Minutes Intravenous 4 times per day 08/18/12 1838     08/17/12 0900   vancomycin (VANCOCIN) IVPB 1000 mg/200 mL premix        1,000 mg 200 mL/hr over 60 Minutes Intravenous Every 24 hours 08/17/12 0730     08/16/12 1200   micafungin (MYCAMINE) 100 mg in sodium chloride 0.9 % 100 mL IVPB        100 mg 100 mL/hr over 1 Hours Intravenous Daily 08/16/12 1146     08/15/12 1300   imipenem-cilastatin (PRIMAXIN) 250 mg in sodium chloride 0.9 % 100 mL IVPB  Status:  Discontinued        250 mg 200 mL/hr over 30 Minutes Intravenous 4 times per day 08/15/12 1119 08/18/12 1838   08/13/12 1400   vancomycin (VANCOCIN) 750 mg in sodium chloride 0.9 % 150 mL IVPB  Status:  Discontinued        750 mg 150 mL/hr over  60 Minutes Intravenous Every 12 hours 08/13/12 1254 08/15/12 1115   08/13/12 1400   imipenem-cilastatin (PRIMAXIN) 500 mg in sodium chloride 0.9 % 100 mL IVPB  Status:  Discontinued        500 mg 200 mL/hr over 30 Minutes Intravenous 3 times per day 08/13/12 1254 08/15/12 1119   08/12/12 1700   levofloxacin (LEVAQUIN) IVPB 750 mg  Status:  Discontinued        750 mg 100 mL/hr over 90 Minutes Intravenous Every 48 hours 07/30/2012 1749 08/12/12 1130   08/12/12 0600   vancomycin  (VANCOCIN) IVPB 1000 mg/200 mL premix  Status:  Discontinued        1,000 mg 200 mL/hr over 60 Minutes Intravenous Every 24 hours 08/11/12 1141 08/12/12 1130   08/19/2012 2200   piperacillin-tazobactam (ZOSYN) IVPB 3.375 g  Status:  Discontinued        3.375 g 12.5 mL/hr over 240 Minutes Intravenous Every 8 hours 08/03/2012 1749 08/12/12 1130   08/08/2012 1800   vancomycin (VANCOCIN) 750 mg in sodium chloride 0.9 % 150 mL IVPB  Status:  Discontinued        750 mg 150 mL/hr over 60 Minutes Intravenous Every 12 hours 08/07/2012 1749 08/11/12 1141   07/30/2012 1530   vancomycin (VANCOCIN) IVPB 1000 mg/200 mL premix  Status:  Discontinued        1,000 mg 200 mL/hr over 60 Minutes Intravenous  Once 08/01/2012 1528 08/28/2012 1808   08/21/2012 1530  piperacillin-tazobactam (ZOSYN) IVPB 3.375 g       3.375 g 100 mL/hr over 30 Minutes Intravenous  Once 08/04/2012 1528 08/28/2012 1630   08/16/2012 1530   levofloxacin (LEVAQUIN) IVPB 750 mg  Status:  Discontinued        750 mg 100 mL/hr over 90 Minutes Intravenous  Once 08/25/2012 1528 08/21/2012 1808          Assessment: 66 yo male noted to have possible HCAP, and pancreatitis with evolving pseudocyst, as well as lower extremity cellulitis. He also has candida albicans growth in the respiratory culture.  He is currently on CVVHDF, and the antibiotics are appropriately dosed for this.   His WBC has increased from 36.9 to 42.7 but he has been afebrile.   11/13 urine culture:negative  11/12 blood culture x 2: negative  11/16 respiratory: moderate candida albicans  11/19 abdomen abscess: NGTD 11/19 pleural fluid: NGTD  Imipenem 11/15>>  Vanc 11/12>>11/14>>11/15>>  Random vanc 11/18: 16.3 mcg/ml  Micafungin 11/18>>  Zosyn 11/12>>11/15  Levaquin 11/12>>11/13   Goal of Therapy:  Vancomycin trough level 15-20 mcg/ml  Plan:  Continue vancomycin 1000 mg IV q24h Continue imipenem 500 mg IV q6h Continue micafungin 100 mg IV q24h Monitor renal function and  duration of CVVHDF  Juanita Craver PharmD Candidate 08/19/2012,7:32 AM  I have reviewed and discussed this patient with Misty Stanley and agree with her assessment and plan. Noted that there are plans for possible perc drainage of the pseudocyst today. Abdominal infection would explain rising WBC. We will follow this patient closely and f/u plans for drain and f/u renal plans.  Abran Duke, PharmD Clinical Pharmacist Phone: [phone] Pager: [phone] 08/19/2012 11:14 AM

## 2012-08-19 NOTE — Progress Notes (Signed)
Subjective: Sedated on the Vent.  Wife and daughter are in room with him.  Objective: Vital signs in last 24 hours: Temp:  [97.2 F (36.2 C)-97.9 F (36.6 C)] 97.9 F (36.6 C) (11/21 0800) Pulse Rate:  [71-97] 71  (11/21 1000) Resp:  [12-33] 23  (11/21 1000) BP: (115-136)/(66-80) 130/78 mmHg (11/21 0820) SpO2:  [98 %-100 %] 100 % (11/21 1000) Arterial Line BP: (79-176)/(60-96) 176/96 mmHg (11/21 1000) FiO2 (%):  [40 %-50 %] 40 % (11/21 1000) Weight:  [194 lb 0.1 oz (88 kg)] 194 lb 0.1 oz (88 kg) (11/21 0500) Last BM Date: 08/15/12  Intake/Output from previous day: 11/20 0701 - 11/21 0700 In: 1475.9 [I.V.:575.9; NG/GT:90; IV Piggyback:810] Out: 4112 [Emesis/NG output:200; Drains:30] Intake/Output this shift: Total I/O In: 434.1 [I.V.:114.1; NG/GT:20; IV Piggyback:300] Out: 658 [Other:658]  General appearance: sedated and unresponsive on the vent. GI: not distended, drain shows a yellow colored fluid,  Extremities: edema +2 in both lower legs and some in upper extremities.  Lab Results:   [facility] 08/19/12 0420 08/18/12 0507  WBC 42.7* 36.9*  HGB 13.1 11.9*  HCT 37.9* 34.7*  PLT 69* 83*    BMET  Basename 08/19/12 0420 08/18/12 1200  NA 136 136  K 5.4* 4.9  CL 100 101  CO2 21 20  GLUCOSE 139* 159*  BUN 45* 49*  CREATININE 1.71* 1.91*  CALCIUM 7.0* 6.7*   PT/INR No results found for this basename: LABPROT:2,INR:2 in the last 72 hours   Lab 08/19/12 0420 08/18/12 0507 08/17/12 1600 08/17/12 0410 08/16/12 1600 08/16/12 0930 08/14/12 0512 08/13/12 0454  AST -- -- -- -- -- 87* 109* 70*  ALT -- -- -- -- -- 34 42 34  ALKPHOS -- -- -- -- -- 207* 187* 173*  BILITOT -- -- -- -- -- 1.8* 1.7* 2.2*  PROT -- -- -- -- -- 5.9* 5.5* 6.0  ALBUMIN 2.2* 2.4* 2.5* 3.5 1.4* -- -- --     Lipase     Component Value Date/Time   LIPASE 2399* 08/17/2012 0410     Studies/Results: Ct Guided Abscess Drain  08/17/2012  *RADIOLOGY REPORT*  Indication: Perihepatic fluid,  elevated white cell count, 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 The patient was placed supine on the CT gantry and a pre procedural CT was performed re-demonstrating the known abscess/fluid collection adjacent to the lateral aspect the right lobe the liver. The procedure was planned.   A timeout was performed prior to the initiation of the procedure  The right lateral abdomen was prepped and draped in the usual sterile fashion.   The overlying soft tissues were anesthetized with 1% lidocaine with epinephrine.  Appropriate trajectory was planned with the use of a 22 gauge spinal needle.  An 18 gauge trocar needle was advanced into the abscess/fluid collection and a short Amplatz super stiff wire was coiled within the collection. Appropriate positioning was confirmed with a limited CT scan.  The tract was serially dilated allowing placement of a 10 [place] all- purpose drainage catheter.  Appropriate positioning was confirmed with a limited postprocedural CT scan.  100 ml of serous, slightly blood tinged fluid was aspirated.  The tube was connected to a drainage bag and sutured in place.  A dressing was placed.  The patient tolerated the procedure well without immediate post procedural complication.  Impression:  Successful CT guided placement of a 10 [place] all purpose drain catheter into the peri hepatic fluid collection with aspiration of 100 mL of serious, slightly blood tinged fluid.  Samples were sent to the laboratory as requested by the ordering clinical team.   Original Report Authenticated By: Tacey Ruiz, MD    Dg Chest [facility] 1 View  08/19/2012  *RADIOLOGY REPORT*  Clinical Data:  Evaluate endotracheal tube position.  PORTABLE CHEST - 1 VIEW  Comparison: Chest x-ray 08/18/2012.  Findings: An endotracheal tube is in place with tip 4.7 cm above the carina.  Tip of nasogastric tube is just distal to the gastroesophageal junction, and side port is in the distal third of the esophagus. There is a left-sided internal jugular central venous catheter with tip terminating in the confluence of the innominate veins. There is a right upper extremity PICC with tip terminating in the the distal superior vena cava.  Moderate - large partially loculated right-sided pleural effusion appears slightly decreased in size with slight improved aeration throughout the right lung, particularly in the right upper lobe.  Small - moderate left pleural effusion is unchanged.  The opacities in the right lung, predominately in the right base, and at the base of the left lung, favored to predominately reflect passive atelectasis (underlying airspace consolidation in the lung bases bilaterally is difficult to exclude). No evidence of pulmonary edema.  Heart size is normal.  Mediastinal contours are unremarkable.  Atherosclerosis in the thoracic aorta.  IMPRESSION: 1.  Support apparatus, as above. Please take note of the position of the nasogastric tube which could be advanced approximately 8 cm for more optimal placement. 2.  Slight decrease in size of moderate - large right-sided partially loculated pleural effusion. 3.  Bibasilar predominant opacities favored to represent atelectasis, however, underlying air space consolidation is difficult to entirely exclude. 3.  Atherosclerosis.   Original Report Authenticated By: Trudie Reed, M.D.    Dg Chest Port 1 View  08/18/2012  *RADIOLOGY REPORT*  Clinical Data: Assess endotracheal tube.  PORTABLE CHEST - 1 VIEW  Comparison: Chest radiograph 08/17/2012.  Findings: Endotracheal tube is in satisfactory position. Nasogastric tube is followed into the stomach with the tip  projecting beyond the inferior boundary of the film.  Right PICC and left IJ central line tips project over the SVC.  Heart size normal.  Large loculated right pleural effusion and right lung air space disease persist.  Increasing left lower lobe air space disease.  IMPRESSION:  1.  Large loculated right pleural effusion and right lung air space disease persist. 2.  Increasing left lower lobe air space disease.   Original Report Authenticated By: Leanna Battles, M.D.    Dg Chest Port 1 View  08/17/2012  *RADIOLOGY REPORT*  Clinical Data: Post thoracentesis  PORTABLE CHEST - 1 VIEW  Comparison: 08/16/2012  Findings: Decrease in right pleural effusion compared with earlier today.  There remains a moderately large right effusion.  No pneumothorax.  There is bibasilar atelectasis.  Endotracheal tube in good position.  Left jugular catheter tip in the SVC.  NG tube enters the stomach.  Right arm PICC tip in the SVC.  IMPRESSION: Decrease in right pleural effusion following thoracentesis.  No pneumothorax.   Original Report Authenticated By: Janeece Riggers, M.D.     Medications:    . albuterol  6 puff Inhalation Q6H  . antiseptic  oral rinse  15 mL Mouth Rinse QID  . chlorhexidine  15 mL Mouth/Throat BID  . heparin subcutaneous  5,000 Units Subcutaneous Q8H  . hydrocortisone sod succinate (SOLU-CORTEF) injection  50 mg Intravenous Q6H  . imipenem-cilastatin  500 mg Intravenous Q6H  . insulin aspart  0-9 Units Subcutaneous Q4H  . ketoconazole  1 application Topical BID  . micafungin [facility]) IV  100 mg Intravenous Daily  . pantoprazole (PROTONIX) IV  40 mg Intravenous Q24H  . sodium chloride  10-40 mL Intracatheter Q12H  . sodium chloride irrigation  5 mL Irrigation Q8H  . vancomycin  1,000 mg Intravenous Q24H  . [DISCONTINUED] imipenem-cilastatin  250 mg Intravenous Q6H  . [DISCONTINUED] sodium chloride  3 mL Intravenous Q12H      . sodium chloride Stopped (08/14/12 1700)  . sodium chloride 10  mL/hr at 08/15/12 1000  . fentaNYL infusion INTRAVENOUS 50 mcg/hr (08/19/12 1100)  . midazolam (VERSED) infusion 2 mg/hr (08/19/12 1100)  . norepinephrine (LEVOPHED) Adult infusion 5 mcg/min (08/19/12 1100)  . dialysis replacement fluid (prismasate) 300 mL/hr at 08/19/12 1024  . dialysis replacement fluid (prismasate) 200 mL/hr at 08/19/12 0021  . dialysate (PRISMASATE) 1,000 mL/hr at 08/19/12 1040  . vasopressin (PITRESSIN) infusion - *FOR SHOCK* 0.03 Units/min (08/19/12 0500)    Assessment/Plan (Hospitalized 11/5-11/9/13 with:Delirium in a patient with history of your alcohol and nicotine abuse, DT's Abdominal pain with partially incarcerated LIH with fat,reduced,  possible sigmoid diverticultis, intraabdominal ascites, chronic calcific pancreatitis, sludge/gallstones in GB)  1.Readmitted 07/30/2012 with dehydration and acute pancreatitis, biliary vs ETOH with enlarging abdominal fluid collections required intubation 08/14/12.  2.CT guided drainage of perihepatic drainage catheter., amalyase 4182 lipase >2000 3.Shock on Pressors 4.VDRF on the Vent 5.ATN on CRRT   Plan:  Lipase and amalase up on abdominal fluid.  WBC still rising.  Will discuss with Dr. Lindie Spruce.  LOS: 9 days    Gary [name] 08/19/2012

## 2012-08-19 NOTE — Progress Notes (Signed)
Nutrition Consult/Follow-up  Intervention:    TPN per pharmacy RD to follow for nutrition care plan  Assessment:   Patient remains intubated on ventilator support: MV: 15.1 Temp: 36.6  RD consult for new TPN.  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  OGT to LIS.  Continues on CVVHD.  CWOCN note reviewed 11/21 -- "previous areas of blistering from generalized edema to BLE have ruptured and evolved into full thickness patchy areas of wounds."  Patient is receiving TPN with Clinimix E 5/15 @ 40 ml/hr.  Lipids, multivitamins, and trace elements to be provided 3 times weekly (MWF) due to national backorder.  Provides 682 kcal and 48 grams protein daily (based on weekly average).  Meets 36% minimum estimated kcal and 41% minimum estimated protein needs.  Diet Order:  NPO  Meds: Scheduled Meds:   . albuterol  6 puff Inhalation Q6H  . antiseptic oral rinse  15 mL Mouth Rinse QID  . chlorhexidine  15 mL Mouth/Throat BID  . hydrocortisone sod succinate (SOLU-CORTEF) injection  50 mg Intravenous Q6H  . imipenem-cilastatin  500 mg Intravenous Q6H  . insulin aspart  0-9 Units Subcutaneous Q4H  . ketoconazole  1 application Topical BID  . linezolid  600 mg Intravenous Q12H  . micafungin (MYCAMINE) IV  100 mg Intravenous Daily  . pantoprazole (PROTONIX) IV  40 mg Intravenous Q24H  . phytonadione (VITAMIN K) IV  10 mg Intravenous Once  . sodium chloride  10-40 mL Intracatheter Q12H  . sodium chloride irrigation  5 mL Irrigation Q8H  . [DISCONTINUED] heparin subcutaneous  5,000 Units Subcutaneous Q8H  . [DISCONTINUED] imipenem-cilastatin  250 mg Intravenous Q6H  . [DISCONTINUED] sodium chloride  3 mL Intravenous Q12H  . [DISCONTINUED] vancomycin  1,000 mg Intravenous Q24H   Continuous Infusions:   . sodium chloride Stopped (08/14/12 1700)  . sodium chloride 10 mL/hr at 08/15/12 1000  . fentaNYL infusion INTRAVENOUS 50 mcg/hr (08/19/12 1144)  . midazolam (VERSED) infusion 2 mg/hr (08/19/12  1100)  . norepinephrine (LEVOPHED) Adult infusion 5 mcg/min (08/19/12 1100)  . dialysis replacement fluid (prismasate) 300 mL/hr at 08/19/12 1024  . dialysis replacement fluid (prismasate) 200 mL/hr at 08/19/12 0021  . dialysate (PRISMASATE) 1,000 mL/hr at 08/19/12 1040  . TPN (CLINIMIX) +/- additives    . vasopressin (PITRESSIN) infusion - *FOR SHOCK* 0.03 Units/min (08/19/12 1304)   PRN Meds:.sodium chloride, albuterol, fentaNYL, heparin, heparin, LORazepam, midazolam, ondansetron, [DISCONTINUED] DOBUTamine, [DISCONTINUED] norepinephrine (LEVOPHED) Adult infusion, [DISCONTINUED] ondansetron (ZOFRAN) IV, [DISCONTINUED] sodium chloride   CMP     Component Value Date/Time   NA 136 08/19/2012 0420   K 5.4* 08/19/2012 0420   CL 100 08/19/2012 0420   CO2 21 08/19/2012 0420   GLUCOSE 139* 08/19/2012 0420   BUN 45* 08/19/2012 0420   CREATININE 1.71* 08/19/2012 0420   CALCIUM 7.0* 08/19/2012 0420   PROT 5.9* 08/16/2012 0930   ALBUMIN 2.2* 08/19/2012 0420   AST 87* 08/16/2012 0930   ALT 34 08/16/2012 0930   ALKPHOS 207* 08/16/2012 0930   BILITOT 1.8* 08/16/2012 0930   GFRNONAA 40* 08/19/2012 0420   GFRAA 46* 08/19/2012 0420    CBG (last 3)   Basename 08/19/12 1130 08/19/12 0738 08/19/12 0408  GLUCAP 149* 131* 96    Lipase  Date Value Range Status  08/17/2012 2399* 11 - 59 U/L Final    Magnesium  Date Value Range Status  08/19/2012 2.6* 1.5 - 2.5 mg/dL Final    Phosphorus  Date Value Range Status  08/19/2012 6.5*  2.3 - 4.6 mg/dL Final     Intake/Output Summary (Last 24 hours) at 08/19/12 1434 Last data filed at 08/19/12 1400  Gross per 24 hour  Intake 1979.86 ml  Output   4283 ml  Net -2303.14 ml    Weight Status:  88 kg (11/21) -- stable  Re-estimated needs:  1900-2000 kcals, 118-130 gm protein  Nutrition Dx:  Inadequate Oral Intake r/t acute on chronic pancreatitis as evidenced by NPO status, ongoing  New Goal:  TPN to meet >90% of estimated protein needs,  maximize energy provision as able during national lipid backorder, currently unmet  Monitor:  TPN prescription, respiratory status, weight, labs, I/O's  Kirkland Hun, RD, LDN Pager #: [phone] After-Hours Pager #: [phone]

## 2012-08-19 NOTE — Progress Notes (Signed)
Subjective: Pt on vent ,sedated; family in room  Objective: Vital signs in last 24 hours: Temp:  [97.2 F (36.2 C)-97.9 F (36.6 C)] 97.6 F (36.4 C) (11/21 1200) Pulse Rate:  [71-97] 82  (11/21 1500) Resp:  [18-32] 25  (11/21 1500) BP: (101-130)/(66-78) 101/66 mmHg (11/21 1208) SpO2:  [98 %-100 %] 100 % (11/21 1535) Arterial Line BP: (79-176)/(60-96) 165/83 mmHg (11/21 1500) FiO2 (%):  [40 %] 40 % (11/21 1535) Weight:  [194 lb 0.1 oz (88 kg)] 194 lb 0.1 oz (88 kg) (11/21 0500) Last BM Date: 08/15/12  Intake/Output from previous day: 11/20 0701 - 11/21 0700 In: 1475.9 [I.V.:575.9; NG/GT:90; IV Piggyback:810] Out: 4112 [Emesis/NG output:200; Drains:30] Intake/Output this shift: Total I/O In: 1137.6 [I.V.:342.6; Other:5; NG/GT:40; IV Piggyback:750] Out: 1958 [Other:1958]  Right perihepatic drain intact,output 30 cc's turbid,yellow fluid; cx's pend; amylase/lipase on fluid elevated  Lab Results:   [facility] 08/19/12 0420 08/18/12 0507  WBC 42.7* 36.9*  HGB 13.1 11.9*  HCT 37.9* 34.7*  PLT 69* 83*   BMET  Basename 08/19/12 1200 08/19/12 0420  NA 133* 136  K 5.5* 5.4*  CL 99 100  CO2 17* 21  GLUCOSE 155* 139*  BUN 47* 45*  CREATININE 1.72* 1.71*  CALCIUM 7.3* 7.0*   PT/INR  Basename 08/19/12 1329  LABPROT 36.3*  INR 3.96*   ABG  Basename 08/19/12 0336 08/18/12 1038  PHART 7.304* 7.385  HCO3 21.1 21.2   Results for orders placed during the hospital encounter of 08/19/2012  CULTURE, BLOOD (ROUTINE X 2)     Status: Normal   Collection Time   08/16/2012  3:41 PM      Component Value Range Status Comment   Specimen Description BLOOD HAND RIGHT   Final    Special Requests BOTTLES DRAWN AEROBIC ONLY 5CC   Final    Culture  Setup Time 08/16/2012 22:36   Final    Culture NO GROWTH 5 DAYS   Final    Report Status 08/16/2012 FINAL   Final   CULTURE, BLOOD (ROUTINE X 2)     Status: Normal   Collection Time   08/25/2012  3:50 PM      Component Value Range Status  Comment   Specimen Description BLOOD HAND LEFT   Final    Special Requests BOTTLES DRAWN AEROBIC ONLY [facility]   Final    Culture  Setup Time 08/07/2012 22:37   Final    Culture NO GROWTH 5 DAYS   Final    Report Status 08/16/2012 FINAL   Final   URINE CULTURE     Status: Normal   Collection Time   08/11/12 12:43 PM      Component Value Range Status Comment   Specimen Description URINE, RANDOM   Final    Special Requests NONE   Final    Culture  Setup Time 08/11/2012 14:45   Final    Colony Count NO GROWTH   Final    Culture NO GROWTH   Final    Report Status 08/12/2012 FINAL   Final   CULTURE, RESPIRATORY     Status: Normal   Collection Time   08/14/12 11:01 PM      Component Value Range Status Comment   Specimen Description TRACHEAL ASPIRATE   Final    Special Requests Normal   Final    Gram Stain     Final    Value: FEW WBC PRESENT, PREDOMINANTLY PMN     RARE SQUAMOUS EPITHELIAL CELLS PRESENT  FEW YEAST   Culture MODERATE CANDIDA ALBICANS   Final    Report Status 08/17/2012 FINAL   Final   BODY FLUID CULTURE     Status: Normal (Preliminary result)   Collection Time   08/17/12 12:19 PM      Component Value Range Status Comment   Specimen Description FLUID RIGHT PLEURAL   Final    Special Requests Normal   Final    Gram Stain     Final    Value: FEW WBC PRESENT,BOTH PMN AND MONONUCLEAR     NO ORGANISMS SEEN   Culture NO GROWTH 2 DAYS   Final    Report Status PENDING   Incomplete   CULTURE, ROUTINE-ABSCESS     Status: Normal (Preliminary result)   Collection Time   08/17/12  2:15 PM      Component Value Range Status Comment   Specimen Description ABSCESS ABDOMEN   Final    Special Requests ABD RUQ   Final    Gram Stain     Final    Value: ABUNDANT WBC PRESENT,BOTH PMN AND MONONUCLEAR     FEW SQUAMOUS EPITHELIAL CELLS PRESENT     NO ORGANISMS SEEN   Culture NO GROWTH 1 DAY   Final    Report Status PENDING   Incomplete     Studies/Results: Dg Chest Port 1  View  08/19/2012  *RADIOLOGY REPORT*  Clinical Data: Evaluate endotracheal tube position.  PORTABLE CHEST - 1 VIEW  Comparison: Chest x-ray 08/18/2012.  Findings: An endotracheal tube is in place with tip 4.7 cm above the carina.  Tip of nasogastric tube is just distal to the gastroesophageal junction, and side port is in the distal third of the esophagus. There is a left-sided internal jugular central venous catheter with tip terminating in the confluence of the innominate veins. There is a right upper extremity PICC with tip terminating in the the distal superior vena cava.  Moderate - large partially loculated right-sided pleural effusion appears slightly decreased in size with slight improved aeration throughout the right lung, particularly in the right upper lobe.  Small - moderate left pleural effusion is unchanged.  The opacities in the right lung, predominately in the right base, and at the base of the left lung, favored to predominately reflect passive atelectasis (underlying airspace consolidation in the lung bases bilaterally is difficult to exclude). No evidence of pulmonary edema.  Heart size is normal.  Mediastinal contours are unremarkable.  Atherosclerosis in the thoracic aorta.  IMPRESSION: 1.  Support apparatus, as above. Please take note of the position of the nasogastric tube which could be advanced approximately 8 cm for more optimal placement. 2.  Slight decrease in size of moderate - large right-sided partially loculated pleural effusion. 3.  Bibasilar predominant opacities favored to represent atelectasis, however, underlying air space consolidation is difficult to entirely exclude. 3.  Atherosclerosis.   Original Report Authenticated By: Trudie Reed, M.D.    Dg Chest Port 1 View  08/18/2012  *RADIOLOGY REPORT*  Clinical Data: Assess endotracheal tube.  PORTABLE CHEST - 1 VIEW  Comparison: Chest radiograph 08/17/2012.  Findings: Endotracheal tube is in satisfactory position.  Nasogastric tube is followed into the stomach with the tip projecting beyond the inferior boundary of the film.  Right PICC and left IJ central line tips project over the SVC.  Heart size normal.  Large loculated right pleural effusion and right lung air space disease persist.  Increasing left lower lobe air space disease.  IMPRESSION:  1.  Large loculated right pleural effusion and right lung air space disease persist. 2.  Increasing left lower lobe air space disease.   Original Report Authenticated By: Leanna Battles, M.D.     Anti-infectives: Anti-infectives     Start     Dose/Rate Route Frequency Ordered Stop   08/19/12 1330   linezolid (ZYVOX) IVPB 600 mg        600 mg 300 mL/hr over 60 Minutes Intravenous Every 12 hours 08/19/12 1309     08/18/12 1845   imipenem-cilastatin (PRIMAXIN) 500 mg in sodium chloride 0.9 % 100 mL IVPB        500 mg 200 mL/hr over 30 Minutes Intravenous 4 times per day 08/18/12 1838     08/17/12 0900   vancomycin (VANCOCIN) IVPB 1000 mg/200 mL premix  Status:  Discontinued        1,000 mg 200 mL/hr over 60 Minutes Intravenous Every 24 hours 08/17/12 0730 08/19/12 1308   08/16/12 1200   micafungin (MYCAMINE) 100 mg in sodium chloride 0.9 % 100 mL IVPB        100 mg 100 mL/hr over 1 Hours Intravenous Daily 08/16/12 1146     08/15/12 1300   imipenem-cilastatin (PRIMAXIN) 250 mg in sodium chloride 0.9 % 100 mL IVPB  Status:  Discontinued        250 mg 200 mL/hr over 30 Minutes Intravenous 4 times per day 08/15/12 1119 08/18/12 1838   08/13/12 1400   vancomycin (VANCOCIN) 750 mg in sodium chloride 0.9 % 150 mL IVPB  Status:  Discontinued        750 mg 150 mL/hr over 60 Minutes Intravenous Every 12 hours 08/13/12 1254 08/15/12 1115   08/13/12 1400   imipenem-cilastatin (PRIMAXIN) 500 mg in sodium chloride 0.9 % 100 mL IVPB  Status:  Discontinued        500 mg 200 mL/hr over 30 Minutes Intravenous 3 times per day 08/13/12 1254 08/15/12 1119   08/12/12 1700    levofloxacin (LEVAQUIN) IVPB 750 mg  Status:  Discontinued        750 mg 100 mL/hr over 90 Minutes Intravenous Every 48 hours 08/15/2012 1749 08/12/12 1130   08/12/12 0600   vancomycin (VANCOCIN) IVPB 1000 mg/200 mL premix  Status:  Discontinued        1,000 mg 200 mL/hr over 60 Minutes Intravenous Every 24 hours 08/11/12 1141 08/12/12 1130   08/27/2012 2200   piperacillin-tazobactam (ZOSYN) IVPB 3.375 g  Status:  Discontinued        3.375 g 12.5 mL/hr over 240 Minutes Intravenous Every 8 hours 08/12/2012 1749 08/12/12 1130   08/03/2012 1800   vancomycin (VANCOCIN) 750 mg in sodium chloride 0.9 % 150 mL IVPB  Status:  Discontinued        750 mg 150 mL/hr over 60 Minutes Intravenous Every 12 hours 08/07/2012 1749 08/11/12 1141   07/30/2012 1530   vancomycin (VANCOCIN) IVPB 1000 mg/200 mL premix  Status:  Discontinued        1,000 mg 200 mL/hr over 60 Minutes Intravenous  Once 08/11/2012 1528 08/21/2012 1808   08/25/2012 1530  piperacillin-tazobactam (ZOSYN) IVPB 3.375 g       3.375 g 100 mL/hr over 30 Minutes Intravenous  Once 07/31/2012 1528 08/06/2012 1630   08/20/2012 1530   levofloxacin (LEVAQUIN) IVPB 750 mg  Status:  Discontinued        750 mg 100 mL/hr over 90 Minutes Intravenous  Once 08/20/2012 1528 08/05/2012 1808  Assessment/Plan: s/p perihepatic fluid/ pseudocyst  drainage 11/19; check final cx's; cont drain flushes; other plans as per CCS/CCM.   LOS: 9 days    Leyah Bocchino,D [facility] 08/19/2012

## 2012-08-20 ENCOUNTER — Inpatient Hospital Stay (HOSPITAL_COMMUNITY): Payer: Medicare Other

## 2012-08-20 DIAGNOSIS — A419 Sepsis, unspecified organism: Secondary | ICD-10-CM

## 2012-08-20 LAB — DIFFERENTIAL
Basophils Absolute: 0 10*3/uL (ref 0.0–0.1)
Eosinophils Absolute: 0 10*3/uL (ref 0.0–0.7)
Lymphocytes Relative: 3 % — ABNORMAL LOW (ref 12–46)
Monocytes Relative: 2 % — ABNORMAL LOW (ref 3–12)
Neutro Abs: 46.6 10*3/uL — ABNORMAL HIGH (ref 1.7–7.7)
Neutrophils Relative %: 95 % — ABNORMAL HIGH (ref 43–77)

## 2012-08-20 LAB — LIPASE, BLOOD: Lipase: 466 U/L — ABNORMAL HIGH (ref 11–59)

## 2012-08-20 LAB — CHOLESTEROL, TOTAL: Cholesterol: 72 mg/dL (ref 0–200)

## 2012-08-20 LAB — APTT: aPTT: 34 seconds (ref 24–37)

## 2012-08-20 LAB — GLUCOSE, CAPILLARY
Glucose-Capillary: 210 mg/dL — ABNORMAL HIGH (ref 70–99)
Glucose-Capillary: 161 mg/dL — ABNORMAL HIGH (ref 70–99)
Glucose-Capillary: 191 mg/dL — ABNORMAL HIGH (ref 70–99)

## 2012-08-20 LAB — BODY FLUID CULTURE: Special Requests: NORMAL

## 2012-08-20 LAB — BLOOD GAS, ARTERIAL
Acid-base deficit: 8 mmol/L — ABNORMAL HIGH (ref 0.0–2.0)
FIO2: 0.4 %
MECHVT: 470 mL
O2 Saturation: 94.5 %
TCO2: 18.7 mmol/L (ref 0–100)
pCO2 arterial: 37.7 mmHg (ref 35.0–45.0)
pO2, Arterial: 82.9 mmHg (ref 80.0–100.0)

## 2012-08-20 LAB — PROTIME-INR
INR: 1.21 (ref 0.00–1.49)
Prothrombin Time: 15.1 seconds (ref 11.6–15.2)

## 2012-08-20 LAB — CBC
HCT: 39.1 % (ref 39.0–52.0)
MCHC: 33.8 g/dL (ref 30.0–36.0)
RDW: 16.5 % — ABNORMAL HIGH (ref 11.5–15.5)
WBC: 49.1 10*3/uL — ABNORMAL HIGH (ref 4.0–10.5)

## 2012-08-20 LAB — COMPREHENSIVE METABOLIC PANEL
ALT: 17 U/L (ref 0–53)
AST: 77 U/L — ABNORMAL HIGH (ref 0–37)
CO2: 17 mEq/L — ABNORMAL LOW (ref 19–32)
Calcium: 7.5 mg/dL — ABNORMAL LOW (ref 8.4–10.5)
GFR calc non Af Amer: 42 mL/min — ABNORMAL LOW (ref >90)
Sodium: 129 mEq/L — ABNORMAL LOW (ref 135–145)
Total Protein: 6.1 g/dL (ref 6.0–8.3)

## 2012-08-20 LAB — CULTURE, ROUTINE-ABSCESS

## 2012-08-20 LAB — TRIGLYCERIDES: Triglycerides: 107 mg/dL (ref <150)

## 2012-08-20 LAB — MAGNESIUM: Magnesium: 2.6 mg/dL — ABNORMAL HIGH (ref 1.5–2.5)

## 2012-08-20 MED ORDER — DEXTROSE 50 % IV SOLN
INTRAVENOUS | Status: AC
  Administered 2012-08-20: 25 mL
  Filled 2012-08-20: qty 50

## 2012-08-20 MED ORDER — TRACE MINERALS CR-CU-F-FE-I-MN-MO-SE-ZN IV SOLN
INTRAVENOUS | Status: AC
  Administered 2012-08-20: 18:00:00 via INTRAVENOUS
  Filled 2012-08-20: qty 2000

## 2012-08-20 MED ORDER — FAT EMULSION 20 % IV EMUL
250.0000 mL | INTRAVENOUS | Status: AC
  Administered 2012-08-20: 250 mL via INTRAVENOUS
  Filled 2012-08-20: qty 250

## 2012-08-20 MED ORDER — HEPARIN SODIUM (PORCINE) 1000 UNIT/ML DIALYSIS
1000.0000 [IU] | INTRAMUSCULAR | Status: DC | PRN
  Administered 2012-08-20: 3000 [IU] via INTRAVENOUS_CENTRAL
  Filled 2012-08-20: qty 6

## 2012-08-20 MED ORDER — IOHEXOL 300 MG/ML  SOLN
20.0000 mL | INTRAMUSCULAR | Status: AC
  Administered 2012-08-20 (×2): 20 mL via ORAL

## 2012-08-20 MED ORDER — STERILE WATER FOR INJECTION IV SOLN
INTRAVENOUS | Status: DC
  Administered 2012-08-20 – 2012-08-22 (×10): via INTRAVENOUS_CENTRAL
  Filled 2012-08-20 (×16): qty 150

## 2012-08-20 MED ORDER — STERILE WATER FOR INJECTION IV SOLN
INTRAVENOUS | Status: DC
  Administered 2012-08-20 – 2012-08-22 (×19): via INTRAVENOUS_CENTRAL
  Filled 2012-08-20 (×32): qty 150

## 2012-08-20 MED ORDER — VITAMIN K1 10 MG/ML IJ SOLN
10.0000 mg | Freq: Once | INTRAVENOUS | Status: AC
  Administered 2012-08-20: 10 mg via INTRAVENOUS
  Filled 2012-08-20: qty 1

## 2012-08-20 NOTE — Progress Notes (Signed)
Wasted with [name],RN 25cc of Versed in sink.Jauan [name]

## 2012-08-20 NOTE — Progress Notes (Signed)
[name] Progress Note    Subjective: Patient continues to look septic in spite of percutaneous drain.  WBC up to 49K!!  Objective: Vital signs in last 24 hours: Temp:  [97.4 F (36.3 C)-97.7 F (36.5 C)] 97.5 F (36.4 C) (11/22 0800) Pulse Rate:  [71-95] 85  (11/22 0902) Resp:  [18-31] 30  (11/22 0902) BP: (93-121)/(63-73) 102/72 mmHg (11/22 0902) SpO2:  [98 %-100 %] 100 % (11/22 0902) Arterial Line BP: (70-176)/(52-96) 114/70 mmHg (11/22 0800) FiO2 (%):  [40 %] 40 % (11/22 0902) Weight:  [83.7 kg (184 lb 8.4 oz)] 83.7 kg (184 lb 8.4 oz) (11/22 0432) Last BM Date: 08/15/12  Intake/Output from previous day: 11/21 0701 - 11/22 0700 In: 2683.4 [I.V.:773.4; NG/GT:60; IV Piggyback:1360; TPN:480] Out: 8295 [Urine:5; Drains:25] Intake/Output this shift: Total I/O In: 54.8 [I.V.:14.8; TPN:40] Out: 143 [Other:143]  General: Not responsive.  On CVVHD  Lungs: CXR shows a large right effusion. Better than CXR yesterday.  Decreased breath sounds on the right  Abd: Fir, no tense, no bowel sounds.  Perc drain has not put out much, but apparently it has bee draining a lot around the tube.  Extremities: No changes  Neuro:  Unresponsive.  Lab Results:  @LABLAST2 (wbc:2,hgb:2,hct:2,plt:2) BMET  Basename 08/20/12 0439 08/19/12 1600  NA 129* 133*  K 5.1 5.5*  CL 97 99  CO2 17* 18*  GLUCOSE 223* 190*  BUN 46* 45*  CREATININE 1.64* 1.62*  CALCIUM 7.5* 7.3*   PT/INR  Basename 08/19/12 1329  LABPROT 36.3*  INR 3.96*   ABG  Basename 08/19/12 0336 08/18/12 1038  PHART 7.304* 7.385  HCO3 21.1 21.2    Studies/Results: Dg Chest Port 1 View  08/20/2012  *RADIOLOGY REPORT*  Clinical Data: Intubation.  Pneumonia.  Hypertension.  PORTABLE CHEST - 1 VIEW  Comparison: 08/19/2012  Findings: Pleural fluid obscures right hemidiaphragm and extends along the periphery of the right lung.  There is coarse reticular type opacity in the right lower lung consistent with atelectasis.  Infiltrate is possible.  These findings are stable.  The left lung is clear.  The heart is normal in size.  No pneumothorax.  The endotracheal tube, nasogastric tube, left internal jugular dual lumen central venous catheter and right PICC are stable from the previous day's exam.  IMPRESSION: Right pleural effusion with right lower lung zone atelectasis or infiltrate.  Support apparatus as described.  No change from previous day's study.   Original Report Authenticated By: Amie Portland, M.D.    Dg Chest Port 1 View  08/19/2012  *RADIOLOGY REPORT*  Clinical Data: Evaluate endotracheal tube position.  PORTABLE CHEST - 1 VIEW  Comparison: Chest x-ray 08/18/2012.  Findings: An endotracheal tube is in place with tip 4.7 cm above the carina.  Tip of nasogastric tube is just distal to the gastroesophageal junction, and side port is in the distal third of the esophagus. There is a left-sided internal jugular central venous catheter with tip terminating in the confluence of the innominate veins. There is a right upper extremity PICC with tip terminating in the the distal superior vena cava.  Moderate - large partially loculated right-sided pleural effusion appears slightly decreased in size with slight improved aeration throughout the right lung, particularly in the right upper lobe.  Small - moderate left pleural effusion is unchanged.  The opacities in the right lung, predominately in the right base, and at the base of the left lung, favored to predominately reflect passive atelectasis (underlying airspace consolidation in the lung bases  bilaterally is difficult to exclude). No evidence of pulmonary edema.  Heart size is normal.  Mediastinal contours are unremarkable.  Atherosclerosis in the thoracic aorta.  IMPRESSION: 1.  Support apparatus, as above. Please take note of the position of the nasogastric tube which could be advanced approximately 8 cm for more optimal placement. 2.  Slight decrease in size of moderate -  large right-sided partially loculated pleural effusion. 3.  Bibasilar predominant opacities favored to represent atelectasis, however, underlying air space consolidation is difficult to entirely exclude. 3.  Atherosclerosis.   Original Report Authenticated By: Trudie Reed, M.D.     Anti-infectives: Anti-infectives     Start     Dose/Rate Route Frequency Ordered Stop   08/19/12 1330   linezolid (ZYVOX) IVPB 600 mg        600 mg 300 mL/hr over 60 Minutes Intravenous Every 12 hours 08/19/12 1309     08/18/12 1845   imipenem-cilastatin (PRIMAXIN) 500 mg in sodium chloride 0.9 % 100 mL IVPB        500 mg 200 mL/hr over 30 Minutes Intravenous 4 times per day 08/18/12 1838     08/17/12 0900   vancomycin (VANCOCIN) IVPB 1000 mg/200 mL premix  Status:  Discontinued        1,000 mg 200 mL/hr over 60 Minutes Intravenous Every 24 hours 08/17/12 0730 08/19/12 1308   08/16/12 1200   micafungin (MYCAMINE) 100 mg in sodium chloride 0.9 % 100 mL IVPB        100 mg 100 mL/hr over 1 Hours Intravenous Daily 08/16/12 1146     08/15/12 1300   imipenem-cilastatin (PRIMAXIN) 250 mg in sodium chloride 0.9 % 100 mL IVPB  Status:  Discontinued        250 mg 200 mL/hr over 30 Minutes Intravenous 4 times per day 08/15/12 1119 08/18/12 1838   08/13/12 1400   vancomycin (VANCOCIN) 750 mg in sodium chloride 0.9 % 150 mL IVPB  Status:  Discontinued        750 mg 150 mL/hr over 60 Minutes Intravenous Every 12 hours 08/13/12 1254 08/15/12 1115   08/13/12 1400   imipenem-cilastatin (PRIMAXIN) 500 mg in sodium chloride 0.9 % 100 mL IVPB  Status:  Discontinued        500 mg 200 mL/hr over 30 Minutes Intravenous 3 times per day 08/13/12 1254 08/15/12 1119   08/12/12 1700   levofloxacin (LEVAQUIN) IVPB 750 mg  Status:  Discontinued        750 mg 100 mL/hr over 90 Minutes Intravenous Every 48 hours Sep 01, 2012 1749 08/12/12 1130   08/12/12 0600   vancomycin (VANCOCIN) IVPB 1000 mg/200 mL premix  Status:  Discontinued         1,000 mg 200 mL/hr over 60 Minutes Intravenous Every 24 hours 08/11/12 1141 08/12/12 1130   01-Sep-2012 2200   piperacillin-tazobactam (ZOSYN) IVPB 3.375 g  Status:  Discontinued        3.375 g 12.5 mL/hr over 240 Minutes Intravenous Every 8 hours 09-01-12 1749 08/12/12 1130   September 01, 2012 1800   vancomycin (VANCOCIN) 750 mg in sodium chloride 0.9 % 150 mL IVPB  Status:  Discontinued        750 mg 150 mL/hr over 60 Minutes Intravenous Every 12 hours 2012-09-01 1749 08/11/12 1141   September 01, 2012 1530   vancomycin (VANCOCIN) IVPB 1000 mg/200 mL premix  Status:  Discontinued        1,000 mg 200 mL/hr over 60 Minutes Intravenous  Once 09-01-12  1528 08/05/2012 1808   08/14/2012 1530  piperacillin-tazobactam (ZOSYN) IVPB 3.375 g       3.375 g 100 mL/hr over 30 Minutes Intravenous  Once 08/04/2012 1528 08/28/2012 1630   08/19/2012 1530   levofloxacin (LEVAQUIN) IVPB 750 mg  Status:  Discontinued        750 mg 100 mL/hr over 90 Minutes Intravenous  Once 08/19/2012 1528 08/21/2012 1808          Assessment/Plan: s/p  Plan to get another CT abdomen today.  Last one was about one week ago.  LOS: 10 days   Marta Lamas. Gae Bon, MD, FACS [phone] [facility] 08/20/2012

## 2012-08-20 NOTE — Progress Notes (Signed)
Patient ID: Gary [name], male   DOB: 10/11/1945, 66 y.o.   MRN: [id number]   [facility] Progress Note    Subjective:   No acute events overnight, CRRT/ultrafiltration continues without problems. Pressor needs noted.    Objective:   BP 121/73  Pulse 86  Temp 97.5 F (36.4 C) (Oral)  Resp 28  Ht 5\' 4"  (1.626 m)  Wt 83.7 kg (184 lb 8.4 oz)  BMI 31.67 kg/m2  SpO2 100%  Intake/Output Summary (Last 24 hours) at 08/20/12 0827 Last data filed at 08/20/12 0700  Gross per 24 hour  Intake 2617.5 ml  Output   4937 ml  Net -2319.5 ml   Weight change: -4.3 kg (-9 lb 7.7 oz)  Physical Exam: Gen: Intubated, sedated CVS: Pulse regular in rate and rhythm, heart sounds S1 and S2 normal Resp: Diminished breath sounds right base otherwise clear to auscultation, no rales Abd: Soft, flat, perihepatic drain in situ Ext: 2+ lower extremity edema-one plus lower extremity edema  Imaging: Dg Chest Port 1 View  08/19/2012  *RADIOLOGY REPORT*  Clinical Data: Evaluate endotracheal tube position.  PORTABLE CHEST - 1 VIEW  Comparison: Chest x-ray 08/18/2012.  Findings: An endotracheal tube is in place with tip 4.7 cm above the carina.  Tip of nasogastric tube is just distal to the gastroesophageal junction, and side port is in the distal third of the esophagus. There is a left-sided internal jugular central venous catheter with tip terminating in the confluence of the innominate veins. There is a right upper extremity PICC with tip terminating in the the distal superior vena cava.  Moderate - large partially loculated right-sided pleural effusion appears slightly decreased in size with slight improved aeration throughout the right lung, particularly in the right upper lobe.  Small - moderate left pleural effusion is unchanged.  The opacities in the right lung, predominately in the right base, and at the base of the left lung, favored to predominately reflect passive atelectasis (underlying  airspace consolidation in the lung bases bilaterally is difficult to exclude). No evidence of pulmonary edema.  Heart size is normal.  Mediastinal contours are unremarkable.  Atherosclerosis in the thoracic aorta.  IMPRESSION: 1.  Support apparatus, as above. Please take note of the position of the nasogastric tube which could be advanced approximately 8 cm for more optimal placement. 2.  Slight decrease in size of moderate - large right-sided partially loculated pleural effusion. 3.  Bibasilar predominant opacities favored to represent atelectasis, however, underlying air space consolidation is difficult to entirely exclude. 3.  Atherosclerosis.   Original Report Authenticated By: Trudie Reed, M.D.     Labs: BMET  Lab 08/20/12 0439 08/19/12 1600 08/19/12 1200 08/19/12 0420 08/18/12 1200 08/18/12 0507 08/17/12 1600 08/17/12 0410 08/16/12 1600  NA 129* 133* 133* 136 136 137 135 -- --  K 5.1 5.5* 5.5* 5.4* 4.9 5.3* 4.6 -- --  CL 97 99 99 100 101 101 100 -- --  CO2 17* 18* 17* 21 20 21 22  -- --  GLUCOSE 223* 190* 155* 139* 159* 164* 172* -- --  BUN 46* 45* 47* 45* 49* 52* 57* -- --  CREATININE 1.64* 1.62* 1.72* 1.71* 1.91* 2.00* 2.17* -- --  ALB -- -- -- -- -- -- -- -- --  CALCIUM 7.5* 7.3* 7.3* 7.0* 6.7* 6.8* 6.7* -- --  PHOS 7.5* 7.1* -- 6.5* -- 6.0* 6.4* 5.9* 5.6*   CBC  Lab 08/20/12 0439 08/19/12 0420 08/18/12 0507 08/17/12 0411  WBC  49.1* 42.7* 36.9* 29.7*  NEUTROABS 46.6* -- -- 27.6*  HGB 13.2 13.1 11.9* 11.3*  HCT 39.1 37.9* 34.7* 32.6*  MCV 103.2* 102.4* 100.3* 101.6*  PLT 64* 69* 83* 112*    Medications:      . albuterol  6 puff Inhalation Q6H  . antiseptic oral rinse  15 mL Mouth Rinse QID  . chlorhexidine  15 mL Mouth/Throat BID  . hydrocortisone sod succinate (SOLU-CORTEF) injection  50 mg Intravenous Q6H  . imipenem-cilastatin  500 mg Intravenous Q6H  . insulin aspart  0-9 Units Subcutaneous Q4H  . ketoconazole  1 application Topical BID  . linezolid  600 mg  Intravenous Q12H  . micafungin (MYCAMINE) IV  100 mg Intravenous Daily  . pantoprazole (PROTONIX) IV  40 mg Intravenous Q24H  . [COMPLETED] phytonadione (VITAMIN K) IV  10 mg Intravenous Once  . phytonadione (VITAMIN K) IV  10 mg Intravenous Once  . sodium chloride  10-40 mL Intracatheter Q12H  . sodium chloride irrigation  5 mL Irrigation Q8H  . [DISCONTINUED] heparin subcutaneous  5,000 Units Subcutaneous Q8H  . [DISCONTINUED] vancomycin  1,000 mg Intravenous Q24H     Assessment/ Plan:   1. ARF, oliguric- most consistent with ischemic ATN with the differentials of AIN/intra-abdominal hypertension. Given suboptimal clearance, increased dialysate flow rate to 1.5 L per hour, switch pre-and post filter fluids to isotonic bicarbonate given persistent acidosis/hyperkalemia. Suspect that ongoing tissue necrosis contributing to hyperkalemia spikes. He is still able to tolerate net removal of 100 cc an hour, this will be continued for now (previous attempts at increasing this rate have led to hypotension/need for increased pressors).  2. VDRF- per PCCM, will continue gentle UF for pulmonary unloading and facilitating wean  3. SIRS secondary to pancreatitis/incarcerated abdominal hernia- on low dose pressors and CRRT support as above, on multiple pressors. Off BSABx. Ongoing close evaluation by surgery. 4. Pleural Effusion- S/P thoracentesis- studies pending  5. Ascites with H/O cirrhosis-   Zetta Bills, MD 08/20/2012, 8:27 AM

## 2012-08-20 NOTE — Progress Notes (Signed)
Subjective: Sedated on vent, remains on pressors.   Objective: Physical Exam: BP 121/73  Pulse 86  Temp 97.5 F (36.4 C) (Oral)  Resp 28  Ht 5\' 4"  (1.626 m)  Wt 184 lb 8.4 oz (83.7 kg)  BMI 31.67 kg/m2  SpO2 100% Generalized anasarca with abd mild distended Leaking ascites/edema around drain site, not much output into drain itself.   Labs: CBC  Basename 08/20/12 0439 08/19/12 0420  WBC 49.1* 42.7*  HGB 13.2 13.1  HCT 39.1 37.9*  PLT 64* 69*   BMET  Basename 08/20/12 0439 08/19/12 1600  NA 129* 133*  K 5.1 5.5*  CL 97 99  CO2 17* 18*  GLUCOSE 223* 190*  BUN 46* 45*  CREATININE 1.64* 1.62*  CALCIUM 7.5* 7.3*   LFT  Basename 08/20/12 0439  PROT 6.1  ALBUMIN 1.9*  AST 77*  ALT 17  ALKPHOS 216*  BILITOT 3.5*  BILIDIR --  IBILI --  LIPASE --   PT/INR  Basename 08/19/12 1329  LABPROT 36.3*  INR 3.96*     Studies/Results: Dg Chest Port 1 View  08/19/2012  *RADIOLOGY REPORT*  Clinical Data: Evaluate endotracheal tube position.  PORTABLE CHEST - 1 VIEW  Comparison: Chest x-ray 08/18/2012.  Findings: An endotracheal tube is in place with tip 4.7 cm above the carina.  Tip of nasogastric tube is just distal to the gastroesophageal junction, and side port is in the distal third of the esophagus. There is a left-sided internal jugular central venous catheter with tip terminating in the confluence of the innominate veins. There is a right upper extremity PICC with tip terminating in the the distal superior vena cava.  Moderate - large partially loculated right-sided pleural effusion appears slightly decreased in size with slight improved aeration throughout the right lung, particularly in the right upper lobe.  Small - moderate left pleural effusion is unchanged.  The opacities in the right lung, predominately in the right base, and at the base of the left lung, favored to predominately reflect passive atelectasis (underlying airspace consolidation in the lung bases  bilaterally is difficult to exclude). No evidence of pulmonary edema.  Heart size is normal.  Mediastinal contours are unremarkable.  Atherosclerosis in the thoracic aorta.  IMPRESSION: 1.  Support apparatus, as above. Please take note of the position of the nasogastric tube which could be advanced approximately 8 cm for more optimal placement. 2.  Slight decrease in size of moderate - large right-sided partially loculated pleural effusion. 3.  Bibasilar predominant opacities favored to represent atelectasis, however, underlying air space consolidation is difficult to entirely exclude. 3.  Atherosclerosis.   Original Report Authenticated By: Trudie Reed, M.D.     Assessment/Plan: s/p perihepatic fluid/pseudocyst drainage 11/19. WBC rising, clinically deteriorating Repeat CT abd ordered for today   LOS: 10 days    Brayton El PA-C 08/20/2012 8:32 AM

## 2012-08-20 NOTE — Progress Notes (Signed)
PULMONARY  / CRITICAL CARE MEDICINE  Name: Gary [name] MRN: [id number] DOB: 1945-11-18    LOS: 10  REFERRING MD :  [name]  CHIEF COMPLAINT: encephalopathy, respiratory insufficiency  BRIEF PATIENT DESCRIPTION:  66 year old white male admitted 11/12 for abdominal pain in the setting of acute pancreatitis an incarcerated left inguinal hernia. Initially admitted to medicine service. Critical care asked to see on 11/15 for encephalopathy, and concern for progressive respiratory distress and inability to protect airway.  LINES / TUBES: RUE PICC line 11/15>>> L IJ HD Cath 11/17>>> R Rad Aline 11/16>>>  CULTURES: 11/16 Resp >>>CANDIDA ALBICANS 11/13 urine culture: Negative 11/12 blood culture x2>>>Negative 11/21 BC>>>  ANTIBIOTICS: Levofloxacin 11/14>>>11/14>1 dose. Zosyn 11/12>>>11/14 Primaxin 11/15>>> Vancomycin 11/12>>>11/14>>> 11/15>>>11/20 Micafungin 11/18>>> Linezolid 11/20 -->  SIGNIFICANT EVENTS:  CT abdomen 11/12: Enlargement of. Pancreatic fluid collection with significant mass effect on both right and left lobe of liver. Suggesting enlarging pseudocysts. Nonspecific nodularity edema of the mesentery with no focal mass. Stable left inguinal hernia. Abdominal ultrasound 11/12: gallstone with echogenic sludge, but no evidence of biliary obstruction or acute cholecystitis. Increased ascites compared to CT 7 days prior fluid around the liver with mass effect. ECHO 11/12: Systolic function was normal. The estimated ejection fraction was in the range of 50% to 55%. There was an increased relative contribution of atrial contraction to ventricular filling.  Bilateral LE ultrasound on11/15>>>>no DVT Bedside Abd U/S: no pockets of fluid, minimal ascites   LEVEL OF CARE:  ICU PRIMARY SERVICE:  PCCM CONSULTANTS:  IR, Gen Surg, nephro CODE STATUS full code DIET:  TPN DVT Px:   GI Px: Protonix  VITAL SIGNS: Temp:  [97.4 F (36.3 C)-97.9 F (36.6 C)] 97.4 F (36.3 C) (11/22  0400) Pulse Rate:  [71-95] 86  (11/22 0700) Resp:  [18-31] 28  (11/22 0700) BP: (93-130)/(63-78) 121/73 mmHg (11/22 0438) SpO2:  [98 %-100 %] 100 % (11/22 0700) Arterial Line BP: (70-176)/(52-96) 114/71 mmHg (11/22 0700) FiO2 (%):  [40 %] 40 % (11/22 0700) Weight:  [184 lb 8.4 oz (83.7 kg)] 184 lb 8.4 oz (83.7 kg) (11/22 0432)  HEMODYNAMICS: CVP:  [8 mmHg-14 mmHg] 8 mmHg  VENTILATOR SETTINGS: Vent Mode:  [-] PRVC FiO2 (%):  [40 %] 40 % Set Rate:  [30 bmp] 30 bmp Vt Set:  [470 mL] 470 mL PEEP:  [5 cmH20] 5 cmH20 Plateau Pressure:  [16 cmH20-26 cmH20] 22 cmH20  INTAKE / OUTPUT: Intake/Output      11/21 0701 - 11/22 0700 11/22 0701 - 11/23 0700   I.V. (mL/kg) 773.4 (9.2)    Other 10    NG/GT 60    IV Piggyback 1360    TPN 480    Total Intake(mL/kg) 2683.4 (32.1)    Urine (mL/kg/hr) 5 (0)    Emesis/NG output     Drains 25    Other 5039    Total Output 5069    Net -2385.6          PHYSICAL EXAMINATION: General:  Chronically ill-appearing 66 year old male appears older than stated age.  Neuro:  Sedated and intubated.  Withdraws from pain in all 4,  HEENT:  PERRL, no JVD. Cardiovascular:  No murmur rub or gallop. RRR Lungs: minimal scattered Rhonchi, no wheezes, or rales Abdomen:  Mildly Distended, firm, generalized discomfort to palpation. Hypoactive bowel sounds. Musculoskeletal:  3+ pitting edema in all 4 extremities.  Withdraws to pain.  Skin:  Lower extremities are both swollen and edematous with blisters noted,  Erythema around the  ankles. Cool to touch, withdraws in pain with gentle palpation bilaterally, cap refill 6 seconds bilaterally.  Pulses difficult to doppler with bandages and edema.   LABS:  Lab 08/20/12 0439 08/19/12 1600 08/19/12 1329 08/19/12 1200 08/19/12 0420 08/19/12 0336 08/18/12 1038 08/18/12 0507 08/18/12 0427 08/16/12 0930 08/15/12 1146 08/15/12 0355 08/14/12 0512 08/13/12 1200 08/13/12 1130  HGB 13.2 -- -- -- 13.1 -- -- 11.9* -- -- -- -- -- -- --    WBC 49.1* -- -- -- 42.7* -- -- 36.9* -- -- -- -- -- -- --  PLT 64* -- -- -- 69* -- -- 83* -- -- -- -- -- -- --  NA 129* 133* -- 133* -- -- -- -- -- -- -- -- -- -- --  K 5.1 5.5* -- -- -- -- -- -- -- -- -- -- -- -- --  CL 97 99 -- 99 -- -- -- -- -- -- -- -- -- -- --  CO2 17* 18* -- 17* -- -- -- -- -- -- -- -- -- -- --  GLUCOSE 223* 190* -- 155* -- -- -- -- -- -- -- -- -- -- --  BUN 46* 45* -- 47* -- -- -- -- -- -- -- -- -- -- --  CREATININE 1.64* 1.62* -- 1.72* -- -- -- -- -- -- -- -- -- -- --  CALCIUM 7.5* 7.3* -- 7.3* -- -- -- -- -- -- -- -- -- -- --  MG 2.6* -- -- -- 2.6* -- -- 2.5 -- -- -- -- -- -- --  PHOS 7.5* 7.1* -- -- 6.5* -- -- -- -- -- -- -- -- -- --  AST 77* -- -- -- -- -- -- -- -- 87* -- -- 109* -- --  ALT 17 -- -- -- -- -- -- -- -- 34 -- -- 42 -- --  ALKPHOS 216* -- -- -- -- -- -- -- -- 207* -- -- 187* -- --  BILITOT 3.5* -- -- -- -- -- -- -- -- 1.8* -- -- 1.7* -- --  PROT 6.1 -- -- -- -- -- -- -- -- 5.9* -- -- 5.5* -- --  ALBUMIN 1.9* 1.9* -- -- 2.2* -- -- -- -- -- -- -- -- -- --  APTT -- -- 51* -- -- -- -- -- -- -- 34 -- -- -- --  INR -- -- 3.96* -- -- -- -- -- -- -- 1.35 -- -- 1.72* --  LATICACIDVEN -- -- -- -- -- -- -- -- -- -- -- -- 2.9* -- --  TROPONINI -- -- -- -- -- -- -- -- -- -- -- -- -- -- --  PROCALCITON -- -- -- -- -- -- -- -- -- -- -- 5.71 4.36 -- 3.86  PROBNP -- -- -- -- -- -- -- -- -- -- -- -- -- -- --  O2SATVEN -- -- -- -- -- -- -- -- -- -- -- -- -- -- --  PHART -- -- -- -- -- 7.304* 7.385 -- 7.377 -- -- -- -- -- --  PCO2ART -- -- -- -- -- 42.6 35.4 -- 35.8 -- -- -- -- -- --  PO2ART -- -- -- -- -- 71.0* 90.0 -- 87.4 -- -- -- -- -- --    Lab 08/20/12 0415 08/19/12 2321 08/19/12 1954 08/19/12 1610 08/19/12 1130  GLUCAP 194* 207* 84 178* 149*   Lipase     Component Value Date/Time   LIPASE 2399* 08/17/2012 0410    Intake/Output Summary (Last 24 hours)  at 08/20/12 0738 Last data filed at 08/20/12 0700  Gross per 24 hour  Intake 2683.4 ml  Output    5069 ml  Net -2385.6 ml   IMAGING: PCXR: 11/21>>>Slight decrease in size of rightloculated pleural effusion. Bibasilar predominant opacities favored to represent atelectasis  ECG: ST  DIAGNOSES: Principal Problem:  *Pancreatitis, alcoholic, acute Active Problems:  HTN (hypertension)  Alcohol abuse  Tobacco abuse  HCAP (healthcare-associated pneumonia)  ARF (acute renal failure)  Hyperkalemia  Hyponatremia  Hernia, inguinal  Cholelithiases  Leukocytosis  Pancreatic pseudocyst/cyst  Lower extremity pain, bilateral  Candidal dermatitis  Dehydration  Acute respiratory failure with hypoxia  Alcoholic liver damage  Nonspecific elevation of levels of transaminase or lactic acid dehydrogenase (LDH)  ASSESSMENT / PLAN:  PULMONARY  ASSESSMENT: Acute resp failure Large right pleural effusion Bibasilar right greater than left atelectasis Mildly more acidotic ABG today.  CXR pending for 11/22  PLAN:   ABG this am , re eval MV needs No SBT Iplat tolerated TV to 8 cc/kg, abg awaited MV too high to wean pcxr this am awaited  CARDIOVASCULAR  ASSESSMENT:  Systemic inflammatory response syndrome/Sirs and sepsis: in the setting of pancreatitis with evolving pseudocyst. potential sources: being infected pseudocyst, lower extremity cellulitis. Also possible aspiration versus hospital associated pneumonia. Vaso and Levo requirements stable from 11/21.  Lower extremity edema: Bilateral likely reflects cellulitis.  LE venous doppler on 11/15 negative for DVT.   PLAN:  Continue Norepi and vaso for BP support with negative fluid balance CVVHD removal negative 100 ml/hr, negative 2.3L on 11/21 CVPs q4 hours. Keep vasopressin and titrate Levo as possible.  Maintain stress steroids Treat source infection?  RENAL  ASSESSMENT:   Acute renal failure Probably multi-factorial but hepato-renal syndrome seems to be most likely.   Metabolic acidosis: improved with CVVHD at negative 100  ml/hr.  PLAN:   Follow BMP CVVHD per renal CVP Q4h KVO IVF  Renal dose medications. Renal following.  GASTROINTESTINAL  ASSESSMENT:   Acute pancreatitis with pseudocyst: still in marked abdominal discomfort, progressive ascites, worsening abdominal distention. Lipase level pending 11/22  Incarcerated left inguinal hernia: surgery planned for future, but currently elective Cholelithiasis: no evidence of cholecystitis Abnormal LFTs in the setting of alcoholic hepatitis, AST and ALT trending downward.  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  Follow LFTs GI PX ordered Drain placed in IR, amylase/lipase positive, cultures NGTD  Pre iCU nutritional status poor, lip at near 2500 with recheck today pending. , TPN started 11/20 See ID, consider CT abdo  HEMATOLOGIC  ASSESSMENT: Hemodilution related anemia: no current evidence of blood loss Coagulopathy: Mild, probably related to alcohol hepatitis Thrombocytopenia: stable today.  Heparin stopped with CVVHD and Proph doses.   Rising WBC 29.7 --> 36.9 --> 42.7 --> 49.1 today.  Auto anticoagulation PLAN:  HIT panel in process, stop heparin, suspicion moderate PT INR 3.96, PTT 51.  Will send Factor 8 level today.  Transfuse for hemoglobin less than 7, currently 13.2.  Vit K x 1 Coags in am   INFECTIOUS  ASSESSMENT:   Sirs/sepsis: Multiple potential sources as follows: Infected pseudocyst, SBP, HCAP vs aspiration pneumonia and lower extremity cellulitis. Possible candida dermatitis both lower extremities, [place] negative Rising wbc, worsening clinical status, Abd [place] yesterday showed little ascities and no pocket of fluid.  UOP still minimal so no UA ordered. Lines appear clean.  PLAN:   Continue Imipenem, mycofungin,  ensure dose adjusted liver dz Source control? Drain is in pseudocysts linazolid, at risk VRE and lack of progress,  follow plat closely Repeat BC pending  UA if urine noted If pos BC, change line CT abdo/pelvis today oral contrast only Add chest ensure not loculated  ENDOCRINE  ASSESSMENT:   Hyperglycemia on Stress steroids.   PLAN:   Cont CBG SSI as needed  NEUROLOGIC  ASSESSMENT:   Acute encephalopathy: Likely in the setting of sepsis. Additionally concerned about alcohol withdrawal  PLAN:   PRN sedation only very minimal sedation currently.  Avoid benzo WUA  CLINICAL SUMMARY:  Remain on pressors, added linazolid 11/20. Repeat BC, check lipase today, consider repeat imaging of the abd for possible abscess formation? Discussion of code status with family in light of worsening clinical status.   PRIBULA,CHRISTOPHER, MD Internal Medicine Resident, PGY III Pager: [phone] 08/20/2012 8:06 AM   I have fully examined this patient and agree with above findings.    And edited in full Personal time 30 min   Mcarthur Rossetti. Tyson Alias, MD, FACP Pgr: [phone] [facility]

## 2012-08-20 NOTE — Progress Notes (Signed)
Brief Nutrition Note:   RD pulled to chart for pt the possible drug nutrient interaction. Pt currently on Zyvox (MAOI), this medications can interact with tyramine containing foods. Pt currently on TPN. RD will monitor for diet advancement and provide drug nutrient interaction education as needed.  No interventions for this warranted at this time.   RD will continue to follow for TPN/Vent needs.    Clarene Duke RD, LDN Pager [phone] After Hours pager [phone]

## 2012-08-20 NOTE — Progress Notes (Signed)
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  No Known Allergies  Patient Measurements: Height: 5\' 4"  (162.6 cm) Weight: 184 lb 8.4 oz (83.7 kg) IBW/kg (Calculated) : 59.2   Vital Signs: Temp: 97.5 F (36.4 C) (11/22 0800) Temp src: Oral (11/22 0800) BP: 121/73 mmHg (11/22 0438) Pulse Rate: 86  (11/22 0700) Intake/Output from previous day: 11/21 0701 - 11/22 0700 In: 2683.4 [I.V.:773.4; NG/GT:60; IV Piggyback:1360; TPN:480] Out: 2952 [Urine:5; Drains:25] Intake/Output from this shift: Total I/O In: 43.5 [I.V.:3.5; TPN:40] Out: -   Labs:  Basename 08/20/12 0439 08/19/12 1329 08/19/12 0420 08/18/12 0507  WBC 49.1* -- 42.7* 36.9*  HGB 13.2 -- 13.1 11.9*  HCT 39.1 -- 37.9* 34.7*  PLT 64* -- 69* 83*  APTT -- 51* -- --  INR -- 3.96* -- --     Basename 08/20/12 0439 08/19/12 1600 08/19/12 1200 08/19/12 0420 08/18/12 0507  NA 129* 133* 133* -- --  K 5.1 5.5* 5.5* -- --  CL 97 99 99 -- --  CO2 17* 18* 17* -- --  GLUCOSE 223* 190* 155* -- --  BUN 46* 45* 47* -- --  CREATININE 1.64* 1.62* 1.72* -- --  LABCREA -- -- -- -- --  CREAT24HRUR -- -- -- -- --  CALCIUM 7.5* 7.3* 7.3* -- --  MG 2.6* -- -- 2.6* 2.5  PHOS 7.5* 7.1* -- 6.5* --  PROT 6.1 -- -- -- --  ALBUMIN 1.9* 1.9* -- 2.2* --  AST 77* -- -- -- --  ALT 17 -- -- -- --  ALKPHOS 216* -- -- -- --  BILITOT 3.5* -- -- -- --  BILIDIR -- -- -- -- --  IBILI -- -- -- -- --  PREALBUMIN -- -- -- -- --  TRIG 107 -- -- -- --  CHOLHDL -- -- -- -- --  CHOL 72 -- -- -- --   Estimated Creatinine Clearance: 42.7 ml/min (by C-G formula based on Cr of 1.64).    Basename 08/20/12 0415 08/19/12 2321 08/19/12 1954  GLUCAP 194* 207* 84    Medical History: Past Medical History  Diagnosis Date  . Hypertension   . GERD (gastroesophageal reflux disease)   . Pneumonia     "5-7 years ago" (08/03/2012)  . Exertional dyspnea     "just  recently" (08/03/2012)  . Delirium, acute 08/03/2012  . Prostate cancer     Medications:  Prescriptions prior to admission  Medication Sig Dispense Refill  . aspirin EC 81 MG tablet Take 162 mg by mouth daily.      . cholecalciferol (VITAMIN D) 1000 UNITS tablet Take 1,000 Units by mouth daily.      . ciprofloxacin (CIPRO) 500 MG tablet Take 1 tablet (500 mg total) by mouth 2 (two) times daily.  10 tablet  0  . ciprofloxacin (CIPRO) 500 MG tablet Take 500 mg by mouth 2 (two) times daily. For 10 days starting on 08/07/12      . feeding supplement (ENSURE COMPLETE) LIQD Take 237 mLs by mouth 2 (two) times daily between meals.  60 Bottle  0  . folic acid (FOLVITE) 1 MG tablet Take 1 tablet (1 mg total) by mouth daily.  30 tablet  0  . guaiFENesin-dextromethorphan (ROBITUSSIN DM) 100-10 MG/5ML syrup Take 5 mLs by mouth every 4 (four) hours as needed for cough.  118 mL    . lisinopril (PRINIVIL,ZESTRIL) 10 MG tablet Take 10 mg by mouth daily.      Marland Kitchen  metroNIDAZOLE (FLAGYL) 500 MG tablet Take 500 mg by mouth 3 (three) times daily. For 10 days starting on 08/07/12      . Multiple Vitamin (MULTIVITAMIN WITH MINERALS) TABS Take 1 tablet by mouth daily.  30 tablet  0  . omeprazole (PRILOSEC) 20 MG capsule Take 20 mg by mouth daily.      Marland Kitchen thiamine 100 MG tablet Take 1 tablet (100 mg total) by mouth daily.  30 tablet  0  . vitamin E 400 UNIT capsule Take 800 Units by mouth daily.        Insulin Requirements in the past 24 hours:  5 units SSI  Current Nutrition:  NPO  Assessment:  66 yom admitted on 11/12 with generalized weakness, fatigue and abdominal pain d/t acute pancreatitis and hernia. He has a history of HTN, alcohol abuse, prostate cancer and GERD. Developed encephalopthy and respiratory distress. 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 - elevated amylase/lipase  (increasing) - on PPI Endo: No hx DM - CBGs ok on SSI, also on stress steroids Lytes:  K+5.1,elevated Phos, Mg Renal: Continues on CRRT - Scr improving to 1.64, no UOP documented Pulm: 40% Fio2 Cards: Hx HTN - BP 121/73, HR 86- norepi + vasopressin gtt Hepatobil: AST slightly elevated, Tbili elevated at 3.5  INR 3.96, given vit k 10mg  IV x 1 11/21 Neuro: Sedated on fent/versed ID: Linezolid + micafungin + primaxin for ?PNA, pancreatitis, cellulitis - Afebrile, WBC 49.1 Best Practices: Heparin SQ dc'd, no SCDs ordered, PPI TPN Access: PICC TPN day#: 2  Nutritional Goals:  1900-2000 kCal, 118-130 grams of protein per day.  Goal rate Clinimix 5/15 at 100 ml/hr and lipids MWF will meet 100% estimated needs  Plan:  1. Increase Clinimix 5/15 to 36ml/hr - no electrolytes d/t CRRT and elevated lytes 2. Lipids, MVI + trace elements MWF only d/t national shortage 3. F/u AM labs + CBGs 4. F/u clinical progression, CRRT plans  Torrin Frein Poteet 08/20/2012,8:31 AM

## 2012-08-21 ENCOUNTER — Inpatient Hospital Stay (HOSPITAL_COMMUNITY): Payer: Medicare Other

## 2012-08-21 LAB — CBC WITH DIFFERENTIAL/PLATELET
Eosinophils Absolute: 0 10*3/uL (ref 0.0–0.7)
Eosinophils Relative: 0 % (ref 0–5)
MCH: 36 pg — ABNORMAL HIGH (ref 26.0–34.0)
MCHC: 35.3 g/dL (ref 30.0–36.0)
MCV: 102.2 fL — ABNORMAL HIGH (ref 78.0–100.0)
Metamyelocytes Relative: 0 %
Myelocytes: 0 %
Platelets: 60 10*3/uL — ABNORMAL LOW (ref 150–400)
Promyelocytes Absolute: 0 %
RBC: 3.69 MIL/uL — ABNORMAL LOW (ref 4.22–5.81)
RDW: 16.7 % — ABNORMAL HIGH (ref 11.5–15.5)
WBC Morphology: INCREASED
nRBC: 4 /100 WBC — ABNORMAL HIGH

## 2012-08-21 LAB — PHOSPHORUS: Phosphorus: 4.9 mg/dL — ABNORMAL HIGH (ref 2.3–4.6)

## 2012-08-21 LAB — RENAL FUNCTION PANEL
BUN: 38 mg/dL — ABNORMAL HIGH (ref 6–23)
CO2: 30 mEq/L (ref 19–32)
Calcium: 6.6 mg/dL — ABNORMAL LOW (ref 8.4–10.5)
Chloride: 83 mEq/L — ABNORMAL LOW (ref 96–112)
Creatinine, Ser: 1.35 mg/dL (ref 0.50–1.35)
GFR calc non Af Amer: 53 mL/min — ABNORMAL LOW (ref >90)
Glucose, Bld: 235 mg/dL — ABNORMAL HIGH (ref 70–99)

## 2012-08-21 LAB — COMPREHENSIVE METABOLIC PANEL
ALT: 16 U/L (ref 0–53)
AST: 87 U/L — ABNORMAL HIGH (ref 0–37)
Albumin: 1.8 g/dL — ABNORMAL LOW (ref 3.5–5.2)
Alkaline Phosphatase: 343 U/L — ABNORMAL HIGH (ref 39–117)
CO2: 28 mEq/L (ref 19–32)
Chloride: 90 mEq/L — ABNORMAL LOW (ref 96–112)
Potassium: 5.2 mEq/L — ABNORMAL HIGH (ref 3.5–5.1)
Total Bilirubin: 3.6 mg/dL — ABNORMAL HIGH (ref 0.3–1.2)

## 2012-08-21 LAB — GLUCOSE, CAPILLARY
Glucose-Capillary: 184 mg/dL — ABNORMAL HIGH (ref 70–99)
Glucose-Capillary: 145 mg/dL — ABNORMAL HIGH (ref 70–99)

## 2012-08-21 LAB — PROCALCITONIN: Procalcitonin: 6.05 ng/mL

## 2012-08-21 LAB — PROTIME-INR: INR: 1.13 (ref 0.00–1.49)

## 2012-08-21 LAB — MAGNESIUM: Magnesium: 2.2 mg/dL (ref 1.5–2.5)

## 2012-08-21 MED ORDER — VITAMIN K1 10 MG/ML IJ SOLN
10.0000 mg | Freq: Once | INTRAVENOUS | Status: AC
  Administered 2012-08-21: 10 mg via INTRAVENOUS
  Filled 2012-08-21: qty 1

## 2012-08-21 MED ORDER — PRISMASOL BGK 0/2.5 32-2.5 MEQ/L IV SOLN
INTRAVENOUS | Status: DC
  Filled 2012-08-21 (×5): qty 5000

## 2012-08-21 MED ORDER — CLINIMIX/DEXTROSE (5/15) 5 % IV SOLN
INTRAVENOUS | Status: AC
  Administered 2012-08-21: 18:00:00 via INTRAVENOUS
  Filled 2012-08-21: qty 2000

## 2012-08-21 MED ORDER — PRISMASOL BGK 0/2.5 32-2.5 MEQ/L IV SOLN
INTRAVENOUS | Status: DC
  Administered 2012-08-21 – 2012-08-22 (×6): via INTRAVENOUS_CENTRAL
  Filled 2012-08-21 (×15): qty 5000

## 2012-08-21 NOTE — Progress Notes (Signed)
PARENTERAL NUTRITION CONSULT NOTE   Pharmacy Consult for TPN Indication: severe pancreatitis, presumed pancreatic fistula  No Known Allergies  Patient Measurements: Height: 5\' 4"  (162.6 cm) Weight: 176 lb 12.9 oz (80.2 kg) IBW/kg (Calculated) : 59.2   Vital Signs: Temp: 97.6 F (36.4 C) (11/23 0400) Temp src: Axillary (11/23 0400) BP: 93/54 mmHg (11/23 0416) Pulse Rate: 107  (11/23 0700) Intake/Output from previous day: 11/22 0701 - 11/23 0700 In: 3598.3 [I.V.:634.4; NG/GT:700; IV Piggyback:1050; TPN:1213.8] Out: 4905 [Drains:10] Intake/Output from this shift:    Labs:  [facility] 08/21/12 0423 08/20/12 0900 08/20/12 0439 08/19/12 1329 08/19/12 0420  WBC 48.9* -- 49.1* -- 42.7*  HGB 13.3 -- 13.2 -- 13.1  HCT 37.7* -- 39.1 -- 37.9*  PLT 60* -- 64* -- 69*  APTT 30 34 -- 51* --  INR 1.13 1.21 -- 3.96* --     Basename 08/21/12 0423 08/20/12 0439 08/19/12 1600 08/19/12 0420  NA 133* 129* 133* --  K 5.2* 5.1 5.5* --  CL 90* 97 99 --  CO2 28 17* 18* --  GLUCOSE 213* 223* 190* --  BUN 37* 46* 45* --  CREATININE 1.33 1.64* 1.62* --  LABCREA -- -- -- --  CREAT24HRUR -- -- -- --  CALCIUM 6.9* 7.5* 7.3* --  MG 2.2 2.6* -- 2.6*  PHOS 4.9* 7.5* 7.1* --  PROT 5.8* 6.1 -- --  ALBUMIN 1.8* 1.9* 1.9* --  AST 87* 77* -- --  ALT 16 17 -- --  ALKPHOS 343* 216* -- --  BILITOT 3.6* 3.5* -- --  BILIDIR -- -- -- --  IBILI -- -- -- --  PREALBUMIN -- 4.0* -- --  TRIG -- 107 -- --  CHOLHDL -- -- -- --  CHOL -- 72 -- --   Estimated Creatinine Clearance: 51.5 ml/min (by C-G formula based on Cr of 1.33).    Basename 08/21/12 0346 08/20/12 2319 08/20/12 2003  GLUCAP 184* 191* 154*    Medical History: Past Medical History  Diagnosis Date  . Hypertension   . GERD (gastroesophageal reflux disease)   . Pneumonia     "5-7 years ago" (08/03/2012)  . Exertional dyspnea     "just recently" (08/03/2012)  . Delirium, acute 08/03/2012  . Prostate cancer     Medications:    Prescriptions prior to admission  Medication Sig Dispense Refill  . aspirin EC 81 MG tablet Take 162 mg by mouth daily.      . cholecalciferol (VITAMIN D) 1000 UNITS tablet Take 1,000 Units by mouth daily.      . ciprofloxacin (CIPRO) 500 MG tablet Take 1 tablet (500 mg total) by mouth 2 (two) times daily.  10 tablet  0  . ciprofloxacin (CIPRO) 500 MG tablet Take 500 mg by mouth 2 (two) times daily. For 10 days starting on 08/07/12      . feeding supplement (ENSURE COMPLETE) LIQD Take 237 mLs by mouth 2 (two) times daily between meals.  60 Bottle  0  . folic acid (FOLVITE) 1 MG tablet Take 1 tablet (1 mg total) by mouth daily.  30 tablet  0  . guaiFENesin-dextromethorphan (ROBITUSSIN DM) 100-10 MG/5ML syrup Take 5 mLs by mouth every 4 (four) hours as needed for cough.  118 mL    . lisinopril (PRINIVIL,ZESTRIL) 10 MG tablet Take 10 mg by mouth daily.      . metroNIDAZOLE (FLAGYL) 500 MG tablet Take 500 mg by mouth 3 (three) times daily. For 10 days starting on 08/07/12      .  Multiple Vitamin (MULTIVITAMIN WITH MINERALS) TABS Take 1 tablet by mouth daily.  30 tablet  0  . omeprazole (PRILOSEC) 20 MG capsule Take 20 mg by mouth daily.      Marland Kitchen thiamine 100 MG tablet Take 1 tablet (100 mg total) by mouth daily.  30 tablet  0  . vitamin E 400 UNIT capsule Take 800 Units by mouth daily.        Insulin Requirements in the past 24 hours:  8 units SSI  Current Nutrition:  NPO  Assessment:  66 yom admitted on 11/12 with generalized weakness, fatigue and abdominal pain d/t acute pancreatitis and hernia. He has a history of HTN, alcohol abuse, prostate cancer and GERD. Developed encephalopthy and respiratory distress. 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 - elevated amylase/lipase (increasing) - on PPI Endo: No hx DM - CBGs slightly elevated 154-191, also on stress  steroids Lytes:  K+5.2,Phos 4.9, Mg 2.2 Renal: Continues on CRRT - Scr improving to 1.33, no UOP documented Pulm: 40% Fio2 Cards: Hx HTN - BP 93/54, HR 107- norepi + vasopressin gtt Hepatobil: AST slightly elevated, Tbili elevated at 3.6 INR 3.96, given vit k 10mg  IV x 1 11/21 Neuro: Sedated on fent/versed ID: Linezolid + micafungin + primaxin for ?PNA, pancreatitis, cellulitis - Afebrile, WBC 48.9 (To clarify, zyvox is not a MAO inhibitor but can cause increases in BP) Best Practices: Heparin SQ dc'd, no SCDs ordered, PPI TPN Access: PICC TPN day#: 3  Nutritional Goals:  1900-2000 kCal, 118-130 grams of protein per day.  Goal rate Clinimix 5/15 at 100 ml/hr and lipids MWF will meet 100% estimated needs  Plan:  1. Increase Clinimix 5/15 to 27ml/hr - no electrolytes d/t CRRT and elevated lytes 2. Lipids, MVI + trace elements MWF only d/t national shortage 3. F/u AM labs + CBGs 4. F/u clinical progression, CRRT plans  Gedalia Mcmillon Poteet 08/21/2012,7:51 AM

## 2012-08-21 NOTE — Progress Notes (Signed)
Patient ID: Gary [name], male   DOB: 09-19-1946, 67 y.o.   MRN: [id number]   [facility] Progress Note    Subjective:   No acute events overnight. Remains on pressors and with no neurological improvement. Ischemic changes over digits noted.   Objective:   BP 93/54  Pulse 105  Temp 97.6 F (36.4 C) (Axillary)  Resp 30  Ht 5\' 4"  (1.626 m)  Wt 80.2 kg (176 lb 12.9 oz)  BMI 30.35 kg/m2  SpO2 100%  Intake/Output Summary (Last 24 hours) at 08/21/12 0840 Last data filed at 08/21/12 0800  Gross per 24 hour  Intake 3719.17 ml  Output   4942 ml  Net -1222.83 ml   Weight change: -3.5 kg (-7 lb 11.5 oz)  Physical Exam: WUJ:[id number] on vent OZH:YQMVH regular tachycardia, normal s1 and s2  Resp:Coarse BS bilaterally, no rales QIO:NGEX, obese, perihepatic drain in situ Ext:2+LE edema with dusky digits  Imaging: Ct Abdomen Pelvis Wo Contrast  08/20/2012  *RADIOLOGY REPORT*  Clinical Data:  Evaluate for loculated pleural effusion or abscess.  CT CHEST, ABDOMEN AND PELVIS WITHOUT CONTRAST  Technique:  Multidetector CT imaging of the chest, abdomen and pelvis was performed following the standard protocol without IV contrast.  Comparison:  CT of the abdomen and pelvis 08/03/2012.  CT CHEST  Findings:  Mediastinum: The patient is intubated with the tip of the endotracheal tube just below the level of the thoracic inlet. Nasogastric tube extends just beyond the gastroesophageal junction into the stomach.  Right upper extremity PICC terminates at the superior cavoatrial junction.  Left internal jugular central venous catheter tip terminates proximal superior vena cava. Heart size is normal. Trace amount of pericardial fluid and/or thickening, unlikely to be of hemodynamic significance at this time. No pathologically enlarged mediastinal or hilar lymph nodes. Please note that accurate exclusion of hilar adenopathy is limited on noncontrast CT scans. There is atherosclerosis of the  thoracic aorta, the great vessels of the mediastinum and the coronary arteries, including calcified atherosclerotic plaque in the left anterior descending coronary arteries. Esophagus is unremarkable in appearance.  Lungs/Pleura: Large right pleural effusion layers dependently. Trace left pleural effusion.  There is extensive passive atelectasis involving nearly the entire right lower lobe and a portion of the left lower lobe.  No definite consolidative airspace disease.  Musculoskeletal: There are no aggressive appearing lytic or blastic lesions noted in the visualized portions of the skeleton.  IMPRESSION:  1.  Large right and small left pleural effusions appear to layer dependently, and are associated with passive atelectasis in the lower lobes (near complete right lower lobe atelectasis and a small amount of left lower lobe atelectasis). 2. Atherosclerosis, including left anterior descending coronary artery disease. 3.  Support apparatus, as above.  CT ABDOMEN AND PELVIS  Findings:  Abdomen/Pelvis: Compared to the prior examination there has been interval placement of a pigtail drainage catheter in the right upper quadrant of the abdomen in the previously noted right-sided subcapsular fluid collection which has nearly completely resolved. The left sided subcapsular fluid collection associated with the liver is similar in size, as is a small collection medial to the caudate lobe.  The liver has a shrunken appearance and nodular contour, compatible with cirrhosis.  High attenuation material is again noted within the lumen of the gallbladder, compatible with biliary sludge and/or small gallstones.  Calcifications in the pancreatic parenchyma, predominately in the head and uncinate process, likely reflect sequelae of chronic pancreatitis.  Small low attenuation lesion  associated with the proximal body of the pancreas is similar to the prior study measuring approximate 2.3 cm, likely represent a small pseudocyst  (technically not characterized).  The unenhanced appearance of the spleen, bilateral adrenal glands and bilateral kidneys is unremarkable.  Moderate volume of ascites has increased compared to the prior study, and does not appear to be obviously loculated.  No pneumoperitoneum.  No pathologic distension of small bowel. Diffuse stranding throughout the omentum and mesentery.  Foley balloon catheter within the lumen of the urinary bladder which is completely decompressed. Extensive atherosclerosis throughout the abdominal and pelvic vasculature, without definite aneurysm.  Musculoskeletal: There are no aggressive appearing lytic or blastic lesions noted in the visualized portions of the skeleton.  IMPRESSION:  1.  The recently placed pigtail drainage catheter appears to be successfully draining the subcapsular fluid collection over the right lobe of the liver. 2.  Increased moderate volume of ascites compared to the prior study and increase in omental and mesenteric edema.  The possibility of malignant peritoneal disease and malignant ascites is not excluded.  Clinical correlation is recommended. 3.  Diffuse body wall edema has significantly increased compared to the prior study.  The overall appearance suggests a state of anasarca. 4.  Cirrhosis. 5.  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 6.  Additional incidental findings, as above, similar to prior examination.   Original Report Authenticated By: Trudie Reed, M.D.    Ct Chest Wo Contrast  08/20/2012  *RADIOLOGY REPORT*  Clinical Data:  Evaluate for loculated pleural effusion or abscess.  CT CHEST, ABDOMEN AND PELVIS WITHOUT CONTRAST  Technique:  Multidetector CT imaging of the chest, abdomen and pelvis was performed following the standard protocol without IV contrast.  Comparison:  CT of the abdomen and pelvis 08/11/2012.  CT CHEST  Findings:  Mediastinum: The patient is  intubated with the tip of the endotracheal tube just below the level of the thoracic inlet. Nasogastric tube extends just beyond the gastroesophageal junction into the stomach.  Right upper extremity PICC terminates at the superior cavoatrial junction.  Left internal jugular central venous catheter tip terminates proximal superior vena cava. Heart size is normal. Trace amount of pericardial fluid and/or thickening, unlikely to be of hemodynamic significance at this time. No pathologically enlarged mediastinal or hilar lymph nodes. Please note that accurate exclusion of hilar adenopathy is limited on noncontrast CT scans. There is atherosclerosis of the thoracic aorta, the great vessels of the mediastinum and the coronary arteries, including calcified atherosclerotic plaque in the left anterior descending coronary arteries. Esophagus is unremarkable in appearance.  Lungs/Pleura: Large right pleural effusion layers dependently. Trace left pleural effusion.  There is extensive passive atelectasis involving nearly the entire right lower lobe and a portion of the left lower lobe.  No definite consolidative airspace disease.  Musculoskeletal: There are no aggressive appearing lytic or blastic lesions noted in the visualized portions of the skeleton.  IMPRESSION:  1.  Large right and small left pleural effusions appear to layer dependently, and are associated with passive atelectasis in the lower lobes (near complete right lower lobe atelectasis and a small amount of left lower lobe atelectasis). 2. Atherosclerosis, including left anterior descending coronary artery disease. 3.  Support apparatus, as above.  CT ABDOMEN AND PELVIS  Findings:  Abdomen/Pelvis: Compared to the prior examination there has been interval placement of a pigtail drainage catheter in the right upper quadrant of the abdomen  in the previously noted right-sided subcapsular fluid collection which has nearly completely resolved. The left sided  subcapsular fluid collection associated with the liver is similar in size, as is a small collection medial to the caudate lobe.  The liver has a shrunken appearance and nodular contour, compatible with cirrhosis.  High attenuation material is again noted within the lumen of the gallbladder, compatible with biliary sludge and/or small gallstones.  Calcifications in the pancreatic parenchyma, predominately in the head and uncinate process, likely reflect sequelae of chronic pancreatitis.  Small low attenuation lesion associated with the proximal body of the pancreas is similar to the prior study measuring approximate 2.3 cm, likely represent a small pseudocyst (technically not characterized).  The unenhanced appearance of the spleen, bilateral adrenal glands and bilateral kidneys is unremarkable.  Moderate volume of ascites has increased compared to the prior study, and does not appear to be obviously loculated.  No pneumoperitoneum.  No pathologic distension of small bowel. Diffuse stranding throughout the omentum and mesentery.  Foley balloon catheter within the lumen of the urinary bladder which is completely decompressed. Extensive atherosclerosis throughout the abdominal and pelvic vasculature, without definite aneurysm.  Musculoskeletal: There are no aggressive appearing lytic or blastic lesions noted in the visualized portions of the skeleton.  IMPRESSION:  1.  The recently placed pigtail drainage catheter appears to be successfully draining the subcapsular fluid collection over the right lobe of the liver. 2.  Increased moderate volume of ascites compared to the prior study and increase in omental and mesenteric edema.  The possibility of malignant peritoneal disease and malignant ascites is not excluded.  Clinical correlation is recommended. 3.  Diffuse body wall edema has significantly increased compared to the prior study.  The overall appearance suggests a state of anasarca. 4.  Cirrhosis. 5.  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 6.  Additional incidental findings, as above, similar to prior examination.   Original Report Authenticated By: Trudie Reed, M.D.    Dg Chest Port 1 View  08/21/2012  *RADIOLOGY REPORT*  Clinical Data: Endotracheal tube placement.  PORTABLE CHEST - 1 VIEW  Comparison: 08/20/2012  Findings: Endotracheal tube remains in place with tip about 4.3 cm above the carina.  Enteric tube tip is not visible but is below the left hemidiaphragm consistent with location at least in the stomach.  Left central venous catheter is unchanged in position with tip projected over the mediastinum, likely at the origin of the brachial cephalic vein.  A right PICC line is present with tip over the mid SVC region.  No pneumothorax.  Normal heart size and pulmonary vascularity.  Right pleural effusion with infiltration in the right lung base.  IMPRESSION: Appliances positioned as described.  Persistent right pleural effusion and right basilar infiltration.   Original Report Authenticated By: Burman Nieves, M.D.    Dg Chest Port 1 View  08/20/2012  *RADIOLOGY REPORT*  Clinical Data: Intubation.  Pneumonia.  Hypertension.  PORTABLE CHEST - 1 VIEW  Comparison: 08/19/2012  Findings: Pleural fluid obscures right hemidiaphragm and extends along the periphery of the right lung.  There is coarse reticular type opacity in the right lower lung consistent with atelectasis. Infiltrate is possible.  These findings are stable.  The left lung is clear.  The heart is normal in size.  No pneumothorax.  The endotracheal tube, nasogastric tube, left internal jugular dual lumen central venous catheter and right PICC are  stable from the previous day's exam.  IMPRESSION: Right pleural effusion with right lower lung zone atelectasis or infiltrate.  Support apparatus as described.  No change from previous day's study.   Original Report  Authenticated By: Amie Portland, M.D.     Labs: BMET  Lab 08/21/12 0423 08/20/12 0439 08/19/12 1600 08/19/12 1200 08/19/12 0420 08/18/12 1200 08/18/12 0507 08/17/12 1600 08/17/12 0410  NA 133* 129* 133* 133* 136 136 137 -- --  K 5.2* 5.1 5.5* 5.5* 5.4* 4.9 5.3* -- --  CL 90* 97 99 99 100 101 101 -- --  CO2 28 17* 18* 17* 21 20 21  -- --  GLUCOSE 213* 223* 190* 155* 139* 159* 164* -- --  BUN 37* 46* 45* 47* 45* 49* 52* -- --  CREATININE 1.33 1.64* 1.62* 1.72* 1.71* 1.91* 2.00* -- --  ALB -- -- -- -- -- -- -- -- --  CALCIUM 6.9* 7.5* 7.3* 7.3* 7.0* 6.7* 6.8* -- --  PHOS 4.9* 7.5* 7.1* -- 6.5* -- 6.0* 6.4* 5.9*   CBC  Lab 08/21/12 0423 08/20/12 0439 08/19/12 0420 08/18/12 0507 08/17/12 0411  WBC 48.9* 49.1* 42.7* 36.9* --  NEUTROABS 44.5* 46.6* -- -- 27.6*  HGB 13.3 13.2 13.1 11.9* --  HCT 37.7* 39.1 37.9* 34.7* --  MCV 102.2* 103.2* 102.4* 100.3* --  PLT 60* 64* 69* 83* --    Medications:      . albuterol  6 puff Inhalation Q6H  . antiseptic oral rinse  15 mL Mouth Rinse QID  . chlorhexidine  15 mL Mouth/Throat BID  . [COMPLETED] dextrose      . hydrocortisone sod succinate (SOLU-CORTEF) injection  50 mg Intravenous Q6H  . imipenem-cilastatin  500 mg Intravenous Q6H  . insulin aspart  0-9 Units Subcutaneous Q4H  . [COMPLETED] iohexol  20 mL Oral Q1 Hr x 2  . ketoconazole  1 application Topical BID  . linezolid  600 mg Intravenous Q12H  . micafungin (MYCAMINE) IV  100 mg Intravenous Daily  . pantoprazole (PROTONIX) IV  40 mg Intravenous Q24H  . [COMPLETED] phytonadione (VITAMIN K) IV  10 mg Intravenous Once  . sodium chloride  10-40 mL Intracatheter Q12H  . sodium chloride irrigation  5 mL Irrigation Q8H     Assessment/ Plan:   1. ARF, oliguric- most consistent with ischemic ATN with the differentials of AIN/intra-abdominal hypertension. In spite of adjustments to CRRT prescription- continues to have hyperkalemia and likely indicative of tissue necrosis. No evident renal  recovery and no evident neurological function. Unfortunately, appears to be actively dying in spite of multi-organ support. Would favour stopping current measures/withdrawal of care at this point. 2. VDRF- per PCCM, No indications of improvement/recovery noted- will need family discussion on futility of care 3. SIRS secondary to pancreatitis/incarcerated abdominal hernia- on low dose pressors and CRRT support as above, on multiple pressors. Off BSABx. Ongoing close evaluation by surgery.  4. Pleural Effusion- S/P thoracentesis- studies pending  5. Ascites with H/O cirrhosis-   Zetta Bills, MD 08/21/2012, 8:40 AM

## 2012-08-21 NOTE — Progress Notes (Signed)
Subjective: Pt with no acute changes overnight.  CT reveals decreased intra-abd ascites.  He remains sedated and on Vent.  CVVHD.  Pt con't septic at this time.  Objective: Vital signs in last 24 hours: Temp:  [95.2 F (35.1 C)-97.6 F (36.4 C)] 97.6 F (36.4 C) (11/23 0400) Pulse Rate:  [83-108] 105  (11/23 0800) Resp:  [22-32] 30  (11/23 0800) BP: (93-128)/(54-72) 93/54 mmHg (11/23 0416) SpO2:  [98 %-100 %] 100 % (11/23 0825) Arterial Line BP: (70-146)/(45-80) 94/52 mmHg (11/23 0800) FiO2 (%):  [40 %] 40 % (11/23 0825) Weight:  [176 lb 12.9 oz (80.2 kg)] 176 lb 12.9 oz (80.2 kg) (11/23 0431) Last BM Date: 08/20/12  Intake/Output from previous day: 11/22 0701 - 11/23 0700 In: 3698.3 [I.V.:634.4; NG/GT:700; IV Piggyback:1150; TPN:1213.8] Out: 4905 [Drains:10] Intake/Output this shift: Total I/O In: 84.7 [I.V.:24.7; TPN:60] Out: 180 [Other:180]  Gen: sedated Pulm: intubated, coarse B BS Abd: firm, nt, nd, decreased BS, abd drain in place   Lab Results:   [facility] 08/21/12 0423 08/20/12 0439  WBC 48.9* 49.1*  HGB 13.3 13.2  HCT 37.7* 39.1  PLT 60* 64*   BMET  Basename 08/21/12 0423 08/20/12 0439  NA 133* 129*  K 5.2* 5.1  CL 90* 97  CO2 28 17*  GLUCOSE 213* 223*  BUN 37* 46*  CREATININE 1.33 1.64*  CALCIUM 6.9* 7.5*   PT/INR  Basename 08/21/12 0423 08/20/12 0900  LABPROT 14.3 15.1  INR 1.13 1.21   ABG  Basename 08/20/12 0930 08/19/12 0336  PHART 7.285* 7.304*  HCO3 17.5* 21.1    Studies/Results: Ct Abdomen Pelvis Wo Contrast  08/20/2012  *RADIOLOGY REPORT*  Clinical Data:  Evaluate for loculated pleural effusion or abscess.  CT CHEST, ABDOMEN AND PELVIS WITHOUT CONTRAST  Technique:  Multidetector CT imaging of the chest, abdomen and pelvis was performed following the standard protocol without IV contrast.  Comparison:  CT of the abdomen and pelvis September 07, 2012.  CT CHEST  Findings:  Mediastinum: The patient is intubated with the tip of the  endotracheal tube just below the level of the thoracic inlet. Nasogastric tube extends just beyond the gastroesophageal junction into the stomach.  Right upper extremity PICC terminates at the superior cavoatrial junction.  Left internal jugular central venous catheter tip terminates proximal superior vena cava. Heart size is normal. Trace amount of pericardial fluid and/or thickening, unlikely to be of hemodynamic significance at this time. No pathologically enlarged mediastinal or hilar lymph nodes. Please note that accurate exclusion of hilar adenopathy is limited on noncontrast CT scans. There is atherosclerosis of the thoracic aorta, the great vessels of the mediastinum and the coronary arteries, including calcified atherosclerotic plaque in the left anterior descending coronary arteries. Esophagus is unremarkable in appearance.  Lungs/Pleura: Large right pleural effusion layers dependently. Trace left pleural effusion.  There is extensive passive atelectasis involving nearly the entire right lower lobe and a portion of the left lower lobe.  No definite consolidative airspace disease.  Musculoskeletal: There are no aggressive appearing lytic or blastic lesions noted in the visualized portions of the skeleton.  IMPRESSION:  1.  Large right and small left pleural effusions appear to layer dependently, and are associated with passive atelectasis in the lower lobes (near complete right lower lobe atelectasis and a small amount of left lower lobe atelectasis). 2. Atherosclerosis, including left anterior descending coronary artery disease. 3.  Support apparatus, as above.  CT ABDOMEN AND PELVIS  Findings:  Abdomen/Pelvis: Compared to the prior examination  there has been interval placement of a pigtail drainage catheter in the right upper quadrant of the abdomen in the previously noted right-sided subcapsular fluid collection which has nearly completely resolved. The left sided subcapsular fluid collection associated  with the liver is similar in size, as is a small collection medial to the caudate lobe.  The liver has a shrunken appearance and nodular contour, compatible with cirrhosis.  High attenuation material is again noted within the lumen of the gallbladder, compatible with biliary sludge and/or small gallstones.  Calcifications in the pancreatic parenchyma, predominately in the head and uncinate process, likely reflect sequelae of chronic pancreatitis.  Small low attenuation lesion associated with the proximal body of the pancreas is similar to the prior study measuring approximate 2.3 cm, likely represent a small pseudocyst (technically not characterized).  The unenhanced appearance of the spleen, bilateral adrenal glands and bilateral kidneys is unremarkable.  Moderate volume of ascites has increased compared to the prior study, and does not appear to be obviously loculated.  No pneumoperitoneum.  No pathologic distension of small bowel. Diffuse stranding throughout the omentum and mesentery.  Foley balloon catheter within the lumen of the urinary bladder which is completely decompressed. Extensive atherosclerosis throughout the abdominal and pelvic vasculature, without definite aneurysm.  Musculoskeletal: There are no aggressive appearing lytic or blastic lesions noted in the visualized portions of the skeleton.  IMPRESSION:  1.  The recently placed pigtail drainage catheter appears to be successfully draining the subcapsular fluid collection over the right lobe of the liver. 2.  Increased moderate volume of ascites compared to the prior study and increase in omental and mesenteric edema.  The possibility of malignant peritoneal disease and malignant ascites is not excluded.  Clinical correlation is recommended. 3.  Diffuse body wall edema has significantly increased compared to the prior study.  The overall appearance suggests a state of anasarca. 4.  Cirrhosis. 5.  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 6.  Additional incidental findings, as above, similar to prior examination.   Original Report Authenticated By: Trudie Reed, M.D.    Ct Chest Wo Contrast  08/20/2012  *RADIOLOGY REPORT*  Clinical Data:  Evaluate for loculated pleural effusion or abscess.  CT CHEST, ABDOMEN AND PELVIS WITHOUT CONTRAST  Technique:  Multidetector CT imaging of the chest, abdomen and pelvis was performed following the standard protocol without IV contrast.  Comparison:  CT of the abdomen and pelvis 08-19-12.  CT CHEST  Findings:  Mediastinum: The patient is intubated with the tip of the endotracheal tube just below the level of the thoracic inlet. Nasogastric tube extends just beyond the gastroesophageal junction into the stomach.  Right upper extremity PICC terminates at the superior cavoatrial junction.  Left internal jugular central venous catheter tip terminates proximal superior vena cava. Heart size is normal. Trace amount of pericardial fluid and/or thickening, unlikely to be of hemodynamic significance at this time. No pathologically enlarged mediastinal or hilar lymph nodes. Please note that accurate exclusion of hilar adenopathy is limited on noncontrast CT scans. There is atherosclerosis of the thoracic aorta, the great vessels of the mediastinum and the coronary arteries, including calcified atherosclerotic plaque in the left anterior descending coronary arteries. Esophagus is unremarkable in appearance.  Lungs/Pleura: Large right pleural effusion layers dependently. Trace left pleural effusion.  There is extensive passive atelectasis involving nearly the entire right lower lobe and a portion of the left lower lobe.  No definite consolidative airspace disease.  Musculoskeletal: There are no aggressive appearing lytic or blastic lesions noted in the visualized portions of the skeleton.  IMPRESSION:  1.  Large right and small left pleural  effusions appear to layer dependently, and are associated with passive atelectasis in the lower lobes (near complete right lower lobe atelectasis and a small amount of left lower lobe atelectasis). 2. Atherosclerosis, including left anterior descending coronary artery disease. 3.  Support apparatus, as above.  CT ABDOMEN AND PELVIS  Findings:  Abdomen/Pelvis: Compared to the prior examination there has been interval placement of a pigtail drainage catheter in the right upper quadrant of the abdomen in the previously noted right-sided subcapsular fluid collection which has nearly completely resolved. The left sided subcapsular fluid collection associated with the liver is similar in size, as is a small collection medial to the caudate lobe.  The liver has a shrunken appearance and nodular contour, compatible with cirrhosis.  High attenuation material is again noted within the lumen of the gallbladder, compatible with biliary sludge and/or small gallstones.  Calcifications in the pancreatic parenchyma, predominately in the head and uncinate process, likely reflect sequelae of chronic pancreatitis.  Small low attenuation lesion associated with the proximal body of the pancreas is similar to the prior study measuring approximate 2.3 cm, likely represent a small pseudocyst (technically not characterized).  The unenhanced appearance of the spleen, bilateral adrenal glands and bilateral kidneys is unremarkable.  Moderate volume of ascites has increased compared to the prior study, and does not appear to be obviously loculated.  No pneumoperitoneum.  No pathologic distension of small bowel. Diffuse stranding throughout the omentum and mesentery.  Foley balloon catheter within the lumen of the urinary bladder which is completely decompressed. Extensive atherosclerosis throughout the abdominal and pelvic vasculature, without definite aneurysm.  Musculoskeletal: There are no aggressive appearing lytic or blastic lesions noted  in the visualized portions of the skeleton.  IMPRESSION:  1.  The recently placed pigtail drainage catheter appears to be successfully draining the subcapsular fluid collection over the right lobe of the liver. 2.  Increased moderate volume of ascites compared to the prior study and increase in omental and mesenteric edema.  The possibility of malignant peritoneal disease and malignant ascites is not excluded.  Clinical correlation is recommended. 3.  Diffuse body wall edema has significantly increased compared to the prior study.  The overall appearance suggests a state of anasarca. 4.  Cirrhosis. 5.  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 6.  Additional incidental findings, as above, similar to prior examination.   Original Report Authenticated By: Trudie Reed, M.D.    Dg Chest Port 1 View  08/21/2012  *RADIOLOGY REPORT*  Clinical Data: Endotracheal tube placement.  PORTABLE CHEST - 1 VIEW  Comparison: 08/20/2012  Findings: Endotracheal tube remains in place with tip about 4.3 cm above the carina.  Enteric tube tip is not visible but is below the left hemidiaphragm consistent with location at least in the stomach.  Left central venous catheter is unchanged in position with tip projected over the mediastinum, likely at the origin of the brachial cephalic vein.  A right PICC line is present with tip over the mid SVC region.  No pneumothorax.  Normal heart size and pulmonary vascularity.  Right pleural effusion with infiltration in the right lung base.  IMPRESSION: Appliances positioned as described.  Persistent right pleural effusion and right basilar infiltration.  Original Report Authenticated By: Burman Nieves, M.D.    Dg Chest Port 1 View  08/20/2012  *RADIOLOGY REPORT*  Clinical Data: Intubation.  Pneumonia.  Hypertension.  PORTABLE CHEST - 1 VIEW  Comparison: 08/19/2012  Findings: Pleural fluid obscures right  hemidiaphragm and extends along the periphery of the right lung.  There is coarse reticular type opacity in the right lower lung consistent with atelectasis. Infiltrate is possible.  These findings are stable.  The left lung is clear.  The heart is normal in size.  No pneumothorax.  The endotracheal tube, nasogastric tube, left internal jugular dual lumen central venous catheter and right PICC are stable from the previous day's exam.  IMPRESSION: Right pleural effusion with right lower lung zone atelectasis or infiltrate.  Support apparatus as described.  No change from previous day's study.   Original Report Authenticated By: Amie Portland, M.D.     Anti-infectives: Anti-infectives     Start     Dose/Rate Route Frequency Ordered Stop   08/19/12 1330   linezolid (ZYVOX) IVPB 600 mg        600 mg 300 mL/hr over 60 Minutes Intravenous Every 12 hours 08/19/12 1309     08/18/12 1845   imipenem-cilastatin (PRIMAXIN) 500 mg in sodium chloride 0.9 % 100 mL IVPB        500 mg 200 mL/hr over 30 Minutes Intravenous 4 times per day 08/18/12 1838     08/17/12 0900   vancomycin (VANCOCIN) IVPB 1000 mg/200 mL premix  Status:  Discontinued        1,000 mg 200 mL/hr over 60 Minutes Intravenous Every 24 hours 08/17/12 0730 08/19/12 1308   08/16/12 1200   micafungin (MYCAMINE) 100 mg in sodium chloride 0.9 % 100 mL IVPB        100 mg 100 mL/hr over 1 Hours Intravenous Daily 08/16/12 1146     08/15/12 1300   imipenem-cilastatin (PRIMAXIN) 250 mg in sodium chloride 0.9 % 100 mL IVPB  Status:  Discontinued        250 mg 200 mL/hr over 30 Minutes Intravenous 4 times per day 08/15/12 1119 08/18/12 1838   08/13/12 1400   vancomycin (VANCOCIN) 750 mg in sodium chloride 0.9 % 150 mL IVPB  Status:  Discontinued        750 mg 150 mL/hr over 60 Minutes Intravenous Every 12 hours 08/13/12 1254 08/15/12 1115   08/13/12 1400   imipenem-cilastatin (PRIMAXIN) 500 mg in sodium chloride 0.9 % 100 mL IVPB  Status:   Discontinued        500 mg 200 mL/hr over 30 Minutes Intravenous 3 times per day 08/13/12 1254 08/15/12 1119   08/12/12 1700   levofloxacin (LEVAQUIN) IVPB 750 mg  Status:  Discontinued        750 mg 100 mL/hr over 90 Minutes Intravenous Every 48 hours 08/12/2012 1749 08/12/12 1130   08/12/12 0600   vancomycin (VANCOCIN) IVPB 1000 mg/200 mL premix  Status:  Discontinued        1,000 mg 200 mL/hr over 60 Minutes Intravenous Every 24 hours 08/11/12 1141 08/12/12 1130   08/08/2012 2200   piperacillin-tazobactam (ZOSYN) IVPB 3.375 g  Status:  Discontinued        3.375 g 12.5 mL/hr over 240 Minutes Intravenous Every 8 hours 08/17/2012 1749 08/12/12 1130   08/11/2012 1800   vancomycin (VANCOCIN) 750 mg in sodium chloride 0.9 % 150 mL IVPB  Status:  Discontinued        750  mg 150 mL/hr over 60 Minutes Intravenous Every 12 hours Aug 11, 2012 1749 08/11/12 1141   August 11, 2012 1530   vancomycin (VANCOCIN) IVPB 1000 mg/200 mL premix  Status:  Discontinued        1,000 mg 200 mL/hr over 60 Minutes Intravenous  Once 08/11/12 1528 2012-08-11 1808   August 11, 2012 1530   piperacillin-tazobactam (ZOSYN) IVPB 3.375 g        3.375 g 100 mL/hr over 30 Minutes Intravenous  Once 2012/08/11 1528 08-11-12 1630   08/11/2012 1530   levofloxacin (LEVAQUIN) IVPB 750 mg  Status:  Discontinued        750 mg 100 mL/hr over 90 Minutes Intravenous  Once 08/11/12 1528 11-Aug-2012 1808          Assessment/Plan: s/p * No surgery found * -Con't Pulm and Renal support -Drain helping with intraabd ascites -outlook grim    LOS: 11 days    Marigene Ehlers., Jed Limerick 08/21/2012

## 2012-08-21 NOTE — Progress Notes (Signed)
PULMONARY  / CRITICAL CARE MEDICINE  Name: Gary [name] MRN: [id number] DOB: Oct 03, 1945    LOS: 11  REFERRING MD :  [name]  CHIEF COMPLAINT: encephalopathy, respiratory insufficiency  BRIEF PATIENT DESCRIPTION:  66 year old white male admitted 11/12 for abdominal pain in the setting of acute pancreatitis an incarcerated left inguinal hernia. Initially admitted to medicine service. Critical care asked to see on 11/15 for encephalopathy, and concern for progressive respiratory distress and inability to protect airway.  LINES / TUBES: RUE PICC line 11/15>>> L IJ HD Cath 11/17>>> R Rad Aline 11/16>>>  CULTURES: 11/16 Resp >>>CANDIDA ALBICANS 11/13 urine culture: Negative 11/12 blood culture x2>>>Negative 11/21 BC>>>ng  ANTIBIOTICS: Levofloxacin 11/14>>>11/14>1 dose. Zosyn 11/12>>>11/14 Primaxin 11/15>>> Vancomycin 11/12>>>11/14>>> 11/15>>>11/20 Micafungin 11/18>>> Linezolid 11/20 -->  SIGNIFICANT EVENTS:  CT abdomen 11/12: Enlargement of. Pancreatic fluid collection with significant mass effect on both right and left lobe of liver. Suggesting enlarging pseudocysts. Nonspecific nodularity edema of the mesentery with no focal mass. Stable left inguinal hernia. Abdominal ultrasound 11/12: gallstone with echogenic sludge, but no evidence of biliary obstruction or acute cholecystitis. Increased ascites compared to CT 7 days prior fluid around the liver with mass effect. ECHO 11/12: Systolic function was normal. The estimated ejection fraction was in the range of 50% to 55%. There was an increased relative contribution of atrial contraction to ventricular filling.  Bilateral LE ultrasound on11/15>>>>no DVT Bedside Abd U/S: no pockets of fluid, minimal ascites   LEVEL OF CARE:  ICU PRIMARY SERVICE:  PCCM CONSULTANTS:  IR, Gen Surg, nephro CODE STATUS full code DIET:  TPN DVT Px:   GI Px: Protonix  SUBJ : reminas unresponsive, WUA in progress, on pressors, transient hypothermic  yesterday  VITAL SIGNS: Temp:  [95.2 F (35.1 C)-97.7 F (36.5 C)] 97.7 F (36.5 C) (11/23 0800) Pulse Rate:  [83-108] 106  (11/23 0900) Resp:  [22-32] 25  (11/23 0900) BP: (93-128)/(54-64) 93/54 mmHg (11/23 0416) SpO2:  [98 %-100 %] 99 % (11/23 0900) Arterial Line BP: (70-146)/(45-80) 105/54 mmHg (11/23 0900) FiO2 (%):  [40 %] 40 % (11/23 0825) Weight:  [80.2 kg (176 lb 12.9 oz)] 80.2 kg (176 lb 12.9 oz) (11/23 0431)  HEMODYNAMICS: CVP:  [6 mmHg-7 mmHg] 6 mmHg  VENTILATOR SETTINGS: Vent Mode:  [-] PRVC FiO2 (%):  [40 %] 40 % Set Rate:  [30 bmp] 30 bmp Vt Set:  [470 mL] 470 mL PEEP:  [5 cmH20] 5 cmH20 Plateau Pressure:  [17 cmH20-21 cmH20] 20 cmH20  INTAKE / OUTPUT: Intake/Output      11/22 0701 - 11/23 0700 11/23 0701 - 11/24 0700   I.V. (mL/kg) 634.4 (7.9) 49.4 (0.6)   Other     NG/GT 700    IV Piggyback 1150    TPN 1213.8 120   Total Intake(mL/kg) 3698.3 (46.1) 169.4 (2.1)   Urine (mL/kg/hr)     Drains 10    Other 4895 363   Total Output 4905 363   Net -1206.7 -193.6         PHYSICAL EXAMINATION: General:  Chronically ill-appearing 66 year old male appears older than stated age.  Neuro:  Sedated and intubated.  Withdraws from pain in all 4,  HEENT:  PERRL, no JVD. Cardiovascular:  No murmur rub or gallop. RRR Lungs: minimal scattered Rhonchi, no wheezes, or rales Abdomen:  Mildly Distended, firm, generalized discomfort to palpation. Hypoactive bowel sounds. Musculoskeletal:  3+ pitting edema in all 4 extremities.  Withdraws to pain.  Skin:  Lower extremities are both swollen and  edematous with blisters noted,  Erythema around the ankles. Cool to touch, withdraws in pain with gentle palpation bilaterally, cap refill 6 seconds bilaterally.  Pulses difficult to doppler with bandages and edema.   LABS:  Lab 08/21/12 0423 08/20/12 0930 08/20/12 0900 08/20/12 0439 08/19/12 1600 08/19/12 1329 08/19/12 0420 08/19/12 0336 08/18/12 1038 08/16/12 0930 08/15/12 0355  HGB  13.3 -- -- 13.2 -- -- 13.1 -- -- -- --  WBC 48.9* -- -- 49.1* -- -- 42.7* -- -- -- --  PLT 60* -- -- 64* -- -- 69* -- -- -- --  NA 133* -- -- 129* 133* -- -- -- -- -- --  K 5.2* -- -- 5.1 -- -- -- -- -- -- --  CL 90* -- -- 97 99 -- -- -- -- -- --  CO2 28 -- -- 17* 18* -- -- -- -- -- --  GLUCOSE 213* -- -- 223* 190* -- -- -- -- -- --  BUN 37* -- -- 46* 45* -- -- -- -- -- --  CREATININE 1.33 -- -- 1.64* 1.62* -- -- -- -- -- --  CALCIUM 6.9* -- -- 7.5* 7.3* -- -- -- -- -- --  MG 2.2 -- -- 2.6* -- -- 2.6* -- -- -- --  PHOS 4.9* -- -- 7.5* 7.1* -- -- -- -- -- --  AST 87* -- -- 77* -- -- -- -- -- 87* --  ALT 16 -- -- 17 -- -- -- -- -- 34 --  ALKPHOS 343* -- -- 216* -- -- -- -- -- 207* --  BILITOT 3.6* -- -- 3.5* -- -- -- -- -- 1.8* --  PROT 5.8* -- -- 6.1 -- -- -- -- -- 5.9* --  ALBUMIN 1.8* -- -- 1.9* 1.9* -- -- -- -- -- --  APTT 30 -- 34 -- -- 51* -- -- -- -- --  INR 1.13 -- 1.21 -- -- 3.96* -- -- -- -- --  LATICACIDVEN -- -- -- -- -- -- -- -- -- -- --  TROPONINI -- -- -- -- -- -- -- -- -- -- --  PROCALCITON -- -- -- -- -- -- -- -- -- -- 5.71  PROBNP -- -- -- -- -- -- -- -- -- -- --  O2SATVEN -- -- -- -- -- -- -- -- -- -- --  PHART -- 7.285* -- -- -- -- -- 7.304* 7.385 -- --  PCO2ART -- 37.7 -- -- -- -- -- 42.6 35.4 -- --  PO2ART -- 82.9 -- -- -- -- -- 71.0* 90.0 -- --    Lab 08/21/12 0759 08/21/12 0346 08/20/12 2319 08/20/12 2003 08/20/12 1557  GLUCAP 145* 184* 191* 154* 161*   Lipase     Component Value Date/Time   LIPASE 466* 08/20/2012 0800    Intake/Output Summary (Last 24 hours) at 08/21/12 0935 Last data filed at 08/21/12 0900  Gross per 24 hour  Intake 3338.57 ml  Output   4956 ml  Net -1617.43 ml   IMAGING: PCXR: 11/21>>>Slight decrease in size of rightloculated pleural effusion. Bibasilar predominant opacities favored to represent atelectasis  ECG: ST  DIAGNOSES: Principal Problem:  *Pancreatitis, alcoholic, acute Active Problems:  HTN (hypertension)   Alcohol abuse  Tobacco abuse  HCAP (healthcare-associated pneumonia)  ARF (acute renal failure)  Hyperkalemia  Hyponatremia  Hernia, inguinal  Cholelithiases  Leukocytosis  Pancreatic pseudocyst/cyst  Lower extremity pain, bilateral  Candidal dermatitis  Dehydration  Acute respiratory failure with hypoxia  Alcoholic liver damage  Nonspecific elevation of  levels of transaminase or lactic acid dehydrogenase (LDH)  ASSESSMENT / PLAN:  PULMONARY  ASSESSMENT: Acute resp failure Large right pleural effusion Bibasilar right greater than left atelectasis Mildly more acidotic ABG today.  Rt effusion  PLAN:   No SBT MV too high to wean   CARDIOVASCULAR  ASSESSMENT:  Systemic inflammatory response syndrome/Sirs and sepsis: in the setting of pancreatitis with evolving pseudocyst. potential sources: being infected pseudocyst, lower extremity cellulitis. Also possible aspiration versus hospital associated pneumonia. Vaso and Levo requirements stable from 11/21.  Lower extremity edema: Bilateral likely reflects cellulitis.  LE venous doppler on 11/15 negative for DVT.   PLAN:  Continue Norepi and vaso for BP support with negative fluid balance CVPs q4 hours. Keep vasopressin and titrate Levo as possible.  Maintain stress steroids Treat source infection?  RENAL  ASSESSMENT:   Acute renal failure Probably multi-factorial but hepato-renal syndrome seems to be most likely.   Metabolic acidosis: improved with CVVHD at negative 100 ml/hr. Hyperkalemia PLAN:    CVVHD per renal -CVVHD removal negative 100 ml/hr, negative 2.3L on 11/21 [facility] IVF  Renal dose medications. Renal following.  GASTROINTESTINAL  ASSESSMENT:   Acute pancreatitis with pseudocyst: still in marked abdominal discomfort, progressive ascites, worsening abdominal distention.Lipase peak 2500, 466 11/22 Incarcerated left inguinal hernia: surgery planned for future Cholelithiasis: no evidence of  cholecystitis Abnormal LFTs in the setting of alcoholic hepatitis, AST and ALT trending downward.  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  Follow LFTs GI PX ordered Drain placed in IR, amylase/lipase positive, cultures NGTD  Pre iCU nutritional status poor,  , TPN started 11/20   HEMATOLOGIC  ASSESSMENT: Hemodilution related anemia: no current evidence of blood loss Coagulopathy: Mild, probably related to alcohol hepatitis Thrombocytopenia: stable today.  Heparin stopped with CVVHD and Proph doses.   Rising WBC 29.7 -->  49.1 Auto anticoagulation PLAN:  HIT panel in process, stop heparin, suspicion moderate PT INR 3.96, PTT 51.  Will send Factor 8 level today.  Transfuse for hemoglobin less than 7  Rpt Vit K x 1 Coags in am   INFECTIOUS  ASSESSMENT:   Sirs/sepsis: Multiple potential sources as follows: Infected pseudocyst, SBP, HCAP vs aspiration pneumonia and lower extremity cellulitis. Possible candida dermatitis both lower extremities, [place] negative Rising wbc, worsening clinical status,Ascites & rt effusion on CT, UOP still minimal so no UA ordered. Lines appear clean.  PLAN:   Continue Imipenem, mycofungin, ensure dose adjusted liver dz Drain is in pseudocysts linazolid, at risk VRE and lack of progress, follow plat closely Repeat BC pending  UA if urine noted If pos BC, change line chk pct  ENDOCRINE  ASSESSMENT:   Hyperglycemia on Stress steroids.   PLAN:   Cont CBG SSI as needed  NEUROLOGIC  ASSESSMENT:   Acute encephalopathy: Likely in the setting of sepsis. Additionally concerned about alcohol withdrawal  PLAN:   PRN sedation only very minimal sedation currently.  Avoid benzo WUA  CLINICAL SUMMARY:  Multiorgan failure with inability to correct K & acidosis on CVVH - Discussion of code status with family in light of worsening clinical status.  They will consider care lmitations  Oretha Milch., MD  08/21/2012 9:35 AM   Cyril Mourning MD. FCCP. [place] [facility] Pager [phone] If no response call [phone]

## 2012-08-22 ENCOUNTER — Inpatient Hospital Stay (HOSPITAL_COMMUNITY): Payer: Medicare Other

## 2012-08-22 LAB — CBC
HCT: 38.4 % — ABNORMAL LOW (ref 39.0–52.0)
Hemoglobin: 13.1 g/dL (ref 13.0–17.0)
MCH: 35.3 pg — ABNORMAL HIGH (ref 26.0–34.0)
MCHC: 34.1 g/dL (ref 30.0–36.0)
MCV: 103.5 fL — ABNORMAL HIGH (ref 78.0–100.0)
RDW: 17.5 % — ABNORMAL HIGH (ref 11.5–15.5)

## 2012-08-22 LAB — PROTIME-INR: INR: 1.23 (ref 0.00–1.49)

## 2012-08-22 LAB — PROCALCITONIN: Procalcitonin: 5.33 ng/mL

## 2012-08-22 LAB — RENAL FUNCTION PANEL
CO2: 32 mEq/L (ref 19–32)
Calcium: 7 mg/dL — ABNORMAL LOW (ref 8.4–10.5)
GFR calc Af Amer: 65 mL/min — ABNORMAL LOW (ref >90)
GFR calc non Af Amer: 56 mL/min — ABNORMAL LOW (ref >90)
Potassium: 3.4 mEq/L — ABNORMAL LOW (ref 3.5–5.1)
Sodium: 131 mEq/L — ABNORMAL LOW (ref 135–145)

## 2012-08-22 LAB — GLUCOSE, CAPILLARY: Glucose-Capillary: 202 mg/dL — ABNORMAL HIGH (ref 70–99)

## 2012-08-22 LAB — CLOSTRIDIUM DIFFICILE BY PCR: Toxigenic C. Difficile by PCR: NEGATIVE

## 2012-08-22 MED ORDER — MORPHINE BOLUS VIA INFUSION
5.0000 mg | INTRAVENOUS | Status: DC | PRN
  Filled 2012-08-22: qty 20

## 2012-08-22 MED ORDER — SODIUM CHLORIDE 0.9 % IV SOLN
500.0000 mg | Freq: Three times a day (TID) | INTRAVENOUS | Status: DC
  Filled 2012-08-22 (×3): qty 500

## 2012-08-22 MED ORDER — HEPARIN SODIUM (PORCINE) 1000 UNIT/ML DIALYSIS
1000.0000 [IU] | INTRAMUSCULAR | Status: DC | PRN
  Filled 2012-08-22: qty 6

## 2012-08-22 MED ORDER — INSULIN REGULAR HUMAN 100 UNIT/ML IJ SOLN
INTRAMUSCULAR | Status: DC
  Filled 2012-08-22: qty 2000

## 2012-08-22 MED ORDER — PRISMASOL BGK 4/2.5 32-4-2.5 MEQ/L IV SOLN
INTRAVENOUS | Status: DC
  Administered 2012-08-22: 11:00:00 via INTRAVENOUS_CENTRAL
  Filled 2012-08-22 (×2): qty 5000

## 2012-08-22 MED ORDER — MORPHINE SULFATE 25 MG/ML IV SOLN
10.0000 mg/h | INTRAVENOUS | Status: DC
  Administered 2012-08-22: 10 mg/h via INTRAVENOUS
  Filled 2012-08-22: qty 10

## 2012-08-22 MED ORDER — PRISMASOL BGK 4/2.5 32-4-2.5 MEQ/L IV SOLN
INTRAVENOUS | Status: DC
  Administered 2012-08-22: 12:00:00 via INTRAVENOUS_CENTRAL
  Filled 2012-08-22 (×2): qty 5000

## 2012-08-22 MED ORDER — DEXTROSE 50 % IV SOLN
INTRAVENOUS | Status: AC
  Administered 2012-08-22: 25 mL
  Filled 2012-08-22: qty 50

## 2012-08-22 MED ORDER — PRISMASOL BGK 4/2.5 32-4-2.5 MEQ/L IV SOLN
INTRAVENOUS | Status: DC
  Administered 2012-08-22: 10:00:00 via INTRAVENOUS_CENTRAL
  Filled 2012-08-22 (×9): qty 5000

## 2012-08-23 LAB — HEPARIN INDUCED THROMBOCYTOPENIA PNL
Heparin Induced Plt Ab: NEGATIVE
Patient O.D.: 0.18
UFH High Dose UFH H: 14 % Release

## 2012-08-25 LAB — CULTURE, BLOOD (ROUTINE X 2)

## 2012-08-29 NOTE — Progress Notes (Signed)
Subjective: Intubated. Sedation currently off  Objective: Vital signs in last 24 hours: Temp:  [96.2 F (35.7 C)-97.8 F (36.6 C)] 96.2 F (35.7 C) (11/24 0800) Pulse Rate:  [90-117] 90  (11/24 0700) Resp:  [16-37] 17  (11/24 0800) BP: (73-152)/(50-96) 105/72 mmHg (11/24 0700) SpO2:  [94 %-100 %] 98 % (11/24 0822) Arterial Line BP: (55-171)/(35-81) 119/64 mmHg (11/24 0800) FiO2 (%):  [40 %] 40 % (11/24 0823) Weight:  [173 lb 1 oz (78.5 kg)] 173 lb 1 oz (78.5 kg) (11/24 0500) Last BM Date: 08/21/12  Intake/Output from previous day: 11/23 0701 - 11/24 0700 In: 3185.1 [I.V.:549; IV Piggyback:1150; TPN:1451.2] Out: 5839 [Urine:4; Emesis/NG output:180; Drains:25] Intake/Output this shift: Total I/O In: -  Out: 183 [Other:183]  GI: soft but distended. quiet  Lab Results:   Basename 08/17/2012 0400 08/21/12 0423  WBC 43.9* 48.9*  HGB 13.1 13.3  HCT 38.4* 37.7*  PLT 39* 60*   BMET  Basename 08/03/2012 0400 08/21/12 1735  NA 131* 130*  K 3.4* 4.9  CL 81* 83*  CO2 32 30  GLUCOSE 192* 235*  BUN 34* 38*  CREATININE 1.28 1.35  CALCIUM 7.0* 6.6*   PT/INR  Basename 08/08/2012 0400 08/21/12 0423  LABPROT 15.3* 14.3  INR 1.23 1.13   ABG  Basename 08/20/12 0930  PHART 7.285*  HCO3 17.5*    Studies/Results: Ct Abdomen Pelvis Wo Contrast  08/20/2012  *RADIOLOGY REPORT*  Clinical Data:  Evaluate for loculated pleural effusion or abscess.  CT CHEST, ABDOMEN AND PELVIS WITHOUT CONTRAST  Technique:  Multidetector CT imaging of the chest, abdomen and pelvis was performed following the standard protocol without IV contrast.  Comparison:  CT of the abdomen and pelvis 08/07/2012.  CT CHEST  Findings:  Mediastinum: The patient is intubated with the tip of the endotracheal tube just below the level of the thoracic inlet. Nasogastric tube extends just beyond the gastroesophageal junction into the stomach.  Right upper extremity PICC terminates at the superior cavoatrial junction.   Left internal jugular central venous catheter tip terminates proximal superior vena cava. Heart size is normal. Trace amount of pericardial fluid and/or thickening, unlikely to be of hemodynamic significance at this time. No pathologically enlarged mediastinal or hilar lymph nodes. Please note that accurate exclusion of hilar adenopathy is limited on noncontrast CT scans. There is atherosclerosis of the thoracic aorta, the great vessels of the mediastinum and the coronary arteries, including calcified atherosclerotic plaque in the left anterior descending coronary arteries. Esophagus is unremarkable in appearance.  Lungs/Pleura: Large right pleural effusion layers dependently. Trace left pleural effusion.  There is extensive passive atelectasis involving nearly the entire right lower lobe and a portion of the left lower lobe.  No definite consolidative airspace disease.  Musculoskeletal: There are no aggressive appearing lytic or blastic lesions noted in the visualized portions of the skeleton.  IMPRESSION:  1.  Large right and small left pleural effusions appear to layer dependently, and are associated with passive atelectasis in the lower lobes (near complete right lower lobe atelectasis and a small amount of left lower lobe atelectasis). 2. Atherosclerosis, including left anterior descending coronary artery disease. 3.  Support apparatus, as above.  CT ABDOMEN AND PELVIS  Findings:  Abdomen/Pelvis: Compared to the prior examination there has been interval placement of a pigtail drainage catheter in the right upper quadrant of the abdomen in the previously noted right-sided subcapsular fluid collection which has nearly completely resolved. The left sided subcapsular fluid collection associated with the  liver is similar in size, as is a small collection medial to the caudate lobe.  The liver has a shrunken appearance and nodular contour, compatible with cirrhosis.  High attenuation material is again noted within  the lumen of the gallbladder, compatible with biliary sludge and/or small gallstones.  Calcifications in the pancreatic parenchyma, predominately in the head and uncinate process, likely reflect sequelae of chronic pancreatitis.  Small low attenuation lesion associated with the proximal body of the pancreas is similar to the prior study measuring approximate 2.3 cm, likely represent a small pseudocyst (technically not characterized).  The unenhanced appearance of the spleen, bilateral adrenal glands and bilateral kidneys is unremarkable.  Moderate volume of ascites has increased compared to the prior study, and does not appear to be obviously loculated.  No pneumoperitoneum.  No pathologic distension of small bowel. Diffuse stranding throughout the omentum and mesentery.  Foley balloon catheter within the lumen of the urinary bladder which is completely decompressed. Extensive atherosclerosis throughout the abdominal and pelvic vasculature, without definite aneurysm.  Musculoskeletal: There are no aggressive appearing lytic or blastic lesions noted in the visualized portions of the skeleton.  IMPRESSION:  1.  The recently placed pigtail drainage catheter appears to be successfully draining the subcapsular fluid collection over the right lobe of the liver. 2.  Increased moderate volume of ascites compared to the prior study and increase in omental and mesenteric edema.  The possibility of malignant peritoneal disease and malignant ascites is not excluded.  Clinical correlation is recommended. 3.  Diffuse body wall edema has significantly increased compared to the prior study.  The overall appearance suggests a state of anasarca. 4.  Cirrhosis. 5.  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 6.  Additional incidental findings, as above, similar to prior examination.   Original Report Authenticated By: Trudie Reed, M.D.    Ct Chest  Wo Contrast  08/20/2012  *RADIOLOGY REPORT*  Clinical Data:  Evaluate for loculated pleural effusion or abscess.  CT CHEST, ABDOMEN AND PELVIS WITHOUT CONTRAST  Technique:  Multidetector CT imaging of the chest, abdomen and pelvis was performed following the standard protocol without IV contrast.  Comparison:  CT of the abdomen and pelvis 08/07/2012.  CT CHEST  Findings:  Mediastinum: The patient is intubated with the tip of the endotracheal tube just below the level of the thoracic inlet. Nasogastric tube extends just beyond the gastroesophageal junction into the stomach.  Right upper extremity PICC terminates at the superior cavoatrial junction.  Left internal jugular central venous catheter tip terminates proximal superior vena cava. Heart size is normal. Trace amount of pericardial fluid and/or thickening, unlikely to be of hemodynamic significance at this time. No pathologically enlarged mediastinal or hilar lymph nodes. Please note that accurate exclusion of hilar adenopathy is limited on noncontrast CT scans. There is atherosclerosis of the thoracic aorta, the great vessels of the mediastinum and the coronary arteries, including calcified atherosclerotic plaque in the left anterior descending coronary arteries. Esophagus is unremarkable in appearance.  Lungs/Pleura: Large right pleural effusion layers dependently. Trace left pleural effusion.  There is extensive passive atelectasis involving nearly the entire right lower lobe and a portion of the left lower lobe.  No definite consolidative airspace disease.  Musculoskeletal: There are no aggressive appearing lytic or blastic lesions noted in the visualized portions of the skeleton.  IMPRESSION:  1.  Large right and small left pleural effusions appear to layer dependently,  and are associated with passive atelectasis in the lower lobes (near complete right lower lobe atelectasis and a small amount of left lower lobe atelectasis). 2. Atherosclerosis, including  left anterior descending coronary artery disease. 3.  Support apparatus, as above.  CT ABDOMEN AND PELVIS  Findings:  Abdomen/Pelvis: Compared to the prior examination there has been interval placement of a pigtail drainage catheter in the right upper quadrant of the abdomen in the previously noted right-sided subcapsular fluid collection which has nearly completely resolved. The left sided subcapsular fluid collection associated with the liver is similar in size, as is a small collection medial to the caudate lobe.  The liver has a shrunken appearance and nodular contour, compatible with cirrhosis.  High attenuation material is again noted within the lumen of the gallbladder, compatible with biliary sludge and/or small gallstones.  Calcifications in the pancreatic parenchyma, predominately in the head and uncinate process, likely reflect sequelae of chronic pancreatitis.  Small low attenuation lesion associated with the proximal body of the pancreas is similar to the prior study measuring approximate 2.3 cm, likely represent a small pseudocyst (technically not characterized).  The unenhanced appearance of the spleen, bilateral adrenal glands and bilateral kidneys is unremarkable.  Moderate volume of ascites has increased compared to the prior study, and does not appear to be obviously loculated.  No pneumoperitoneum.  No pathologic distension of small bowel. Diffuse stranding throughout the omentum and mesentery.  Foley balloon catheter within the lumen of the urinary bladder which is completely decompressed. Extensive atherosclerosis throughout the abdominal and pelvic vasculature, without definite aneurysm.  Musculoskeletal: There are no aggressive appearing lytic or blastic lesions noted in the visualized portions of the skeleton.  IMPRESSION:  1.  The recently placed pigtail drainage catheter appears to be successfully draining the subcapsular fluid collection over the right lobe of the liver. 2.  Increased  moderate volume of ascites compared to the prior study and increase in omental and mesenteric edema.  The possibility of malignant peritoneal disease and malignant ascites is not excluded.  Clinical correlation is recommended. 3.  Diffuse body wall edema has significantly increased compared to the prior study.  The overall appearance suggests a state of anasarca. 4.  Cirrhosis. 5.  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 6.  Additional incidental findings, as above, similar to prior examination.   Original Report Authenticated By: Trudie Reed, M.D.    Dg Chest Port 1 View  08/04/2012  *RADIOLOGY REPORT*  Clinical Data: Ventilatory support, respiratory failure, right pleural effusion  PORTABLE CHEST - 1 VIEW  Comparison: 08/21/2012  Findings: Stable support apparatus.  Monitor leads overlie the chest.  Moderately large right effusion noted extends over the lung apex.  There is associated right lung compressive atelectasis. Minimal left base atelectasis medially.  No enlarging left effusion.  No pneumothorax.  IMPRESSION: No change in moderately large right pleural effusion.  Stable support apparatus.   Original Report Authenticated By: Judie Petit. Miles Costain, M.D.    Dg Chest Port 1 View  08/21/2012  *RADIOLOGY REPORT*  Clinical Data: Endotracheal tube placement.  PORTABLE CHEST - 1 VIEW  Comparison: 08/20/2012  Findings: Endotracheal tube remains in place with tip about 4.3 cm above the carina.  Enteric tube tip is not visible but is below the left hemidiaphragm consistent with location at least in the stomach.  Left central venous catheter is unchanged in position with tip projected over the mediastinum, likely  at the origin of the brachial cephalic vein.  A right PICC line is present with tip over the mid SVC region.  No pneumothorax.  Normal heart size and pulmonary vascularity.  Right pleural effusion with infiltration in the  right lung base.  IMPRESSION: Appliances positioned as described.  Persistent right pleural effusion and right basilar infiltration.   Original Report Authenticated By: Burman Nieves, M.D.     Anti-infectives: Anti-infectives     Start     Dose/Rate Route Frequency Ordered Stop   01-Sep-2012 1400   imipenem-cilastatin (PRIMAXIN) 500 mg in sodium chloride 0.9 % 100 mL IVPB        500 mg 200 mL/hr over 30 Minutes Intravenous 3 times per day 09-01-2012 0834     08/19/12 1330   linezolid (ZYVOX) IVPB 600 mg        600 mg 300 mL/hr over 60 Minutes Intravenous Every 12 hours 08/19/12 1309     08/18/12 1845   imipenem-cilastatin (PRIMAXIN) 500 mg in sodium chloride 0.9 % 100 mL IVPB  Status:  Discontinued        500 mg 200 mL/hr over 30 Minutes Intravenous 4 times per day 08/18/12 1838 September 01, 2012 0834   08/17/12 0900   vancomycin (VANCOCIN) IVPB 1000 mg/200 mL premix  Status:  Discontinued        1,000 mg 200 mL/hr over 60 Minutes Intravenous Every 24 hours 08/17/12 0730 08/19/12 1308   08/16/12 1200   micafungin (MYCAMINE) 100 mg in sodium chloride 0.9 % 100 mL IVPB        100 mg 100 mL/hr over 1 Hours Intravenous Daily 08/16/12 1146     08/15/12 1300   imipenem-cilastatin (PRIMAXIN) 250 mg in sodium chloride 0.9 % 100 mL IVPB  Status:  Discontinued        250 mg 200 mL/hr over 30 Minutes Intravenous 4 times per day 08/15/12 1119 08/18/12 1838   08/13/12 1400   vancomycin (VANCOCIN) 750 mg in sodium chloride 0.9 % 150 mL IVPB  Status:  Discontinued        750 mg 150 mL/hr over 60 Minutes Intravenous Every 12 hours 08/13/12 1254 08/15/12 1115   08/13/12 1400   imipenem-cilastatin (PRIMAXIN) 500 mg in sodium chloride 0.9 % 100 mL IVPB  Status:  Discontinued        500 mg 200 mL/hr over 30 Minutes Intravenous 3 times per day 08/13/12 1254 08/15/12 1119   08/12/12 1700   levofloxacin (LEVAQUIN) IVPB 750 mg  Status:  Discontinued        750 mg 100 mL/hr over 90 Minutes Intravenous Every 48  hours 08/02/2012 1749 08/12/12 1130   08/12/12 0600   vancomycin (VANCOCIN) IVPB 1000 mg/200 mL premix  Status:  Discontinued        1,000 mg 200 mL/hr over 60 Minutes Intravenous Every 24 hours 08/11/12 1141 08/12/12 1130   08/24/2012 2200   piperacillin-tazobactam (ZOSYN) IVPB 3.375 g  Status:  Discontinued        3.375 g 12.5 mL/hr over 240 Minutes Intravenous Every 8 hours 08/09/2012 1749 08/12/12 1130   08/17/2012 1800   vancomycin (VANCOCIN) 750 mg in sodium chloride 0.9 % 150 mL IVPB  Status:  Discontinued        750 mg 150 mL/hr over 60 Minutes Intravenous Every 12 hours 08/21/2012 1749 08/11/12 1141   08/02/2012 1530   vancomycin (VANCOCIN) IVPB 1000 mg/200 mL premix  Status:  Discontinued  1,000 mg 200 mL/hr over 60 Minutes Intravenous  Once 08/24/2012 1528 07/31/2012 1808   08/21/2012 1530   piperacillin-tazobactam (ZOSYN) IVPB 3.375 g        3.375 g 100 mL/hr over 30 Minutes Intravenous  Once 08/07/2012 1528 08/13/2012 1630   08/02/2012 1530   levofloxacin (LEVAQUIN) IVPB 750 mg  Status:  Discontinued        750 mg 100 mL/hr over 90 Minutes Intravenous  Once 08/09/2012 1528 08/11/2012 1808          Assessment/Plan: s/p * No surgery found * Continue abx and drain for now. wbc improving but still 43K Pancreatitis and Cirrhosis. Prognosis is grave CVVH per renal Critical care per CCM  LOS: 12 days    TOTH III,PAUL S 09/17/2012

## 2012-08-29 NOTE — Progress Notes (Signed)
Post-mortem care completed.

## 2012-08-29 NOTE — Progress Notes (Signed)
ANTIBIOTIC CONSULT NOTE - FOLLOW UP  Pharmacy Consult for imipenem, micafungin Indication: rule out pneumonia, rule out sepsis, pancreatitis, cellulitis, candida  No Known Allergies  Patient Measurements: Height: 5\' 4"  (162.6 cm) Weight: 173 lb 1 oz (78.5 kg) IBW/kg (Calculated) : 59.2    Vital Signs: Temp: 97.5 F (36.4 C) (11/24 0400) Temp src: Oral (11/24 0400) BP: 105/72 mmHg (11/24 0700) Pulse Rate: 90  (11/24 0700)  Assessment: 66 yo male on Primaxin/Linezolid/Micafungin for multiple infections including possible HCAP, pancreatitis with evolving pseudocyst, as well as lower extremity cellulitis. He also has candida albicans growth in the respiratory culture. He is currently on CVVHDF, and also with hepatic impairment d/t chronic EtOH use. He is afebrile, WBC has increased from to 44. Watery diarrhea noted last night, and C-diff PCR is pending. Noted pt. plt continue dropping, 39k today. Pt. is on linezolid, which could cause thrombocytopenia, but it was started after his plt started dropping. Heparin d/c'd, HIT panel pending.   IImipenem 11/15> Vanc 11/12 >>11/14>> 11/15>11/18>>11/21 Zosyn 11/12 >>11/15 Levaquin 11/12 >> 11/13 Micafungin 11/18>> Linezolid 11/21>>  11/13 urine: neg  11/12 bld x2>>> neg 11/16 resp>>candida albicans 11/19 abscess >> neg 11/19 pleural fluid >> neg 11/21 bld >>  Plan:  Change Primaxin to 500 mg IV q8h (Hepatic dysfunction may further impair cilastatin Clearance in patients CRRT) Continue micafungin 100 mg IV q24h Monitor renal function and duration of CVVHDF  Bayard Hugger, PharmD, BCPS  Clinical Pharmacist  Pager: [phone]   08/25/2012 7:36 AM

## 2012-08-29 NOTE — Progress Notes (Signed)
Subjective: Perihepatic/ pseudocyst drain placed 11/19 On vent; no response  Objective: Vital signs in last 24 hours: Temp:  [96.2 F (35.7 C)-97.8 F (36.6 C)] 96.2 F (35.7 C) (11/24 0800) Pulse Rate:  [90-117] 90  (11/24 0700) Resp:  [16-37] 17  (11/24 0800) BP: (73-152)/(50-96) 105/72 mmHg (11/24 0700) SpO2:  [94 %-100 %] 98 % (11/24 0822) Arterial Line BP: (55-171)/(35-81) 119/64 mmHg (11/24 0800) FiO2 (%):  [40 %] 40 % (11/24 0823) Weight:  [173 lb 1 oz (78.5 kg)] 173 lb 1 oz (78.5 kg) (11/24 0500) Last BM Date: 08/21/12  Intake/Output from previous day: 11/23 0701 - 11/24 0700 In: 3185.1 [I.V.:549; IV Piggyback:1150; TPN:1451.2] Out: 5839 [Urine:4; Emesis/NG output:180; Drains:25] Intake/Output this shift: Total I/O In: -  Out: 183 [Other:183]  PE:  Afeb (96.2) Wbc spike: 43.9 today (48.9) Drain intact Output 25 cc yesterday: no growth CT 11/22: collection better; more ascites    Lab Results:   Basename 08/08/2012 0400 08/21/12 0423  WBC 43.9* 48.9*  HGB 13.1 13.3  HCT 38.4* 37.7*  PLT 39* 60*   BMET  Basename 08/04/2012 0400 08/21/12 1735  NA 131* 130*  K 3.4* 4.9  CL 81* 83*  CO2 32 30  GLUCOSE 192* 235*  BUN 34* 38*  CREATININE 1.28 1.35  CALCIUM 7.0* 6.6*   PT/INR  Basename 08/24/2012 0400 08/21/12 0423  LABPROT 15.3* 14.3  INR 1.23 1.13   ABG  Basename 08/20/12 0930  PHART 7.285*  HCO3 17.5*    Studies/Results: Ct Abdomen Pelvis Wo Contrast  08/20/2012  *RADIOLOGY REPORT*  Clinical Data:  Evaluate for loculated pleural effusion or abscess.  CT CHEST, ABDOMEN AND PELVIS WITHOUT CONTRAST  Technique:  Multidetector CT imaging of the chest, abdomen and pelvis was performed following the standard protocol without IV contrast.  Comparison:  CT of the abdomen and pelvis August 27, 2012.  CT CHEST  Findings:  Mediastinum: The patient is intubated with the tip of the endotracheal tube just below the level of the thoracic inlet. Nasogastric tube  extends just beyond the gastroesophageal junction into the stomach.  Right upper extremity PICC terminates at the superior cavoatrial junction.  Left internal jugular central venous catheter tip terminates proximal superior vena cava. Heart size is normal. Trace amount of pericardial fluid and/or thickening, unlikely to be of hemodynamic significance at this time. No pathologically enlarged mediastinal or hilar lymph nodes. Please note that accurate exclusion of hilar adenopathy is limited on noncontrast CT scans. There is atherosclerosis of the thoracic aorta, the great vessels of the mediastinum and the coronary arteries, including calcified atherosclerotic plaque in the left anterior descending coronary arteries. Esophagus is unremarkable in appearance.  Lungs/Pleura: Large right pleural effusion layers dependently. Trace left pleural effusion.  There is extensive passive atelectasis involving nearly the entire right lower lobe and a portion of the left lower lobe.  No definite consolidative airspace disease.  Musculoskeletal: There are no aggressive appearing lytic or blastic lesions noted in the visualized portions of the skeleton.  IMPRESSION:  1.  Large right and small left pleural effusions appear to layer dependently, and are associated with passive atelectasis in the lower lobes (near complete right lower lobe atelectasis and a small amount of left lower lobe atelectasis). 2. Atherosclerosis, including left anterior descending coronary artery disease. 3.  Support apparatus, as above.  CT ABDOMEN AND PELVIS  Findings:  Abdomen/Pelvis: Compared to the prior examination there has been interval placement of a pigtail drainage catheter in the right upper quadrant  of the abdomen in the previously noted right-sided subcapsular fluid collection which has nearly completely resolved. The left sided subcapsular fluid collection associated with the liver is similar in size, as is a small collection medial to the  caudate lobe.  The liver has a shrunken appearance and nodular contour, compatible with cirrhosis.  High attenuation material is again noted within the lumen of the gallbladder, compatible with biliary sludge and/or small gallstones.  Calcifications in the pancreatic parenchyma, predominately in the head and uncinate process, likely reflect sequelae of chronic pancreatitis.  Small low attenuation lesion associated with the proximal body of the pancreas is similar to the prior study measuring approximate 2.3 cm, likely represent a small pseudocyst (technically not characterized).  The unenhanced appearance of the spleen, bilateral adrenal glands and bilateral kidneys is unremarkable.  Moderate volume of ascites has increased compared to the prior study, and does not appear to be obviously loculated.  No pneumoperitoneum.  No pathologic distension of small bowel. Diffuse stranding throughout the omentum and mesentery.  Foley balloon catheter within the lumen of the urinary bladder which is completely decompressed. Extensive atherosclerosis throughout the abdominal and pelvic vasculature, without definite aneurysm.  Musculoskeletal: There are no aggressive appearing lytic or blastic lesions noted in the visualized portions of the skeleton.  IMPRESSION:  1.  The recently placed pigtail drainage catheter appears to be successfully draining the subcapsular fluid collection over the right lobe of the liver. 2.  Increased moderate volume of ascites compared to the prior study and increase in omental and mesenteric edema.  The possibility of malignant peritoneal disease and malignant ascites is not excluded.  Clinical correlation is recommended. 3.  Diffuse body wall edema has significantly increased compared to the prior study.  The overall appearance suggests a state of anasarca. 4.  Cirrhosis. 5.  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  6.  Additional incidental findings, as above, similar to prior examination.   Original Report Authenticated By: Trudie Reed, M.D.    Ct Chest Wo Contrast  08/20/2012  *RADIOLOGY REPORT*  Clinical Data:  Evaluate for loculated pleural effusion or abscess.  CT CHEST, ABDOMEN AND PELVIS WITHOUT CONTRAST  Technique:  Multidetector CT imaging of the chest, abdomen and pelvis was performed following the standard protocol without IV contrast.  Comparison:  CT of the abdomen and pelvis 2012/08/11.  CT CHEST  Findings:  Mediastinum: The patient is intubated with the tip of the endotracheal tube just below the level of the thoracic inlet. Nasogastric tube extends just beyond the gastroesophageal junction into the stomach.  Right upper extremity PICC terminates at the superior cavoatrial junction.  Left internal jugular central venous catheter tip terminates proximal superior vena cava. Heart size is normal. Trace amount of pericardial fluid and/or thickening, unlikely to be of hemodynamic significance at this time. No pathologically enlarged mediastinal or hilar lymph nodes. Please note that accurate exclusion of hilar adenopathy is limited on noncontrast CT scans. There is atherosclerosis of the thoracic aorta, the great vessels of the mediastinum and the coronary arteries, including calcified atherosclerotic plaque in the left anterior descending coronary arteries. Esophagus is unremarkable in appearance.  Lungs/Pleura: Large right pleural effusion layers dependently. Trace left pleural effusion.  There is extensive passive atelectasis involving nearly the entire right lower lobe and a portion of the left lower lobe.  No definite consolidative airspace disease.  Musculoskeletal: There are no aggressive appearing lytic or blastic  lesions noted in the visualized portions of the skeleton.  IMPRESSION:  1.  Large right and small left pleural effusions appear to layer dependently, and are associated with passive  atelectasis in the lower lobes (near complete right lower lobe atelectasis and a small amount of left lower lobe atelectasis). 2. Atherosclerosis, including left anterior descending coronary artery disease. 3.  Support apparatus, as above.  CT ABDOMEN AND PELVIS  Findings:  Abdomen/Pelvis: Compared to the prior examination there has been interval placement of a pigtail drainage catheter in the right upper quadrant of the abdomen in the previously noted right-sided subcapsular fluid collection which has nearly completely resolved. The left sided subcapsular fluid collection associated with the liver is similar in size, as is a small collection medial to the caudate lobe.  The liver has a shrunken appearance and nodular contour, compatible with cirrhosis.  High attenuation material is again noted within the lumen of the gallbladder, compatible with biliary sludge and/or small gallstones.  Calcifications in the pancreatic parenchyma, predominately in the head and uncinate process, likely reflect sequelae of chronic pancreatitis.  Small low attenuation lesion associated with the proximal body of the pancreas is similar to the prior study measuring approximate 2.3 cm, likely represent a small pseudocyst (technically not characterized).  The unenhanced appearance of the spleen, bilateral adrenal glands and bilateral kidneys is unremarkable.  Moderate volume of ascites has increased compared to the prior study, and does not appear to be obviously loculated.  No pneumoperitoneum.  No pathologic distension of small bowel. Diffuse stranding throughout the omentum and mesentery.  Foley balloon catheter within the lumen of the urinary bladder which is completely decompressed. Extensive atherosclerosis throughout the abdominal and pelvic vasculature, without definite aneurysm.  Musculoskeletal: There are no aggressive appearing lytic or blastic lesions noted in the visualized portions of the skeleton.  IMPRESSION:  1.  The  recently placed pigtail drainage catheter appears to be successfully draining the subcapsular fluid collection over the right lobe of the liver. 2.  Increased moderate volume of ascites compared to the prior study and increase in omental and mesenteric edema.  The possibility of malignant peritoneal disease and malignant ascites is not excluded.  Clinical correlation is recommended. 3.  Diffuse body wall edema has significantly increased compared to the prior study.  The overall appearance suggests a state of anasarca. 4.  Cirrhosis. 5.  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 6.  Additional incidental findings, as above, similar to prior examination.   Original Report Authenticated By: Trudie Reed, M.D.    Dg Chest Port 1 View  08/17/2012  *RADIOLOGY REPORT*  Clinical Data: Ventilatory support, respiratory failure, right pleural effusion  PORTABLE CHEST - 1 VIEW  Comparison: 08/21/2012  Findings: Stable support apparatus.  Monitor leads overlie the chest.  Moderately large right effusion noted extends over the lung apex.  There is associated right lung compressive atelectasis. Minimal left base atelectasis medially.  No enlarging left effusion.  No pneumothorax.  IMPRESSION: No change in moderately large right pleural effusion.  Stable support apparatus.   Original Report Authenticated By: Judie Petit. Miles Costain, M.D.    Dg Chest Port 1 View  08/21/2012  *RADIOLOGY REPORT*  Clinical Data: Endotracheal tube placement.  PORTABLE CHEST - 1 VIEW  Comparison: 08/20/2012  Findings: Endotracheal tube remains in place with tip about 4.3 cm above the carina.  Enteric tube tip is not visible but is below the left  hemidiaphragm consistent with location at least in the stomach.  Left central venous catheter is unchanged in position with tip projected over the mediastinum, likely at the origin of the brachial cephalic vein.  A right PICC line  is present with tip over the mid SVC region.  No pneumothorax.  Normal heart size and pulmonary vascularity.  Right pleural effusion with infiltration in the right lung base.  IMPRESSION: Appliances positioned as described.  Persistent right pleural effusion and right basilar infiltration.   Original Report Authenticated By: Burman Nieves, M.D.     Anti-infectives:   Assessment/Plan: s/p * No surgery found *  Perihepatic/pseudocyst drain placed 11/19 Will follow Plan primary MD    LOS: 12 days    Gary [name] A 08/14/2012

## 2012-08-29 NOTE — Progress Notes (Signed)
No heart beats and no resps heard over 90 seconds .E-link Dr made aware  That Pt has expired .Family at bedside and informed.

## 2012-08-29 NOTE — Progress Notes (Signed)
Patient ID: Gary [name], male   DOB: 10-17-1945, 66 y.o.   MRN: [id number]   [facility] Progress Note    Subjective:   No acute changes overnight, remains unresponsive off sedation. From critical care notes, appears that family has now started thinking regarding withdrawal of care.    Objective:   BP 105/72  Pulse 90  Temp 96.2 F (35.7 C) (Axillary)  Resp 17  Ht 5\' 4"  (1.626 m)  Wt 78.5 kg (173 lb 1 oz)  BMI 29.71 kg/m2  SpO2 98%  Intake/Output Summary (Last 24 hours) at 09/12/2012 0904 Last data filed at Sep 12, 2012 0800  Gross per 24 hour  Intake 3015.72 ml  Output   5659 ml  Net -2643.28 ml   Weight change: -1.7 kg (-3 lb 12 oz)  Physical Exam: Gen: Unresponsive on the ventilator. Pupils bilaterally sluggish to light CVS: Pulse regular in rate and rhythm, heart sounds S1 and S2 normal Resp: Coarse breath sounds bilaterally, no rales/rhonchi Abd: Soft, obese, nontender, drain in situ Ext: 1-2+ edema over upper and lower extremities  Imaging: Ct Abdomen Pelvis Wo Contrast  08/20/2012  *RADIOLOGY REPORT*  Clinical Data:  Evaluate for loculated pleural effusion or abscess.  CT CHEST, ABDOMEN AND PELVIS WITHOUT CONTRAST  Technique:  Multidetector CT imaging of the chest, abdomen and pelvis was performed following the standard protocol without IV contrast.  Comparison:  CT of the abdomen and pelvis 08/05/2012.  CT CHEST  Findings:  Mediastinum: The patient is intubated with the tip of the endotracheal tube just below the level of the thoracic inlet. Nasogastric tube extends just beyond the gastroesophageal junction into the stomach.  Right upper extremity PICC terminates at the superior cavoatrial junction.  Left internal jugular central venous catheter tip terminates proximal superior vena cava. Heart size is normal. Trace amount of pericardial fluid and/or thickening, unlikely to be of hemodynamic significance at this time. No pathologically enlarged mediastinal or  hilar lymph nodes. Please note that accurate exclusion of hilar adenopathy is limited on noncontrast CT scans. There is atherosclerosis of the thoracic aorta, the great vessels of the mediastinum and the coronary arteries, including calcified atherosclerotic plaque in the left anterior descending coronary arteries. Esophagus is unremarkable in appearance.  Lungs/Pleura: Large right pleural effusion layers dependently. Trace left pleural effusion.  There is extensive passive atelectasis involving nearly the entire right lower lobe and a portion of the left lower lobe.  No definite consolidative airspace disease.  Musculoskeletal: There are no aggressive appearing lytic or blastic lesions noted in the visualized portions of the skeleton.  IMPRESSION:  1.  Large right and small left pleural effusions appear to layer dependently, and are associated with passive atelectasis in the lower lobes (near complete right lower lobe atelectasis and a small amount of left lower lobe atelectasis). 2. Atherosclerosis, including left anterior descending coronary artery disease. 3.  Support apparatus, as above.  CT ABDOMEN AND PELVIS  Findings:  Abdomen/Pelvis: Compared to the prior examination there has been interval placement of a pigtail drainage catheter in the right upper quadrant of the abdomen in the previously noted right-sided subcapsular fluid collection which has nearly completely resolved. The left sided subcapsular fluid collection associated with the liver is similar in size, as is a small collection medial to the caudate lobe.  The liver has a shrunken appearance and nodular contour, compatible with cirrhosis.  High attenuation material is again noted within the lumen of the gallbladder, compatible with biliary sludge and/or small  gallstones.  Calcifications in the pancreatic parenchyma, predominately in the head and uncinate process, likely reflect sequelae of chronic pancreatitis.  Small low attenuation lesion  associated with the proximal body of the pancreas is similar to the prior study measuring approximate 2.3 cm, likely represent a small pseudocyst (technically not characterized).  The unenhanced appearance of the spleen, bilateral adrenal glands and bilateral kidneys is unremarkable.  Moderate volume of ascites has increased compared to the prior study, and does not appear to be obviously loculated.  No pneumoperitoneum.  No pathologic distension of small bowel. Diffuse stranding throughout the omentum and mesentery.  Foley balloon catheter within the lumen of the urinary bladder which is completely decompressed. Extensive atherosclerosis throughout the abdominal and pelvic vasculature, without definite aneurysm.  Musculoskeletal: There are no aggressive appearing lytic or blastic lesions noted in the visualized portions of the skeleton.  IMPRESSION:  1.  The recently placed pigtail drainage catheter appears to be successfully draining the subcapsular fluid collection over the right lobe of the liver. 2.  Increased moderate volume of ascites compared to the prior study and increase in omental and mesenteric edema.  The possibility of malignant peritoneal disease and malignant ascites is not excluded.  Clinical correlation is recommended. 3.  Diffuse body wall edema has significantly increased compared to the prior study.  The overall appearance suggests a state of anasarca. 4.  Cirrhosis. 5.  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 6.  Additional incidental findings, as above, similar to prior examination.   Original Report Authenticated By: Trudie Reed, M.D.    Ct Chest Wo Contrast  08/20/2012  *RADIOLOGY REPORT*  Clinical Data:  Evaluate for loculated pleural effusion or abscess.  CT CHEST, ABDOMEN AND PELVIS WITHOUT CONTRAST  Technique:  Multidetector CT imaging of the chest, abdomen and pelvis was performed following  the standard protocol without IV contrast.  Comparison:  CT of the abdomen and pelvis 18-Aug-2012.  CT CHEST  Findings:  Mediastinum: The patient is intubated with the tip of the endotracheal tube just below the level of the thoracic inlet. Nasogastric tube extends just beyond the gastroesophageal junction into the stomach.  Right upper extremity PICC terminates at the superior cavoatrial junction.  Left internal jugular central venous catheter tip terminates proximal superior vena cava. Heart size is normal. Trace amount of pericardial fluid and/or thickening, unlikely to be of hemodynamic significance at this time. No pathologically enlarged mediastinal or hilar lymph nodes. Please note that accurate exclusion of hilar adenopathy is limited on noncontrast CT scans. There is atherosclerosis of the thoracic aorta, the great vessels of the mediastinum and the coronary arteries, including calcified atherosclerotic plaque in the left anterior descending coronary arteries. Esophagus is unremarkable in appearance.  Lungs/Pleura: Large right pleural effusion layers dependently. Trace left pleural effusion.  There is extensive passive atelectasis involving nearly the entire right lower lobe and a portion of the left lower lobe.  No definite consolidative airspace disease.  Musculoskeletal: There are no aggressive appearing lytic or blastic lesions noted in the visualized portions of the skeleton.  IMPRESSION:  1.  Large right and small left pleural effusions appear to layer dependently, and are associated with passive atelectasis in the lower lobes (near complete right lower lobe atelectasis and a small amount of left lower lobe atelectasis). 2. Atherosclerosis, including left anterior descending coronary artery disease. 3.  Support apparatus, as above.  CT ABDOMEN AND PELVIS  Findings:  Abdomen/Pelvis: Compared to the prior examination there has been interval placement of a pigtail drainage catheter in the right upper  quadrant of the abdomen in the previously noted right-sided subcapsular fluid collection which has nearly completely resolved. The left sided subcapsular fluid collection associated with the liver is similar in size, as is a small collection medial to the caudate lobe.  The liver has a shrunken appearance and nodular contour, compatible with cirrhosis.  High attenuation material is again noted within the lumen of the gallbladder, compatible with biliary sludge and/or small gallstones.  Calcifications in the pancreatic parenchyma, predominately in the head and uncinate process, likely reflect sequelae of chronic pancreatitis.  Small low attenuation lesion associated with the proximal body of the pancreas is similar to the prior study measuring approximate 2.3 cm, likely represent a small pseudocyst (technically not characterized).  The unenhanced appearance of the spleen, bilateral adrenal glands and bilateral kidneys is unremarkable.  Moderate volume of ascites has increased compared to the prior study, and does not appear to be obviously loculated.  No pneumoperitoneum.  No pathologic distension of small bowel. Diffuse stranding throughout the omentum and mesentery.  Foley balloon catheter within the lumen of the urinary bladder which is completely decompressed. Extensive atherosclerosis throughout the abdominal and pelvic vasculature, without definite aneurysm.  Musculoskeletal: There are no aggressive appearing lytic or blastic lesions noted in the visualized portions of the skeleton.  IMPRESSION:  1.  The recently placed pigtail drainage catheter appears to be successfully draining the subcapsular fluid collection over the right lobe of the liver. 2.  Increased moderate volume of ascites compared to the prior study and increase in omental and mesenteric edema.  The possibility of malignant peritoneal disease and malignant ascites is not excluded.  Clinical correlation is recommended. 3.  Diffuse body wall edema  has significantly increased compared to the prior study.  The overall appearance suggests a state of anasarca. 4.  Cirrhosis. 5.  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 6.  Additional incidental findings, as above, similar to prior examination.   Original Report Authenticated By: Trudie Reed, M.D.    Dg Chest Port 1 View  08/13/2012  *RADIOLOGY REPORT*  Clinical Data: Ventilatory support, respiratory failure, right pleural effusion  PORTABLE CHEST - 1 VIEW  Comparison: 08/21/2012  Findings: Stable support apparatus.  Monitor leads overlie the chest.  Moderately large right effusion noted extends over the lung apex.  There is associated right lung compressive atelectasis. Minimal left base atelectasis medially.  No enlarging left effusion.  No pneumothorax.  IMPRESSION: No change in moderately large right pleural effusion.  Stable support apparatus.   Original Report Authenticated By: Judie Petit. Miles Costain, M.D.    Dg Chest Port 1 View  08/21/2012  *RADIOLOGY REPORT*  Clinical Data: Endotracheal tube placement.  PORTABLE CHEST - 1 VIEW  Comparison: 08/20/2012  Findings: Endotracheal tube remains in place with tip about 4.3 cm above the carina.  Enteric tube tip is not visible but is below the left hemidiaphragm consistent with location at least in the stomach.  Left central venous catheter is unchanged in position with tip projected over the mediastinum, likely at the origin of the brachial cephalic vein.  A right PICC line is present with tip over the mid SVC region.  No pneumothorax.  Normal heart size and pulmonary vascularity.  Right pleural effusion with infiltration in the right lung base.  IMPRESSION: Appliances positioned as  described.  Persistent right pleural effusion and right basilar infiltration.   Original Report Authenticated By: Burman Nieves, M.D.     Labs: BMET  Lab 08-25-12 0400 08/21/12 1735 08/21/12  0423 08/20/12 0439 08/19/12 1600 08/19/12 1200 08/19/12 0420 08/18/12 0507  NA 131* 130* 133* 129* 133* 133* 136 --  K 3.4* 4.9 5.2* 5.1 5.5* 5.5* 5.4* --  CL 81* 83* 90* 97 99 99 100 --  CO2 32 30 28 17* 18* 17* 21 --  GLUCOSE 192* 235* 213* 223* 190* 155* 139* --  BUN 34* 38* 37* 46* 45* 47* 45* --  CREATININE 1.28 1.35 1.33 1.64* 1.62* 1.72* 1.71* --  ALB -- -- -- -- -- -- -- --  CALCIUM 7.0* 6.6* 6.9* 7.5* 7.3* 7.3* 7.0* --  PHOS 5.1* 5.0* 4.9* 7.5* 7.1* -- 6.5* 6.0*   CBC  Lab 08/25/2012 0400 08/21/12 0423 08/20/12 0439 08/19/12 0420 08/17/12 0411  WBC 43.9* 48.9* 49.1* 42.7* --  NEUTROABS -- 44.5* 46.6* -- 27.6*  HGB 13.1 13.3 13.2 13.1 --  HCT 38.4* 37.7* 39.1 37.9* --  MCV 103.5* 102.2* 103.2* 102.4* --  PLT 39* 60* 64* 69* --    Medications:      . albuterol  6 puff Inhalation Q6H  . antiseptic oral rinse  15 mL Mouth Rinse QID  . chlorhexidine  15 mL Mouth/Throat BID  . [COMPLETED] dextrose      . hydrocortisone sod succinate (SOLU-CORTEF) injection  50 mg Intravenous Q6H  . imipenem-cilastatin  500 mg Intravenous Q8H  . insulin aspart  0-9 Units Subcutaneous Q4H  . ketoconazole  1 application Topical BID  . linezolid  600 mg Intravenous Q12H  . micafungin (MYCAMINE) IV  100 mg Intravenous Daily  . pantoprazole (PROTONIX) IV  40 mg Intravenous Q24H  . [COMPLETED] phytonadione (VITAMIN K) IV  10 mg Intravenous Once  . sodium chloride  10-40 mL Intracatheter Q12H  . sodium chloride irrigation  5 mL Irrigation Q8H  . [DISCONTINUED] imipenem-cilastatin  500 mg Intravenous Q6H     Assessment/ Plan:   1. ARF, oliguric- most consistent with ischemic ATN with the differentials of AIN/intra-abdominal hypertension. Continues to remain anuric and fully dependent on clearance from CR RT-at this point appearing to be an intervention in futility. Unfortunately, appears to be actively dying in spite of multi-organ support. Continue to favor adoption of a palliative care/comfort  care approach.  2. VDRF- per PCCM, No indications of improvement/recovery noted- discussions have been initiated with the family regarding poor prognosis and withdrawal of care.  3. SIRS secondary to pancreatitis/incarcerated abdominal hernia- on low dose pressors and CRRT support as above, on multiple pressors. Off BSABx. Ongoing close evaluation by surgery.  4. Pleural Effusion- S/P thoracentesis- studies pending  5. Ascites with H/O cirrhosis-   Zetta Bills, MD 2012/08/25, 9:04 AM

## 2012-08-29 NOTE — Progress Notes (Signed)
Resps  unlabored .Family members voiced that they feel Pt is resting well .

## 2012-08-29 NOTE — Progress Notes (Signed)
PULMONARY  / CRITICAL CARE MEDICINE  Name: Gary [name] MRN: [id number] DOB: 1946-04-20    LOS: 12  REFERRING MD :  [name]  CHIEF COMPLAINT: encephalopathy, respiratory insufficiency  BRIEF PATIENT DESCRIPTION:  66 year old white male admitted 11/12 for abdominal pain in the setting of acute pancreatitis an incarcerated left inguinal hernia. Initially admitted to medicine service. Critical care asked to see on 11/15 for encephalopathy, and concern for progressive respiratory distress and inability to protect airway.  LINES / TUBES: RUE PICC line 11/15>>> L IJ HD Cath 11/17>>> R Rad Aline 11/16>>>  CULTURES: 11/16 Resp >>>CANDIDA ALBICANS 11/13 urine culture: Negative 11/12 blood culture x2>>>Negative 11/21 BC>>>ng 11/22 stool c diff >>  ANTIBIOTICS: Levofloxacin 11/14>>>11/14>1 dose. Zosyn 11/12>>>11/14 Primaxin 11/15>>> Vancomycin 11/12>>>11/14>>> 11/15>>>11/20 Micafungin 11/18>>> Linezolid 11/20 -->  SIGNIFICANT EVENTS:  CT abdomen 11/12: Enlargement of. Pancreatic fluid collection with significant mass effect on both right and left lobe of liver. Suggesting enlarging pseudocysts. Nonspecific nodularity edema of the mesentery with no focal mass. Stable left inguinal hernia. Abdominal ultrasound 11/12: gallstone with echogenic sludge, but no evidence of biliary obstruction or acute cholecystitis. Increased ascites compared to CT 7 days prior fluid around the liver with mass effect. ECHO 11/12: Systolic function was normal. The estimated ejection fraction was in the range of 50% to 55%. There was an increased relative contribution of atrial contraction to ventricular filling.  Bilateral LE ultrasound on11/15>>>>no DVT Bedside Abd U/S: no pockets of fluid, minimal ascites  CT chest/ abdomen 11/22 >>large rt effusion, increased ascites, pigtail drainage catheter appears to be  successfully draining the subcapsular fluid collection over the right lobe of the liver.   LEVEL  OF CARE:  ICU PRIMARY SERVICE:  PCCM CONSULTANTS:  IR, Gen Surg, nephro CODE STATUS full code DIET:  TPN DVT Px:   GI Px: Protonix  SUBJ : reminas unresponsive off sedation x 24h,  on pressors  VITAL SIGNS: Temp:  [96.2 F (35.7 C)-97.8 F (36.6 C)] 96.2 F (35.7 C) (11/24 0800) Pulse Rate:  [90-117] 90  (11/24 0700) Resp:  [16-37] 17  (11/24 0800) BP: (73-152)/(50-96) 105/72 mmHg (11/24 0700) SpO2:  [94 %-100 %] 98 % (11/24 0822) Arterial Line BP: (55-171)/(35-81) 119/64 mmHg (11/24 0800) FiO2 (%):  [40 %] 40 % (11/24 0823) Weight:  [78.5 kg (173 lb 1 oz)] 78.5 kg (173 lb 1 oz) (11/24 0500)  HEMODYNAMICS: CVP:  [4 mmHg-5 mmHg] 4 mmHg  VENTILATOR SETTINGS: Vent Mode:  [-] PRVC FiO2 (%):  [40 %] 40 % Set Rate:  [30 bmp] 30 bmp Vt Set:  [470 mL] 470 mL PEEP:  [5 cmH20] 5 cmH20 Plateau Pressure:  [10 cmH20-20 cmH20] 10 cmH20  INTAKE / OUTPUT: Intake/Output      11/23 0701 - 11/24 0700 11/24 0701 - 11/25 0700   I.V. (mL/kg) 549 (7)    Other 35    NG/GT     IV Piggyback 1150    TPN 1451.2    Total Intake(mL/kg) 3185.1 (40.6)    Urine (mL/kg/hr) 4 (0)    Emesis/NG output 180    Drains 25    Other 5630 380   Total Output 5839 380   Net -2653.9 -380        Stool Occurrence 3 x     PHYSICAL EXAMINATION: General:  Chronically ill-appearing 66 year old male appears older than stated age.  Neuro: intubated.  unresponsive HEENT:  PERRL, no JVD. Cardiovascular:  No murmur rub or gallop. RRR Lungs: minimal scattered Rhonchi, no  wheezes, or rales Abdomen:  Mildly Distended, firm, generalized discomfort to palpation. Hypoactive bowel sounds. Musculoskeletal:  3+ pitting edema in all 4 extremities.  Withdraws to pain.  Skin:  Lower extremities are both swollen and edematous with blisters noted,  Erythema around the ankles. Cool to touch, withdraws in pain with gentle palpation bilaterally, cap refill 6 seconds bilaterally.  Pulses difficult to doppler with bandages and  edema. Popliteals + by doppler  LABS:  Lab 08/03/2012 0400 08/21/12 1735 08/21/12 1030 08/21/12 0423 08/20/12 0930 08/20/12 0900 08/20/12 0439 08/19/12 1329 08/19/12 0336 08/18/12 1038 08/16/12 0930  HGB 13.1 -- -- 13.3 -- -- 13.2 -- -- -- --  WBC 43.9* -- -- 48.9* -- -- 49.1* -- -- -- --  PLT 39* -- -- 60* -- -- 64* -- -- -- --  NA 131* 130* -- 133* -- -- -- -- -- -- --  K 3.4* 4.9 -- -- -- -- -- -- -- -- --  CL 81* 83* -- 90* -- -- -- -- -- -- --  CO2 32 30 -- 28 -- -- -- -- -- -- --  GLUCOSE 192* 235* -- 213* -- -- -- -- -- -- --  BUN 34* 38* -- 37* -- -- -- -- -- -- --  CREATININE 1.28 1.35 -- 1.33 -- -- -- -- -- -- --  CALCIUM 7.0* 6.6* -- 6.9* -- -- -- -- -- -- --  MG 2.2 -- -- 2.2 -- -- 2.6* -- -- -- --  PHOS 5.1* 5.0* -- 4.9* -- -- -- -- -- -- --  AST -- -- -- 87* -- -- 77* -- -- -- 87*  ALT -- -- -- 16 -- -- 17 -- -- -- 34  ALKPHOS -- -- -- 343* -- -- 216* -- -- -- 207*  BILITOT -- -- -- 3.6* -- -- 3.5* -- -- -- 1.8*  PROT -- -- -- 5.8* -- -- 6.1 -- -- -- 5.9*  ALBUMIN 1.6* 1.6* -- 1.8* -- -- -- -- -- -- --  APTT -- -- -- 30 -- 34 -- 51* -- -- --  INR 1.23 -- -- 1.13 -- 1.21 -- -- -- -- --  LATICACIDVEN -- -- -- -- -- -- -- -- -- -- --  TROPONINI -- -- -- -- -- -- -- -- -- -- --  PROCALCITON 5.33 -- 6.05 -- -- -- -- -- -- -- --  PROBNP -- -- -- -- -- -- -- -- -- -- --  O2SATVEN -- -- -- -- -- -- -- -- -- -- --  PHART -- -- -- -- 7.285* -- -- -- 7.304* 7.385 --  PCO2ART -- -- -- -- 37.7 -- -- -- 42.6 35.4 --  PO2ART -- -- -- -- 82.9 -- -- -- 71.0* 90.0 --    Lab 08/12/2012 0353 08/21/12 2346 08/21/12 1957 08/21/12 1604 08/21/12 1229  GLUCAP 202* 207* 162* 186* 121*   Lipase     Component Value Date/Time   LIPASE 466* 08/20/2012 0800    Intake/Output Summary (Last 24 hours) at 08/19/2012 0926 Last data filed at 08/15/2012 0900  Gross per 24 hour  Intake 3015.72 ml  Output   5856 ml  Net -2840.28 ml   IMAGING: PCXR: 11/24>>>large rt  effusion     DIAGNOSES: Principal Problem:  *Pancreatitis, alcoholic, acute Active Problems:  HTN (hypertension)  Alcohol abuse  Tobacco abuse  HCAP (healthcare-associated pneumonia)  ARF (acute renal failure)  Hyperkalemia  Hyponatremia  Hernia, inguinal  Cholelithiases  Leukocytosis  Pancreatic pseudocyst/cyst  Lower extremity pain, bilateral  Candidal dermatitis  Dehydration  Acute respiratory failure with hypoxia  Alcoholic liver damage  Nonspecific elevation of levels of transaminase or lactic acid dehydrogenase (LDH)  ASSESSMENT / PLAN:  PULMONARY  ASSESSMENT: Acute resp failure Large right pleural effusion Bibasilar right greater than left atelectasis Rt effusion  PLAN:   No SBT MV too high to wean Doubt thoracentesis of any benefit here   CARDIOVASCULAR  ASSESSMENT:  Systemic inflammatory response syndrome/Sirs and sepsis: in the setting of pancreatitis with evolving pseudocyst. potential sources: being infected pseudocyst, lower extremity cellulitis. Also possible aspiration versus hospital associated pneumonia.  Levo requirements slight decrease 11/24 Lower extremity edema: Bilateral likely reflects cellulitis.  LE venous doppler on 11/15 negative for DVT.   PLAN:  CVP low Keep vasopressin and titrate Levo as possible  with even fluid balance Maintain stress steroids   RENAL  ASSESSMENT:   Acute renal failure Probably multi-factorial but hepato-renal syndrome seems to be most likely.   Metabolic acidosis: improved with CVVHD at negative 100 ml/hr. Hyperkalemia PLAN:   CVVHD per renal -CVVHD removal negative 100 ml/hr, goal even balance while on pressors KVO IVF  Renal dose medications. Renal following.  GASTROINTESTINAL  ASSESSMENT:   Acute pancreatitis with pseudocyst:  progressive ascites, worsening abdominal distention.Lipase peak 2500, 466 11/22 Incarcerated left inguinal hernia: surgery planned for future Cholelithiasis: no  evidence of cholecystitis Abnormal LFTs in the setting of alcoholic hepatitis, AST and ALT trending downward.  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  Follow LFTs GI PX ordered Drain placed in IR, amylase/lipase positive, cultures NGTD  Pre iCU nutritional status poor,  , TPN started 11/20, once lipase down- consider post pyloric TFs   HEMATOLOGIC  ASSESSMENT: Hemodilution related anemia: no current evidence of blood loss Coagulopathy: Mild, probably related to alcohol hepatitis Thrombocytopenia: stable today.  Heparin stopped with CVVHD and Proph doses.   Rising WBC 29.7 -->  49.1 Auto anticoagulation PLAN:  HIT panel in process, stop heparin, suspicion moderate coags improved with vit K Transfuse for hemoglobin less than 7   INFECTIOUS  ASSESSMENT:   Sirs/sepsis: Multiple potential sources as follows: Infected pseudocyst, SBP, HCAP vs aspiration pneumonia and lower extremity cellulitis. Possible candida dermatitis both lower extremities, [place] negative Rising wbc, worsening clinical status,Ascites & rt effusion on CT, UOP still minimal so no UA ordered. Lines appear clean. Pct improving PLAN:   Continue Imipenem, mycofungin, ensure dose adjusted liver dz Drain is in pseudocysts linazolid, at risk VRE and lack of progress, follow plat closely Simplify abx once off pressors  ENDOCRINE  ASSESSMENT:   Hyperglycemia on Stress steroids.   PLAN:   Cont CBG SSI as needed  NEUROLOGIC  ASSESSMENT:   Acute encephalopathy: Likely in the setting of sepsis. Additionally concerned about alcohol withdrawal -not waking up off drips x 24h PLAN:   PRN sedation only  Avoid benzo, dc drips   CLINICAL SUMMARY:  Multiorgan failure on CVVH - 11/23 Discussion of code status with family in light of worsening clinical status. They will consider care limitations. Extreme leucocytosis  ? Infection vs steroids   Care during the described time interval was provided by me and/or other providers on the critical care team.  I have reviewed this patient's available data, including medical history, events of note, physical examination and test results as part of my evaluation  CC time x  31m ALVA,RAKESH V., MD  08/12/2012 9:26 AM   Cyril Mourning MD. FCCP. [place] [facility] Pager [phone] If no response call [phone]

## 2012-08-29 NOTE — Progress Notes (Signed)
PARENTERAL NUTRITION CONSULT NOTE   Pharmacy Consult for TPN Indication: severe pancreatitis, presumed pancreatic fistula  No Known Allergies  Patient Measurements: Height: 5\' 4"  (162.6 cm) Weight: 173 lb 1 oz (78.5 kg) IBW/kg (Calculated) : 59.2   Vital Signs: Temp: 97.5 F (36.4 C) (11/24 0400) Temp src: Oral (11/24 0400) BP: 105/72 mmHg (11/24 0700) Pulse Rate: 90  (11/24 0700) Intake/Output from previous day: 11/23 0701 - 11/24 0700 In: 3185.1 [I.V.:549; IV Piggyback:1150; TPN:1451.2] Out: 5839 [Urine:4; Emesis/NG output:180; Drains:25] Intake/Output from this shift:    Labs:  Basename 09/10/2012 0400 08/21/12 0423 08/20/12 0900 08/20/12 0439 08/19/12 1329  WBC 43.9* 48.9* -- 49.1* --  HGB 13.1 13.3 -- 13.2 --  HCT 38.4* 37.7* -- 39.1 --  PLT 39* 60* -- 64* --  APTT -- 30 34 -- 51*  INR 1.23 1.13 1.21 -- --     Basename 09-10-12 0400 08/21/12 1735 08/21/12 0423 08/20/12 0439  NA 131* 130* 133* --  K 3.4* 4.9 5.2* --  CL 81* 83* 90* --  CO2 32 30 28 --  GLUCOSE 192* 235* 213* --  BUN 34* 38* 37* --  CREATININE 1.28 1.35 1.33 --  LABCREA -- -- -- --  CREAT24HRUR -- -- -- --  CALCIUM 7.0* 6.6* 6.9* --  MG 2.2 -- 2.2 2.6*  PHOS 5.1* 5.0* 4.9* --  PROT -- -- 5.8* 6.1  ALBUMIN 1.6* 1.6* 1.8* --  AST -- -- 87* 77*  ALT -- -- 16 17  ALKPHOS -- -- 343* 216*  BILITOT -- -- 3.6* 3.5*  BILIDIR -- -- -- --  IBILI -- -- -- --  PREALBUMIN -- -- -- 4.0*  TRIG -- -- -- 107  CHOLHDL -- -- -- --  CHOL -- -- -- 72   Estimated Creatinine Clearance: 53 ml/min (by C-G formula based on Cr of 1.28).    Basename 09-10-2012 0353 08/21/12 2346 08/21/12 1957  GLUCAP 202* 207* 162*    Medical History: Past Medical History  Diagnosis Date  . Hypertension   . GERD (gastroesophageal reflux disease)   . Pneumonia     "5-7 years ago" (08/03/2012)  . Exertional dyspnea     "just recently" (08/03/2012)  . Delirium, acute 08/03/2012  . Prostate cancer     Medications:    Prescriptions prior to admission  Medication Sig Dispense Refill  . aspirin EC 81 MG tablet Take 162 mg by mouth daily.      . cholecalciferol (VITAMIN D) 1000 UNITS tablet Take 1,000 Units by mouth daily.      . ciprofloxacin (CIPRO) 500 MG tablet Take 1 tablet (500 mg total) by mouth 2 (two) times daily.  10 tablet  0  . ciprofloxacin (CIPRO) 500 MG tablet Take 500 mg by mouth 2 (two) times daily. For 10 days starting on 08/07/12      . feeding supplement (ENSURE COMPLETE) LIQD Take 237 mLs by mouth 2 (two) times daily between meals.  60 Bottle  0  . folic acid (FOLVITE) 1 MG tablet Take 1 tablet (1 mg total) by mouth daily.  30 tablet  0  . guaiFENesin-dextromethorphan (ROBITUSSIN DM) 100-10 MG/5ML syrup Take 5 mLs by mouth every 4 (four) hours as needed for cough.  118 mL    . lisinopril (PRINIVIL,ZESTRIL) 10 MG tablet Take 10 mg by mouth daily.      . metroNIDAZOLE (FLAGYL) 500 MG tablet Take 500 mg by mouth 3 (three) times daily. For 10 days starting on  08/07/12      . Multiple Vitamin (MULTIVITAMIN WITH MINERALS) TABS Take 1 tablet by mouth daily.  30 tablet  0  . omeprazole (PRILOSEC) 20 MG capsule Take 20 mg by mouth daily.      Marland Kitchen thiamine 100 MG tablet Take 1 tablet (100 mg total) by mouth daily.  30 tablet  0  . vitamin E 400 UNIT capsule Take 800 Units by mouth daily.        Insulin Requirements in the past 24 hours:  11 units SSI  Current Nutrition:  NPO  Assessment:  66 yom admitted on 11/12 with generalized weakness, fatigue and abdominal pain d/t acute pancreatitis and hernia. He has a history of HTN, alcohol abuse, prostate cancer and GERD. Developed encephalopthy and respiratory distress. 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 - elevated amylase/lipase (increasing) - on PPI, watery diarrhea noted Endo: No hx DM - CBGs slightly elevated 121-207, also  on stress steroids Lytes:  K+3.4,Phos 5.1, Mg 1.6 Renal: Continues on CRRT - Scr improving to 1.28, no UOP documented Pulm: 40% Fio2 Cards: Hx HTN - BP 105/72, HR 90- norepi + vasopressin gtt Hepatobil: AST slightly elevated, Tbili elevated at 3.6 INR 1.23, given vit k 10mg  IV x 1 11/21 and 11/23 Neuro: Sedated on fent/versed ID: Linezolid + micafungin + primaxin for ?PNA, pancreatitis, cellulitis - Afebrile, WBC 43.9 (To clarify, zyvox is not a MAO inhibitor but can cause increases in BP) Best Practices: Heparin SQ dc'd, no SCDs ordered, PPI TPN Access: PICC TPN day#: 4  Nutritional Goals:  1900-2000 kCal, 118-130 grams of protein per day.  Goal rate Clinimix 5/15 at 100 ml/hr and lipids MWF will meet 100% estimated needs  Plan:  1. Increase Clinimix 5/15 to 43ml/hr - no electrolytes d/t CRRT and elevated lytes 2.  Will defer K supplementation to renal.   3.  Will add small amount of insulin to TPN. 4. Lipids, MVI + trace elements MWF only d/t national shortage 5. F/u AM labs + CBGs 6. F/u clinical progression, CRRT plans  Talbert Cage Poteet 08/16/2012,7:04 AM

## 2012-08-29 NOTE — Progress Notes (Signed)
eLink Physician-Brief Progress Note Patient Name: Gary [name] DOB: 03-Jul-1946 MRN: [id number]  Date of Service  Sep 04, 2012   HPI/Events of Note   Watery diarrhea   eICU Interventions  Flexi seal and C. Diff ordered      [facility] 09/04/2012, 1:01 AM

## 2012-08-29 NOTE — Progress Notes (Signed)
[facility] PCCM  The family explained to Dr. Allena Katz earlier that they want to withdraw support.  I have verified this with the family and have written orders for withdrawal of care.  Yolonda Kida PCCM Pager: [phone] Cell: [phone] If no response, call [phone]

## 2012-08-29 NOTE — Procedures (Signed)
Extubation Procedure Note  Patient Details:   Name: Gary [name] DOB: 11-May-1946 MRN: [id number]   Terminally extubated per MD order, pt appears comfortable, no distress, RT to monitor. Evaluation2  O2 sats: transiently fell during during procedure Complications: No apparent complications Patient did not tolerate procedure well. Bilateral Breath Sounds: Diminished Suctioning: Airway No  Harley Hallmark 09-19-12, 3:12 PM

## 2012-08-29 DEATH — deceased

## 2012-08-30 NOTE — Discharge Summary (Signed)
PULMONARY  / CRITICAL CARE MEDICINE DEATH SUMMARY   Name: Gary [name] MRN: [id number] DOB: 04/11/1946    LOS: 12   BRIEF PATIENT DESCRIPTION:  66 year old white male admitted 11/12 for abdominal pain in the setting of acute pancreatitis an incarcerated left inguinal hernia. Initially admitted to medicine service. Critical care asked to see on 11/15 for encephalopathy, and concern for progressive respiratory distress and inability to protect airway.  LINES / TUBES: RUE PICC line 11/15>>> L IJ HD Cath 11/17>>> R Rad Aline 11/16>>>  CULTURES: 11/16 Resp >>>CANDIDA ALBICANS 11/13 urine culture: Negative 11/12 blood culture x2>>>Negative 11/21 BC>>>ng 11/22 stool c diff >>neg  ANTIBIOTICS: Levofloxacin 11/14>>>11/14>1 dose. Zosyn 11/12>>>11/14 Primaxin 11/15>>> Vancomycin 11/12>>>11/14>>> 11/15>>>11/20 Micafungin 11/18>>> Linezolid 11/20 -->  SIGNIFICANT EVENTS/ COURSE :  CT abdomen 11/12: Enlargement of. Pancreatic fluid collection with significant mass effect on both right and left lobe of liver. Suggesting enlarging pseudocysts. Nonspecific nodularity edema of the mesentery with no focal mass. Stable left inguinal hernia. Abdominal ultrasound 11/12: gallstone with echogenic sludge, but no evidence of biliary obstruction or acute cholecystitis. Increased ascites compared to CT 7 days prior fluid around the liver with mass effect. ECHO 11/12: Systolic function was normal. The estimated ejection fraction was in the range of 50% to 55%. There was an increased relative contribution of atrial contraction to ventricular filling.  Bilateral LE ultrasound on11/15>>>>no DVT Bedside Abd U/S: no pockets of fluid, minimal ascites  CT chest/ abdomen 11/22 >>large rt effusion, increased ascites, pigtail drainage catheter appears to be successfully draining the subcapsular fluid collection over the right lobe of the liver.    PULMONARY  ASSESSMENT: Acute resp failure Large right  pleural effusion Bibasilar right greater than left atelectasis Rt effusion   CARDIOVASCULAR  ASSESSMENT:  Systemic inflammatory response syndrome/Sirs and sepsis: in the setting of pancreatitis with evolving pseudocyst. potential sources: being infected pseudocyst, lower extremity cellulitis. Also possible aspiration versus hospital associated pneumonia.  Levo requirements slight decrease 11/24 Lower extremity edema: Bilateral likely reflects cellulitis.  LE venous doppler on 11/15 negative for DVT.   PLAN:  Required  vasopressin and Levo  Maintain stress steroids   RENAL  ASSESSMENT:   Acute renal failure Probably multi-factorial but hepato-renal syndrome seems to be most likely.   Metabolic acidosis: improved with CVVHD at negative 100 ml/hr. Hyperkalemia PLAN:   CVVHD per renal Renal following.  GASTROINTESTINAL  ASSESSMENT:   Acute pancreatitis with pseudocyst:  progressive ascites, worsening abdominal distention.Lipase peak 2500, 466 11/22 Incarcerated left inguinal hernia: surgery planned for future Cholelithiasis: no evidence of cholecystitis Abnormal LFTs in the setting of alcoholic hepatitis, AST and ALT trending downward.  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  Drain placed in IR, amylase/lipase positive, cultures NGTD  Pre iCU nutritional status poor,  , TPN started 11/20   HEMATOLOGIC  ASSESSMENT: Hemodilution related anemia: no current evidence of blood loss Coagulopathy: Mild, probably related to alcohol hepatitis Thrombocytopenia: Heparin stopped with CVVHD and Proph doses.   Rising WBC 29.7 -->  49.1 Auto anticoagulation PLAN:  HIT panel in process, stop heparin, suspicion moderate coags improved with vit K   INFECTIOUS  ASSESSMENT:   Sirs/sepsis: Multiple potential sources as follows: Infected pseudocyst, SBP, HCAP vs aspiration  pneumonia and lower extremity cellulitis. Possible candida dermatitis both lower extremities, [place] negative Rising wbc, worsening clinical status,Ascites & rt effusion on CT, UOP still minimal so no UA ordered.  Lines appear clean. Pct improving PLAN:   Continue Imipenem, mycofungin Drain is in pseudocysts linazolid, at risk VRE and lack of progress, follow plat closely   ENDOCRINE  ASSESSMENT:   Hyperglycemia on Stress steroids.   PLAN:   SSI as needed  NEUROLOGIC  ASSESSMENT:   Acute encephalopathy: Likely in the setting of sepsis. Additionally concerned about alcohol withdrawal -not waking up off drips x 24h PLAN:   PRN sedation only  Avoid benzo, dc drips   CLINICAL SUMMARY:  Multiorgan failure on CVVH - 11/23 Discussion of code status with family in light of worsening clinical status. Extreme leucocytosis ? Infection vs steroids Mechanical ventialtion was withdrawn on 2023-09-17 & he passed away soon after  Cause of death - Acute pancreatitis & multi organ failure    Oretha Milch., MD  08/30/2012 10:03 AM

## 2013-06-07 IMAGING — CR DG ABD PORTABLE 2V
3 series · 3 of 3 positions shown · non-contrast
Comparison: 08/10/2012 CT.

CLINICAL DATA: Abdominal pain.  Pancreatitis.  Question ileus.

PORTABLE ABDOMEN - 2 VIEW

[AP]
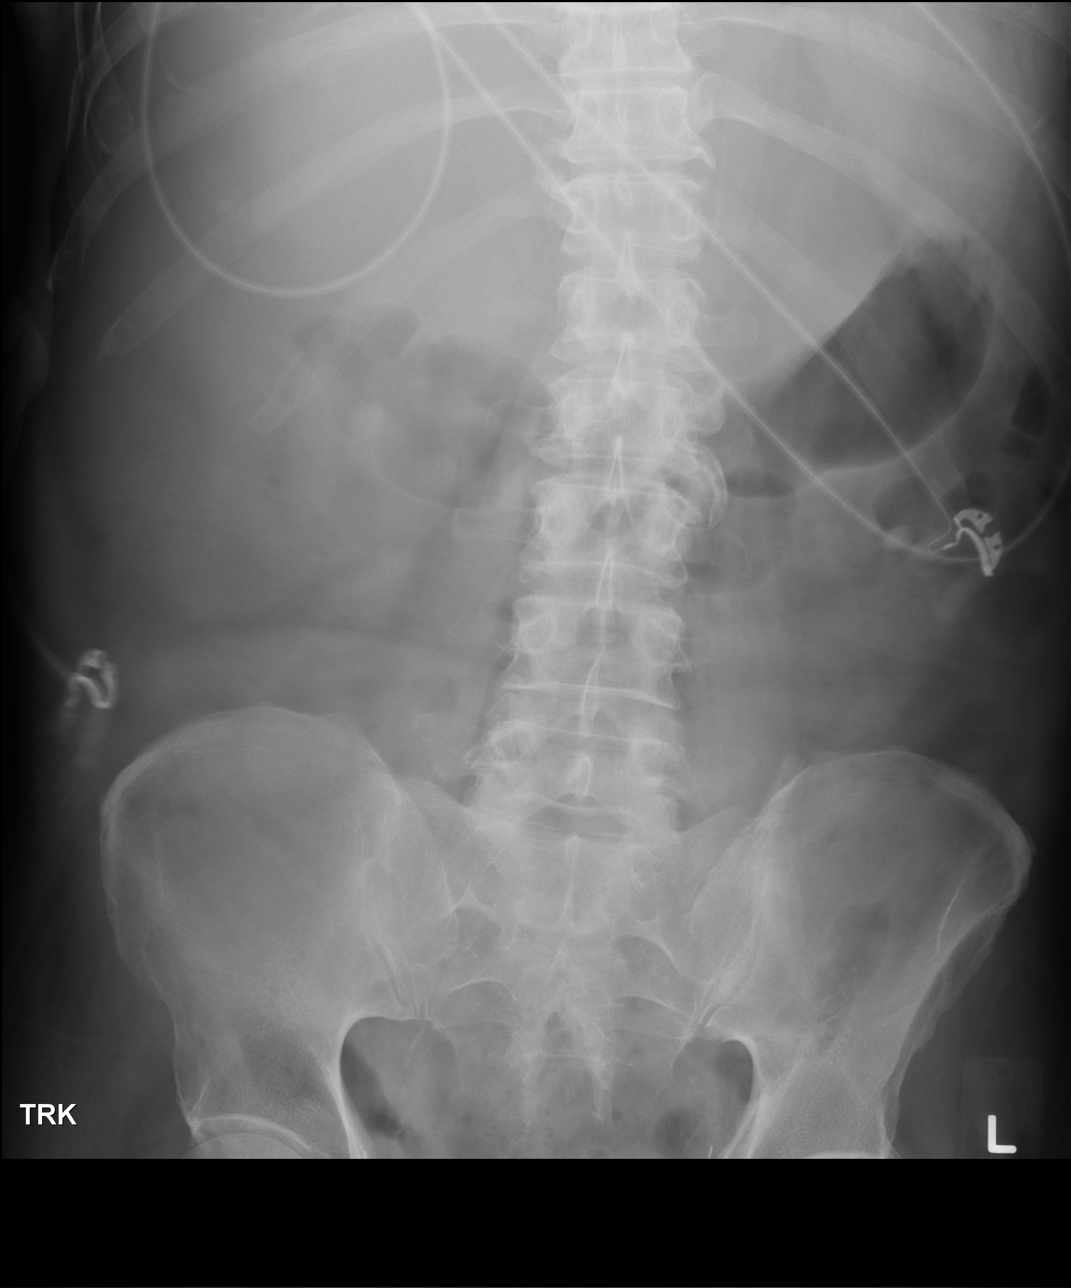

[pa lld]
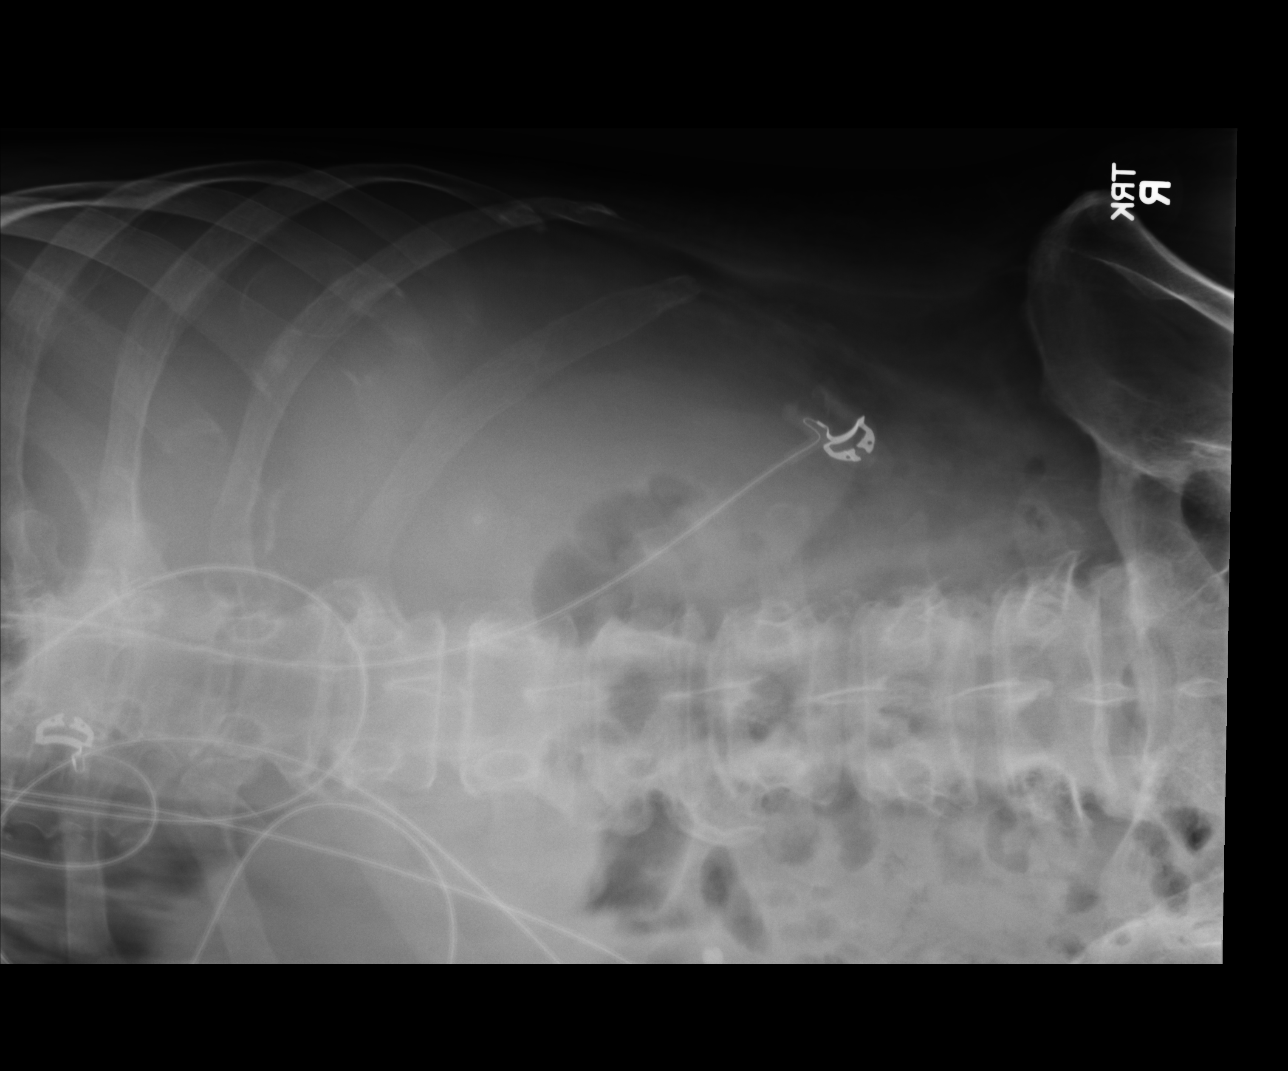

[pa rld]
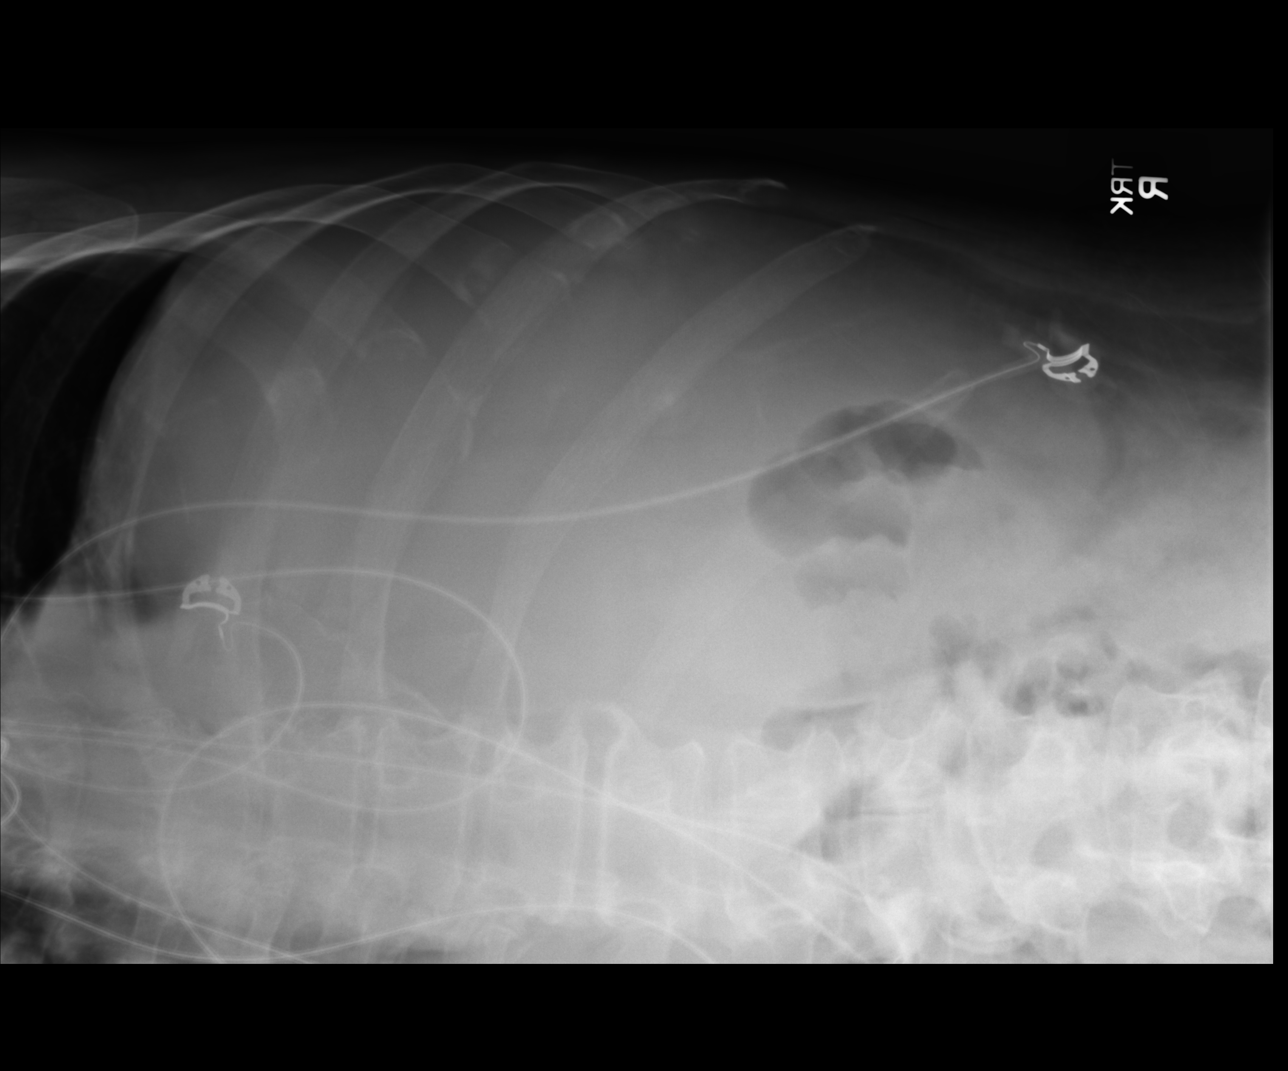

[3 of 3 positions shown; findings below may reference images not displayed]

FINDINGS: Nonspecific bowel gas pattern without plain film evidence
of bowel obstruction or free intraperitoneal air.  CT noted
loculated perihepatic fluid not well delineated by plain film exam.
IMPRESSION: Nonspecific bowel gas pattern without plain film evidence of bowel
obstruction or free intraperitoneal air.

CT noted loculated perihepatic fluid not well delineated by plain
film exam.

## 2013-06-07 IMAGING — RF DG ABDOMEN 1V
1 series · 1 of 1 positions shown · non-contrast
Comparison: 08/12/2012

CLINICAL DATA: Feeding tube placement

ABDOMEN - 1 VIEW

[Series 1: run · 1 of 1 slices shown]
[im 1/1]
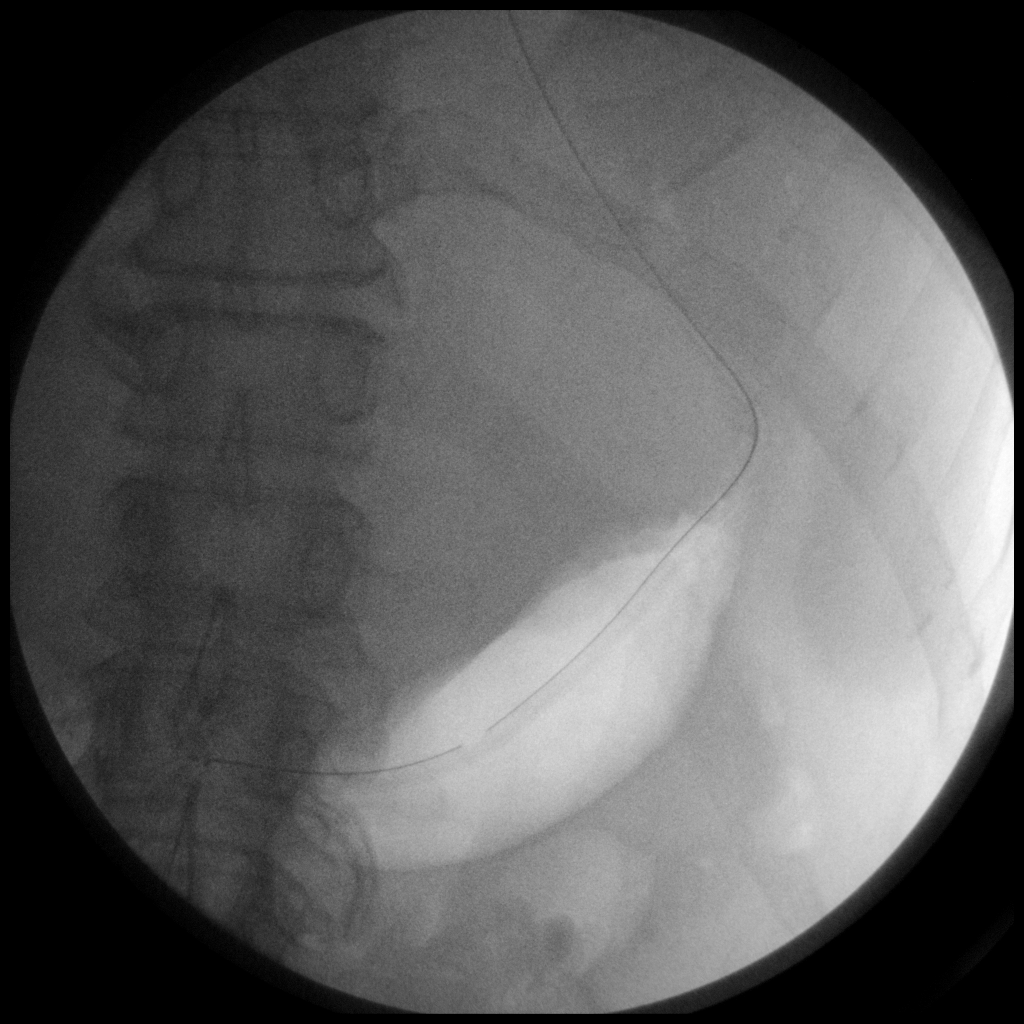

[1 of 1 positions shown; findings below may reference images not displayed]

FINDINGS: NG tube within the stomach, tip is within the distal
antropyloric channel.
IMPRESSION: NG tube in the distal stomach.

## 2013-06-08 IMAGING — CR DG CHEST 1V PORT
2 series · 2 of 2 positions shown · non-contrast
Comparison: [DATE]

CLINICAL DATA: Hypoxia

PORTABLE CHEST - 1 VIEW

[AP (1 of 2)]
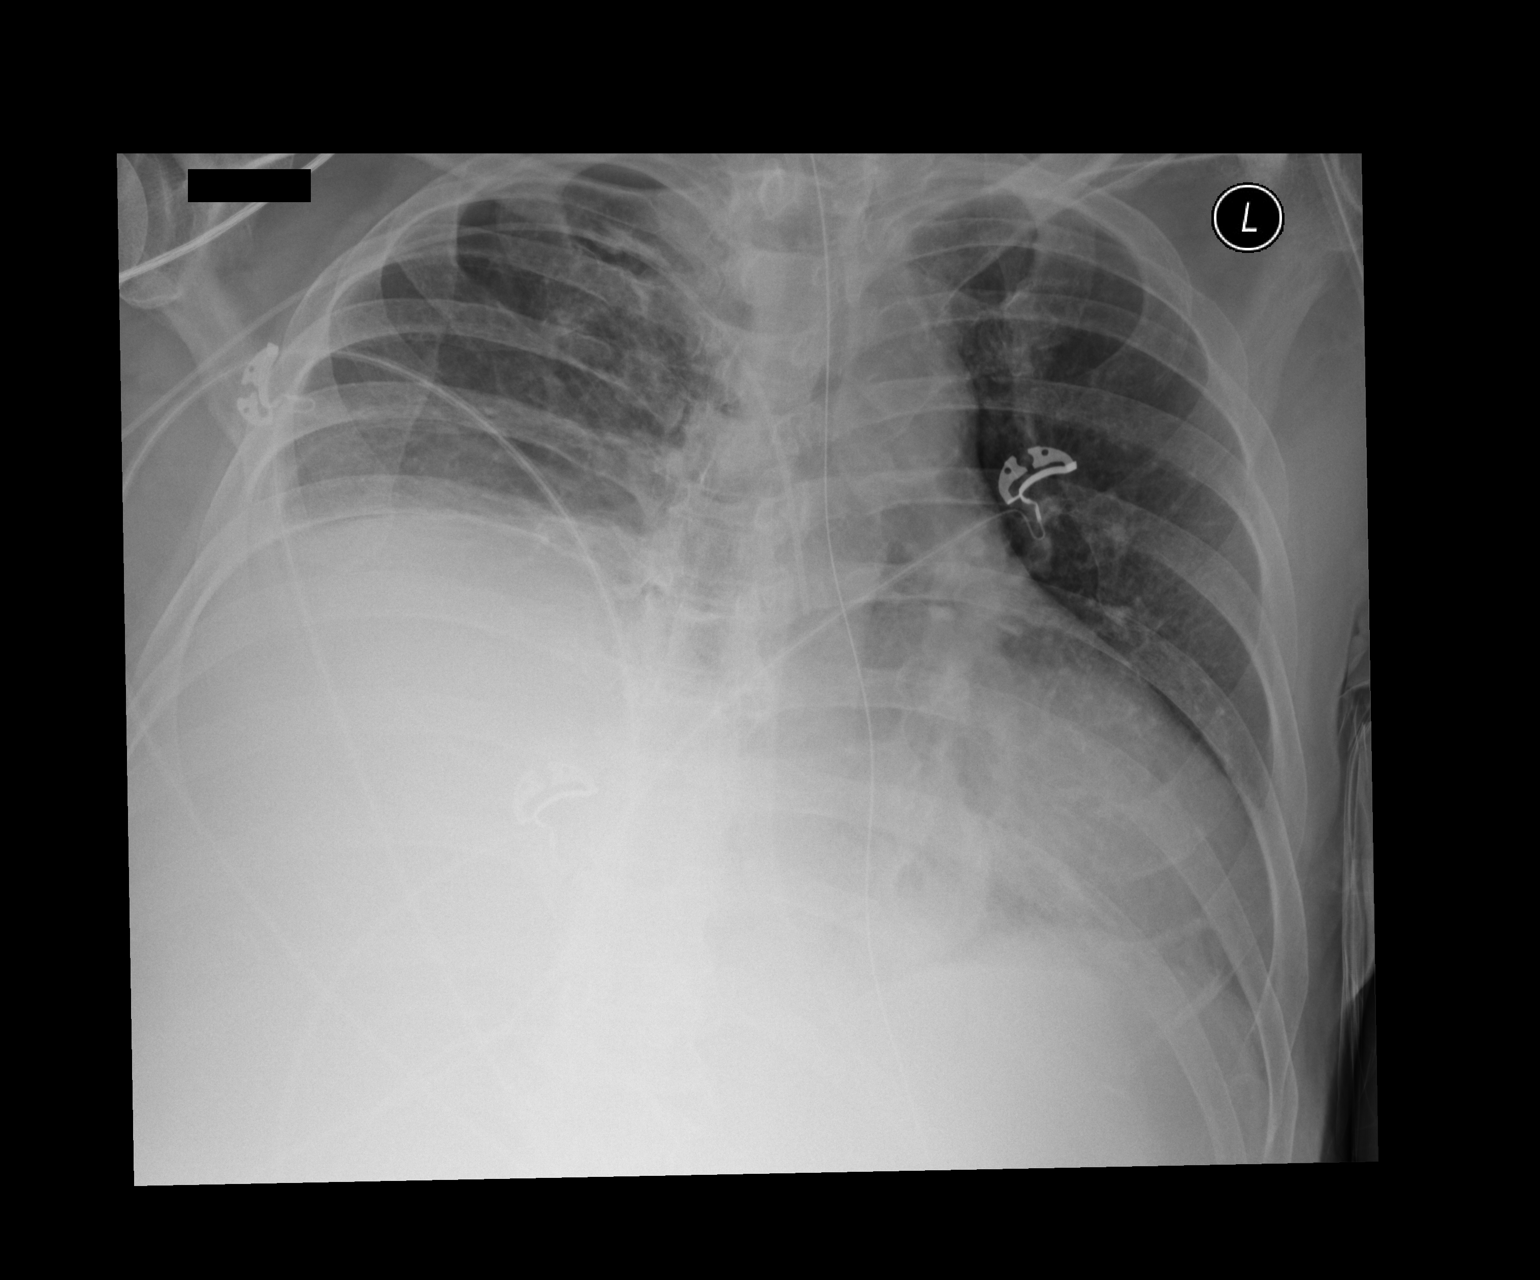

[AP (2 of 2)]
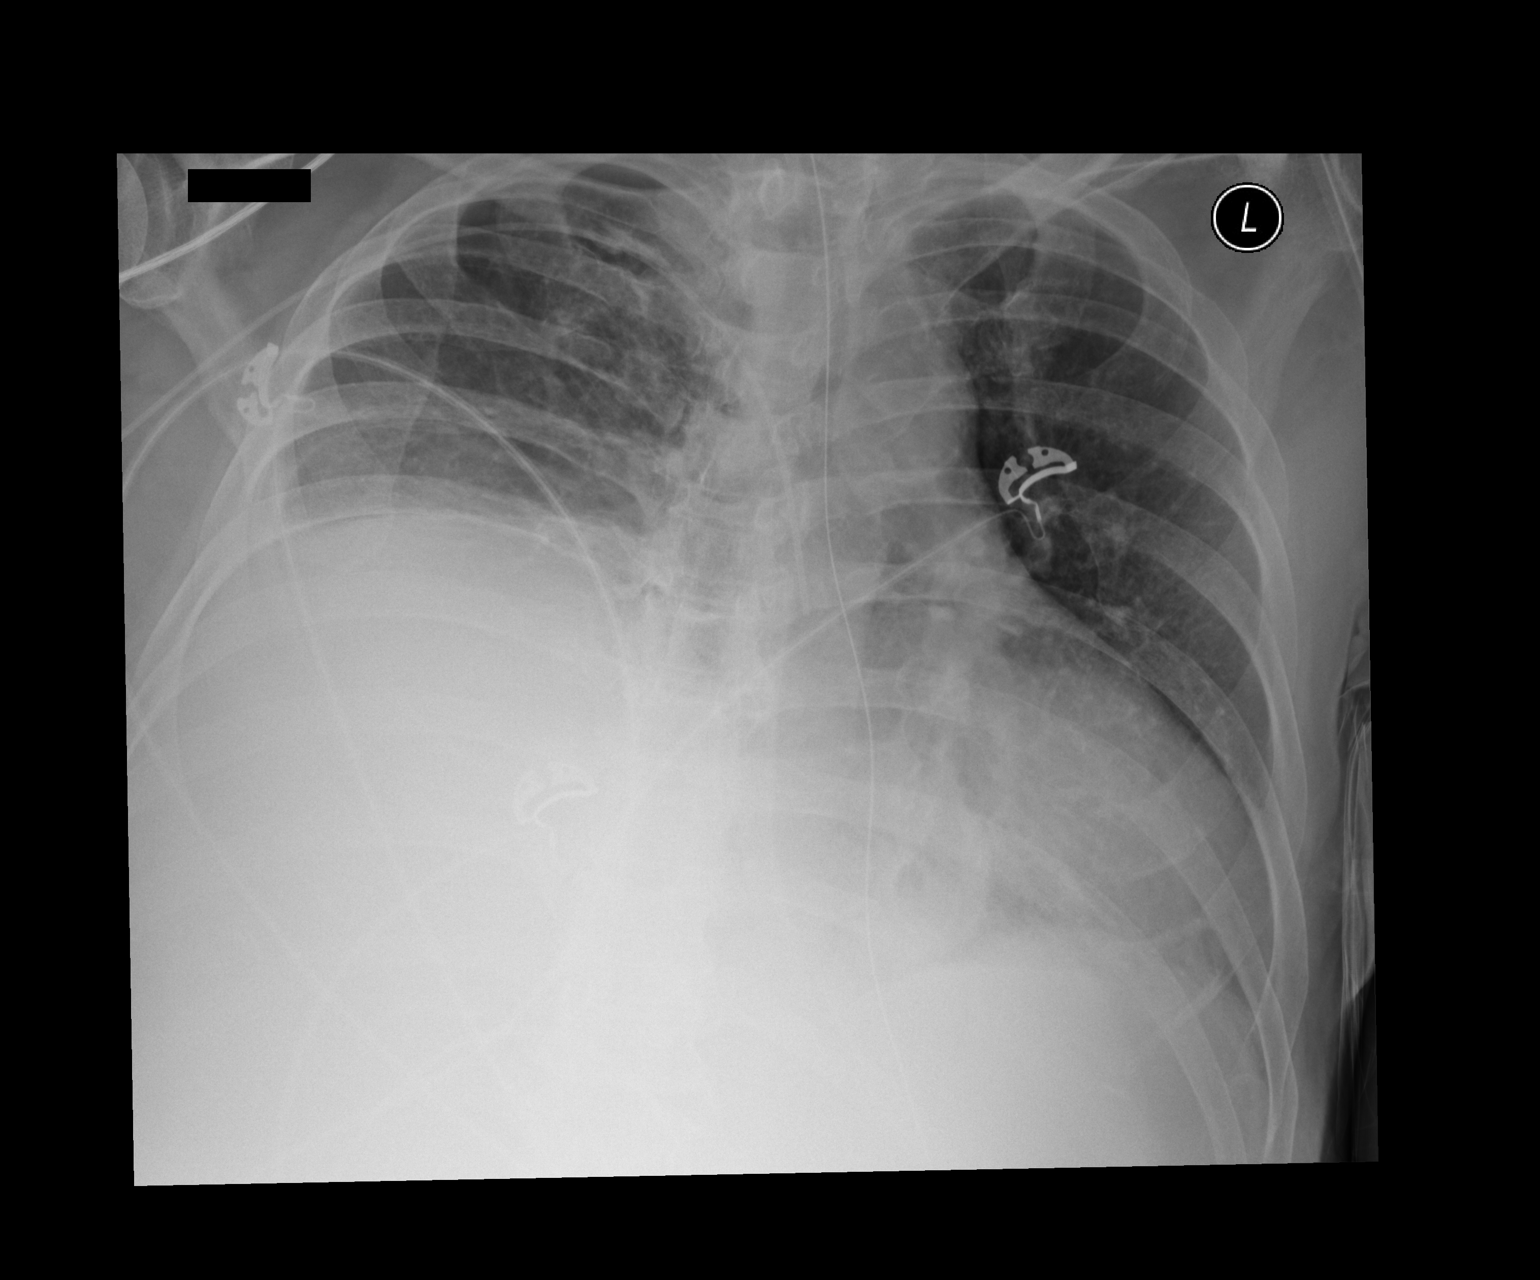

[2 of 2 positions shown; findings below may reference images not displayed]

FINDINGS: Nasogastric tube enters the abdomen.  Right arm PICC has
its tip just above the SVC/RA junction.  There is chronic elevation
of the right hemidiaphragm.  There is worsened
atelectasis/infiltrate in both lower lungs.  Upper lobes remain
clear.
IMPRESSION: Nasogastric tube and right arm PICC well positioned.  Worsening
volume loss/infiltrate in the lower lungs.
